# Patient Record
Sex: Male | Born: 1935 | Race: White | Hispanic: No | Marital: Married | Smoking: Former smoker
Health system: Southern US, Community
[De-identification: ages and names within clinical notes are randomized; demographics above are authoritative.]

## PROBLEM LIST (undated history)

## (undated) DIAGNOSIS — F32A Depression, unspecified: Secondary | ICD-10-CM

## (undated) DIAGNOSIS — IMO0002 Reserved for concepts with insufficient information to code with codable children: Secondary | ICD-10-CM

## (undated) DIAGNOSIS — R972 Elevated prostate specific antigen [PSA]: Secondary | ICD-10-CM

## (undated) DIAGNOSIS — I10 Essential (primary) hypertension: Secondary | ICD-10-CM

## (undated) DIAGNOSIS — E785 Hyperlipidemia, unspecified: Secondary | ICD-10-CM

## (undated) DIAGNOSIS — I219 Acute myocardial infarction, unspecified: Secondary | ICD-10-CM

## (undated) DIAGNOSIS — F329 Major depressive disorder, single episode, unspecified: Secondary | ICD-10-CM

## (undated) DIAGNOSIS — F419 Anxiety disorder, unspecified: Secondary | ICD-10-CM

## (undated) DIAGNOSIS — I2510 Atherosclerotic heart disease of native coronary artery without angina pectoris: Secondary | ICD-10-CM

## (undated) DIAGNOSIS — R011 Cardiac murmur, unspecified: Secondary | ICD-10-CM

## (undated) DIAGNOSIS — T7840XA Allergy, unspecified, initial encounter: Secondary | ICD-10-CM

## (undated) HISTORY — DX: Reserved for concepts with insufficient information to code with codable children: IMO0002

## (undated) HISTORY — DX: Cardiac murmur, unspecified: R01.1

## (undated) HISTORY — DX: Depression, unspecified: F32.A

## (undated) HISTORY — DX: Allergy, unspecified, initial encounter: T78.40XA

## (undated) HISTORY — DX: Elevated prostate specific antigen (PSA): R97.20

## (undated) HISTORY — DX: Hyperlipidemia, unspecified: E78.5

## (undated) HISTORY — PX: CARDIAC CATHETERIZATION: SHX172

## (undated) HISTORY — DX: Essential (primary) hypertension: I10

## (undated) HISTORY — DX: Major depressive disorder, single episode, unspecified: F32.9

## (undated) HISTORY — DX: Anxiety disorder, unspecified: F41.9

## (undated) HISTORY — DX: Atherosclerotic heart disease of native coronary artery without angina pectoris: I25.10

## (undated) HISTORY — DX: Acute myocardial infarction, unspecified: I21.9

---
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
## 1999-08-16 HISTORY — PX: CHOLECYSTECTOMY: SHX55

## 2003-08-16 HISTORY — PX: CORONARY ANGIOPLASTY: SHX604

## 2007-08-16 DIAGNOSIS — I219 Acute myocardial infarction, unspecified: Secondary | ICD-10-CM

## 2007-08-16 HISTORY — DX: Acute myocardial infarction, unspecified: I21.9

## 2007-08-16 HISTORY — PX: CORONARY ARTERY BYPASS GRAFT: SHX141

## 2008-01-28 ENCOUNTER — Other Ambulatory Visit: Payer: Self-pay

## 2008-01-28 ENCOUNTER — Inpatient Hospital Stay: Payer: Self-pay | Admitting: Cardiovascular Disease

## 2008-03-19 ENCOUNTER — Encounter: Payer: Self-pay | Admitting: Cardiology

## 2008-04-15 ENCOUNTER — Encounter: Payer: Self-pay | Admitting: Cardiology

## 2008-05-15 ENCOUNTER — Encounter: Payer: Self-pay | Admitting: Cardiology

## 2008-06-15 ENCOUNTER — Encounter: Payer: Self-pay | Admitting: Cardiology

## 2011-01-14 LAB — HM COLONOSCOPY

## 2011-08-24 DIAGNOSIS — R3129 Other microscopic hematuria: Secondary | ICD-10-CM | POA: Diagnosis not present

## 2011-08-24 DIAGNOSIS — Z79899 Other long term (current) drug therapy: Secondary | ICD-10-CM | POA: Diagnosis not present

## 2011-08-24 DIAGNOSIS — E7800 Pure hypercholesterolemia, unspecified: Secondary | ICD-10-CM | POA: Diagnosis not present

## 2011-08-24 DIAGNOSIS — Z125 Encounter for screening for malignant neoplasm of prostate: Secondary | ICD-10-CM | POA: Diagnosis not present

## 2011-08-26 DIAGNOSIS — Z1211 Encounter for screening for malignant neoplasm of colon: Secondary | ICD-10-CM | POA: Diagnosis not present

## 2011-08-26 DIAGNOSIS — Z0000 Encounter for general adult medical examination without abnormal findings: Secondary | ICD-10-CM | POA: Diagnosis not present

## 2011-08-26 DIAGNOSIS — E7800 Pure hypercholesterolemia, unspecified: Secondary | ICD-10-CM | POA: Diagnosis not present

## 2011-08-26 DIAGNOSIS — Z951 Presence of aortocoronary bypass graft: Secondary | ICD-10-CM | POA: Diagnosis not present

## 2011-08-26 DIAGNOSIS — N529 Male erectile dysfunction, unspecified: Secondary | ICD-10-CM | POA: Diagnosis not present

## 2011-08-26 DIAGNOSIS — R972 Elevated prostate specific antigen [PSA]: Secondary | ICD-10-CM | POA: Diagnosis not present

## 2011-08-26 DIAGNOSIS — Z125 Encounter for screening for malignant neoplasm of prostate: Secondary | ICD-10-CM | POA: Diagnosis not present

## 2011-09-07 DIAGNOSIS — N401 Enlarged prostate with lower urinary tract symptoms: Secondary | ICD-10-CM | POA: Diagnosis not present

## 2011-09-07 DIAGNOSIS — R972 Elevated prostate specific antigen [PSA]: Secondary | ICD-10-CM | POA: Diagnosis not present

## 2011-09-07 DIAGNOSIS — N138 Other obstructive and reflux uropathy: Secondary | ICD-10-CM | POA: Diagnosis not present

## 2011-12-12 DIAGNOSIS — H1045 Other chronic allergic conjunctivitis: Secondary | ICD-10-CM | POA: Diagnosis not present

## 2012-02-27 DIAGNOSIS — D485 Neoplasm of uncertain behavior of skin: Secondary | ICD-10-CM | POA: Diagnosis not present

## 2012-02-27 DIAGNOSIS — C44319 Basal cell carcinoma of skin of other parts of face: Secondary | ICD-10-CM | POA: Diagnosis not present

## 2012-03-07 DIAGNOSIS — N138 Other obstructive and reflux uropathy: Secondary | ICD-10-CM | POA: Diagnosis not present

## 2012-03-07 DIAGNOSIS — N401 Enlarged prostate with lower urinary tract symptoms: Secondary | ICD-10-CM | POA: Diagnosis not present

## 2012-03-07 DIAGNOSIS — R972 Elevated prostate specific antigen [PSA]: Secondary | ICD-10-CM | POA: Diagnosis not present

## 2012-03-15 DIAGNOSIS — C44319 Basal cell carcinoma of skin of other parts of face: Secondary | ICD-10-CM | POA: Diagnosis not present

## 2012-05-29 DIAGNOSIS — Z23 Encounter for immunization: Secondary | ICD-10-CM | POA: Diagnosis not present

## 2012-08-17 DIAGNOSIS — M25559 Pain in unspecified hip: Secondary | ICD-10-CM | POA: Diagnosis not present

## 2012-09-05 DIAGNOSIS — R972 Elevated prostate specific antigen [PSA]: Secondary | ICD-10-CM | POA: Diagnosis not present

## 2012-09-05 DIAGNOSIS — N138 Other obstructive and reflux uropathy: Secondary | ICD-10-CM | POA: Diagnosis not present

## 2012-09-05 DIAGNOSIS — N401 Enlarged prostate with lower urinary tract symptoms: Secondary | ICD-10-CM | POA: Diagnosis not present

## 2012-10-01 ENCOUNTER — Observation Stay: Payer: Self-pay | Admitting: Specialist

## 2012-10-01 DIAGNOSIS — Z7982 Long term (current) use of aspirin: Secondary | ICD-10-CM | POA: Diagnosis not present

## 2012-10-01 DIAGNOSIS — I10 Essential (primary) hypertension: Secondary | ICD-10-CM | POA: Diagnosis not present

## 2012-10-01 DIAGNOSIS — I2510 Atherosclerotic heart disease of native coronary artery without angina pectoris: Secondary | ICD-10-CM | POA: Diagnosis not present

## 2012-10-01 DIAGNOSIS — I200 Unstable angina: Secondary | ICD-10-CM | POA: Diagnosis not present

## 2012-10-01 DIAGNOSIS — R0789 Other chest pain: Secondary | ICD-10-CM | POA: Diagnosis not present

## 2012-10-01 DIAGNOSIS — F329 Major depressive disorder, single episode, unspecified: Secondary | ICD-10-CM | POA: Diagnosis not present

## 2012-10-01 DIAGNOSIS — Z951 Presence of aortocoronary bypass graft: Secondary | ICD-10-CM | POA: Diagnosis not present

## 2012-10-01 DIAGNOSIS — E785 Hyperlipidemia, unspecified: Secondary | ICD-10-CM | POA: Diagnosis not present

## 2012-10-01 DIAGNOSIS — R079 Chest pain, unspecified: Secondary | ICD-10-CM | POA: Diagnosis not present

## 2012-10-01 DIAGNOSIS — Z8249 Family history of ischemic heart disease and other diseases of the circulatory system: Secondary | ICD-10-CM | POA: Diagnosis not present

## 2012-10-01 DIAGNOSIS — Z79899 Other long term (current) drug therapy: Secondary | ICD-10-CM | POA: Diagnosis not present

## 2012-10-01 DIAGNOSIS — K219 Gastro-esophageal reflux disease without esophagitis: Secondary | ICD-10-CM | POA: Diagnosis not present

## 2012-10-01 LAB — PROTIME-INR
INR: 1
Prothrombin Time: 13.3 secs (ref 11.5–14.7)

## 2012-10-01 LAB — COMPREHENSIVE METABOLIC PANEL
Albumin: 3.9 g/dL (ref 3.4–5.0)
Alkaline Phosphatase: 80 U/L (ref 50–136)
Anion Gap: 6 — ABNORMAL LOW (ref 7–16)
BUN: 20 mg/dL — ABNORMAL HIGH (ref 7–18)
Bilirubin,Total: 0.5 mg/dL (ref 0.2–1.0)
Calcium, Total: 8.9 mg/dL (ref 8.5–10.1)
Chloride: 107 mmol/L (ref 98–107)
Co2: 26 mmol/L (ref 21–32)
Creatinine: 1.12 mg/dL (ref 0.60–1.30)
EGFR (African American): >60
EGFR (Non-African Amer.): >60
Glucose: 92 mg/dL (ref 65–99)
Osmolality: 280 (ref 275–301)
Potassium: 4.4 mmol/L (ref 3.5–5.1)
SGOT(AST): 30 U/L (ref 15–37)
SGPT (ALT): 27 U/L (ref 12–78)
Sodium: 139 mmol/L (ref 136–145)
Total Protein: 7.8 g/dL (ref 6.4–8.2)

## 2012-10-01 LAB — LIPID PANEL
Cholesterol: 148 mg/dL (ref 0–200)
HDL Cholesterol: 49 mg/dL (ref 40–60)
Ldl Cholesterol, Calc: 81 mg/dL (ref 0–100)
Triglycerides: 89 mg/dL (ref 0–200)
VLDL Cholesterol, Calc: 18 mg/dL (ref 5–40)

## 2012-10-01 LAB — APTT: Activated PTT: 29.6 secs (ref 23.6–35.9)

## 2012-10-01 LAB — CBC
HCT: 40 % (ref 40.0–52.0)
HGB: 13.2 g/dL (ref 13.0–18.0)
MCH: 31.8 pg (ref 26.0–34.0)
MCHC: 32.9 g/dL (ref 32.0–36.0)
MCV: 97 fL (ref 80–100)
Platelet: 266 10*3/uL (ref 150–440)
RBC: 4.13 10*6/uL — ABNORMAL LOW (ref 4.40–5.90)
RDW: 13.4 % (ref 11.5–14.5)
WBC: 10.2 10*3/uL (ref 3.8–10.6)

## 2012-10-01 LAB — CK TOTAL AND CKMB (NOT AT ARMC)
CK, Total: 138 U/L (ref 35–232)
CK-MB: 3.6 ng/mL (ref 0.5–3.6)

## 2012-10-01 LAB — TROPONIN I
Troponin-I: <0.02 ng/mL
Troponin-I: <0.02 ng/mL

## 2012-10-02 ENCOUNTER — Telehealth: Payer: Self-pay

## 2012-10-02 DIAGNOSIS — E785 Hyperlipidemia, unspecified: Secondary | ICD-10-CM | POA: Diagnosis not present

## 2012-10-02 DIAGNOSIS — I200 Unstable angina: Secondary | ICD-10-CM | POA: Diagnosis not present

## 2012-10-02 DIAGNOSIS — I10 Essential (primary) hypertension: Secondary | ICD-10-CM | POA: Diagnosis not present

## 2012-10-02 DIAGNOSIS — R079 Chest pain, unspecified: Secondary | ICD-10-CM | POA: Diagnosis not present

## 2012-10-02 DIAGNOSIS — I2510 Atherosclerotic heart disease of native coronary artery without angina pectoris: Secondary | ICD-10-CM | POA: Diagnosis not present

## 2012-10-02 LAB — BASIC METABOLIC PANEL
Anion Gap: 7 (ref 7–16)
BUN: 18 mg/dL (ref 7–18)
Calcium, Total: 8.7 mg/dL (ref 8.5–10.1)
Chloride: 108 mmol/L — ABNORMAL HIGH (ref 98–107)
Co2: 27 mmol/L (ref 21–32)
Creatinine: 1.17 mg/dL (ref 0.60–1.30)
EGFR (African American): >60
EGFR (Non-African Amer.): >60
Glucose: 101 mg/dL — ABNORMAL HIGH (ref 65–99)
Osmolality: 285 (ref 275–301)
Potassium: 4.1 mmol/L (ref 3.5–5.1)
Sodium: 142 mmol/L (ref 136–145)

## 2012-10-02 LAB — TROPONIN I: Troponin-I: <0.02 ng/mL

## 2012-10-02 LAB — CBC WITH DIFFERENTIAL/PLATELET
Basophil #: 0.1 10*3/uL (ref 0.0–0.1)
Basophil %: 1 %
Eosinophil #: 0.6 10*3/uL (ref 0.0–0.7)
Eosinophil %: 7.7 %
HCT: 38.4 % — ABNORMAL LOW (ref 40.0–52.0)
HGB: 12.8 g/dL — ABNORMAL LOW (ref 13.0–18.0)
Lymphocyte #: 1.5 10*3/uL (ref 1.0–3.6)
Lymphocyte %: 20 %
MCH: 32.2 pg (ref 26.0–34.0)
MCHC: 33.4 g/dL (ref 32.0–36.0)
MCV: 97 fL (ref 80–100)
Monocyte #: 0.6 x10 3/mm (ref 0.2–1.0)
Monocyte %: 7.4 %
Neutrophil #: 4.9 10*3/uL (ref 1.4–6.5)
Neutrophil %: 63.9 %
Platelet: 249 10*3/uL (ref 150–440)
RBC: 3.98 10*6/uL — ABNORMAL LOW (ref 4.40–5.90)
RDW: 13.2 % (ref 11.5–14.5)
WBC: 7.6 10*3/uL (ref 3.8–10.6)

## 2012-10-02 LAB — CK TOTAL AND CKMB (NOT AT ARMC)
CK, Total: 135 U/L (ref 35–232)
CK-MB: 2.8 ng/mL (ref 0.5–3.6)

## 2012-10-02 NOTE — Telephone Encounter (Signed)
TCM call

## 2012-10-02 NOTE — Telephone Encounter (Signed)
Message copied by Marcelle Overlie on Tue Oct 02, 2012  4:04 PM ------      Message from: Thersa Salt      Created: Tue Oct 02, 2012  1:36 PM      Regarding: tcm        Pt has appt 10/12/12 ------

## 2012-10-03 NOTE — Telephone Encounter (Signed)
TCM call Pt says he is feeling well post d./c for CP Denies further CP, SOB Confirms compliance with meds as prescribed Confirms knowledge of appt with [place] 2/28

## 2012-10-11 ENCOUNTER — Encounter: Payer: Self-pay | Admitting: Cardiovascular Disease

## 2012-10-11 ENCOUNTER — Ambulatory Visit (INDEPENDENT_AMBULATORY_CARE_PROVIDER_SITE_OTHER): Payer: Medicare PPO | Admitting: Cardiovascular Disease

## 2012-10-11 VITALS — BP 150/60 | HR 65 | Ht 73.0 in | Wt 197.0 lb

## 2012-10-11 DIAGNOSIS — E782 Mixed hyperlipidemia: Secondary | ICD-10-CM | POA: Insufficient documentation

## 2012-10-11 DIAGNOSIS — R0789 Other chest pain: Secondary | ICD-10-CM

## 2012-10-11 DIAGNOSIS — Z951 Presence of aortocoronary bypass graft: Secondary | ICD-10-CM | POA: Insufficient documentation

## 2012-10-11 DIAGNOSIS — I10 Essential (primary) hypertension: Secondary | ICD-10-CM | POA: Insufficient documentation

## 2012-10-11 DIAGNOSIS — I2510 Atherosclerotic heart disease of native coronary artery without angina pectoris: Secondary | ICD-10-CM | POA: Insufficient documentation

## 2012-10-11 DIAGNOSIS — E785 Hyperlipidemia, unspecified: Secondary | ICD-10-CM

## 2012-10-11 NOTE — Progress Notes (Signed)
Patient ID: Rodney Delacruz, male    DOB: 05-Oct-1933, 79 y.o.   MRN: [id number]  HPI Comments: 79 year old male with coronary artery disease, prior PCI and bypass surgery in 2008 at [facility], presenting to [facility] for chest pain on 10/30/2012. He ruled out for MI and had a Myoview study that showed no ischemia. He presents today for followup.he reports having a history of GERD.   Since his discharge, he has continued to haveSome dullness in his chest, described as a pressure. Typically occurs at rest, not with exertion. He does do exercise on a regular basis with no symptoms. Chest is somewhat tender to palpation sometimes.uncertain if symptoms are from GERD, cardiac or something else  Total cholesterol 148, LDL 81, HDL 49  Cardiac catheterization June 2009 The shows 90% distal left main disease, 70% ostial LAD disease, 100% mid LAD at the site of a prior stent, 100% distal LAD disease at the site of prior stent, D1 with 60% disease, proximal circumflex with 40% disease, mid circumflex with 50%, OM1 with 30% disease, proximal RCA with 30% followed by 40% followed by 60% disease, 50% distal PDA disease  EKG shows normal sinus rhythm with rate 65 beats per minute with no significant ST or T wave changes  Outpatient Encounter Prescriptions as of 10/11/2012  Medication Sig Dispense Refill  . aspirin 81 MG tablet Take 81 mg by mouth daily.      Marland Kitchen buPROPion (WELLBUTRIN XL) 300 MG 24 hr tablet Take 300 mg by mouth daily.      . lansoprazole (PREVACID) 30 MG capsule Take 30 mg by mouth daily.      Marland Kitchen lovastatin (MEVACOR) 40 MG tablet Take 40 mg by mouth at bedtime.      . meloxicam (MOBIC) 15 MG tablet Take 15 mg by mouth 2 (two) times daily.       . metoprolol succinate (TOPROL-XL) 25 MG 24 hr tablet Take 25 mg by mouth daily.      . Multiple Vitamin (MULTI-VITAMIN DAILY PO) Take by mouth.      . nitroGLYCERIN (NITROSTAT) 0.4 MG SL tablet Place 0.4 mg under the tongue every 5 (five) minutes as needed for  chest pain.      . ramipril (ALTACE) 2.5 MG capsule Take 2.5 mg by mouth daily.       No facility-administered encounter medications on file as of 10/11/2012.      Review of Systems  Constitutional: Negative.   HENT: Negative.   Eyes: Negative.   Respiratory: Positive for chest tightness.        At rest  Cardiovascular: Negative.   Gastrointestinal: Negative.   Musculoskeletal: Negative.   Skin: Negative.   Neurological: Negative.   Psychiatric/Behavioral: Negative.   All other systems reviewed and are negative.    BP 150/60  Pulse 65  Ht 6\' 1"  (1.854 m)  Wt 197 lb (89.359 kg)  BMI 26 kg/m2 He reports blood pressure is better controlled at home with systolic in the 130s  Physical Exam  Nursing note and vitals reviewed. Constitutional: He is oriented to person, place, and time. He appears well-developed and well-nourished.  HENT:  Head: Normocephalic.  Nose: Nose normal.  Mouth/Throat: Oropharynx is clear and moist.  Eyes: Conjunctivae are normal. Pupils are equal, round, and reactive to light.  Neck: Normal range of motion. Neck supple. No JVD present.  Cardiovascular: Normal rate, regular rhythm, S1 normal, S2 normal, normal heart sounds and intact distal pulses.  Exam reveals no  gallop and no friction rub.   No murmur heard. Pulmonary/Chest: Effort normal and breath sounds normal. No respiratory distress. He has no wheezes. He has no rales. He exhibits no tenderness.  Abdominal: Soft. Bowel sounds are normal. He exhibits no distension. There is no tenderness.  Musculoskeletal: Normal range of motion. He exhibits no edema and no tenderness.  Lymphadenopathy:    He has no cervical adenopathy.  Neurological: He is alert and oriented to person, place, and time. Coordination normal.  Skin: Skin is warm and dry. No rash noted. No erythema.  Psychiatric: He has a normal mood and affect. His behavior is normal. Judgment and thought content normal.      Assessment and  Plan

## 2012-10-11 NOTE — Assessment & Plan Note (Signed)
Three-vessel disease as detailed above

## 2012-10-11 NOTE — Assessment & Plan Note (Signed)
Severe three-vessel disease by cardiac catheterization notes dated June 2009. Recent negative stress test. Continue aggressive cholesterol management

## 2012-10-11 NOTE — Patient Instructions (Addendum)
You are doing well. Please try extra pepcid or zantac as needed for stomach Ok to take two of the prevacid  Please call us if you have new issues that need to be addressed before your next appt.  Your physician wants you to follow-up in: 6 months.  You will receive a reminder letter in the mail two months in advance. If you don't receive a letter, please call our office to schedule the follow-up appointment.

## 2012-10-11 NOTE — Assessment & Plan Note (Signed)
Severe three-vessel disease. Bypass surgery in 2008. Recent negative stress test. Continue aggressive cholesterol management.

## 2012-10-11 NOTE — Assessment & Plan Note (Signed)
Chest tightness is atypical in nature, comes on at rest. Recent negative stress test. We have recommended he increase his proton pump inhibitor to twice a day dosing. Also supplement with Zantac and TUMS. He could try nonsteroidal anti-inflammatories for possible costochondritis.

## 2012-10-11 NOTE — Assessment & Plan Note (Addendum)
Cholesterol at or close to goal on current medication regimen.

## 2012-10-12 ENCOUNTER — Encounter: Payer: Medicare PPO | Admitting: Cardiovascular Disease

## 2012-10-12 ENCOUNTER — Encounter: Payer: Self-pay | Admitting: Cardiovascular Disease

## 2012-10-22 ENCOUNTER — Other Ambulatory Visit: Payer: Self-pay | Admitting: *Deleted

## 2012-10-22 DIAGNOSIS — R972 Elevated prostate specific antigen [PSA]: Secondary | ICD-10-CM | POA: Diagnosis not present

## 2012-10-22 MED ORDER — RAMIPRIL 2.5 MG PO CAPS: 2.5000 mg | ORAL_CAPSULE | Freq: Every day | ORAL | Status: DC

## 2012-10-22 MED ORDER — LANSOPRAZOLE 30 MG PO CPDR: 30.0000 mg | DELAYED_RELEASE_CAPSULE | Freq: Every day | ORAL | Status: DC

## 2012-10-22 MED ORDER — NITROGLYCERIN 0.4 MG SL SUBL: 0.4000 mg | SUBLINGUAL_TABLET | SUBLINGUAL | Status: DC | PRN

## 2012-10-22 MED ORDER — METOPROLOL SUCCINATE ER 25 MG PO TB24: 25.0000 mg | ORAL_TABLET | Freq: Every day | ORAL | Status: DC

## 2012-10-22 MED ORDER — LOVASTATIN 40 MG PO TABS: 40.0000 mg | ORAL_TABLET | Freq: Every day | ORAL | Status: DC

## 2012-10-22 NOTE — Telephone Encounter (Signed)
Refilled Metoprolol, Nitro, Ramiprel, Lovastatin and Prevacid sent to [facility].

## 2012-11-01 DIAGNOSIS — I2510 Atherosclerotic heart disease of native coronary artery without angina pectoris: Secondary | ICD-10-CM | POA: Diagnosis not present

## 2012-11-01 DIAGNOSIS — Z951 Presence of aortocoronary bypass graft: Secondary | ICD-10-CM | POA: Diagnosis not present

## 2012-11-01 DIAGNOSIS — R0789 Other chest pain: Secondary | ICD-10-CM | POA: Diagnosis not present

## 2012-11-01 DIAGNOSIS — E785 Hyperlipidemia, unspecified: Secondary | ICD-10-CM | POA: Diagnosis not present

## 2012-11-01 DIAGNOSIS — I10 Essential (primary) hypertension: Secondary | ICD-10-CM

## 2012-11-05 DIAGNOSIS — H43819 Vitreous degeneration, unspecified eye: Secondary | ICD-10-CM | POA: Diagnosis not present

## 2012-11-12 DIAGNOSIS — H33309 Unspecified retinal break, unspecified eye: Secondary | ICD-10-CM | POA: Diagnosis not present

## 2012-11-23 DIAGNOSIS — M76899 Other specified enthesopathies of unspecified lower limb, excluding foot: Secondary | ICD-10-CM | POA: Diagnosis not present

## 2012-12-10 DIAGNOSIS — H33309 Unspecified retinal break, unspecified eye: Secondary | ICD-10-CM | POA: Diagnosis not present

## 2013-01-09 DIAGNOSIS — L570 Actinic keratosis: Secondary | ICD-10-CM | POA: Diagnosis not present

## 2013-01-09 DIAGNOSIS — C44319 Basal cell carcinoma of skin of other parts of face: Secondary | ICD-10-CM | POA: Diagnosis not present

## 2013-01-09 DIAGNOSIS — D485 Neoplasm of uncertain behavior of skin: Secondary | ICD-10-CM | POA: Diagnosis not present

## 2013-01-16 DIAGNOSIS — F331 Major depressive disorder, recurrent, moderate: Secondary | ICD-10-CM | POA: Diagnosis not present

## 2013-01-24 DIAGNOSIS — C44319 Basal cell carcinoma of skin of other parts of face: Secondary | ICD-10-CM | POA: Diagnosis not present

## 2013-02-18 DIAGNOSIS — H33309 Unspecified retinal break, unspecified eye: Secondary | ICD-10-CM | POA: Diagnosis not present

## 2013-03-06 DIAGNOSIS — R972 Elevated prostate specific antigen [PSA]: Secondary | ICD-10-CM | POA: Diagnosis not present

## 2013-03-06 DIAGNOSIS — N401 Enlarged prostate with lower urinary tract symptoms: Secondary | ICD-10-CM | POA: Diagnosis not present

## 2013-03-06 DIAGNOSIS — N529 Male erectile dysfunction, unspecified: Secondary | ICD-10-CM | POA: Diagnosis not present

## 2013-03-06 DIAGNOSIS — N138 Other obstructive and reflux uropathy: Secondary | ICD-10-CM | POA: Diagnosis not present

## 2013-03-20 ENCOUNTER — Other Ambulatory Visit: Payer: Self-pay

## 2013-04-29 DIAGNOSIS — C44319 Basal cell carcinoma of skin of other parts of face: Secondary | ICD-10-CM | POA: Diagnosis not present

## 2013-04-29 DIAGNOSIS — D485 Neoplasm of uncertain behavior of skin: Secondary | ICD-10-CM | POA: Diagnosis not present

## 2013-05-17 ENCOUNTER — Other Ambulatory Visit: Payer: Self-pay | Admitting: *Deleted

## 2013-05-17 ENCOUNTER — Other Ambulatory Visit: Payer: Self-pay | Admitting: Cardiovascular Disease

## 2013-05-17 MED ORDER — LANSOPRAZOLE 30 MG PO CPDR: 30.0000 mg | DELAYED_RELEASE_CAPSULE | Freq: Every day | ORAL | Status: DC

## 2013-05-17 MED ORDER — LOVASTATIN 40 MG PO TABS: 40.0000 mg | ORAL_TABLET | Freq: Every day | ORAL | Status: DC

## 2013-05-17 NOTE — Telephone Encounter (Signed)
Refilled mevacor and prevacid sent to [facility] pharmacy.

## 2013-05-27 DIAGNOSIS — H43819 Vitreous degeneration, unspecified eye: Secondary | ICD-10-CM | POA: Diagnosis not present

## 2013-06-03 ENCOUNTER — Other Ambulatory Visit: Payer: Self-pay | Admitting: Cardiovascular Disease

## 2013-06-03 DIAGNOSIS — Z23 Encounter for immunization: Secondary | ICD-10-CM | POA: Diagnosis not present

## 2013-06-17 ENCOUNTER — Ambulatory Visit (INDEPENDENT_AMBULATORY_CARE_PROVIDER_SITE_OTHER): Payer: Medicare Other | Admitting: Cardiovascular Disease

## 2013-06-17 ENCOUNTER — Encounter: Payer: Self-pay | Admitting: Cardiovascular Disease

## 2013-06-17 VITALS — BP 132/60 | HR 60 | Ht 73.0 in | Wt 196.5 lb

## 2013-06-17 DIAGNOSIS — I10 Essential (primary) hypertension: Secondary | ICD-10-CM | POA: Diagnosis not present

## 2013-06-17 DIAGNOSIS — E785 Hyperlipidemia, unspecified: Secondary | ICD-10-CM

## 2013-06-17 DIAGNOSIS — Z951 Presence of aortocoronary bypass graft: Secondary | ICD-10-CM | POA: Diagnosis not present

## 2013-06-17 DIAGNOSIS — I2510 Atherosclerotic heart disease of native coronary artery without angina pectoris: Secondary | ICD-10-CM | POA: Diagnosis not present

## 2013-06-17 DIAGNOSIS — R079 Chest pain, unspecified: Secondary | ICD-10-CM | POA: Diagnosis not present

## 2013-06-17 MED ORDER — RAMIPRIL 2.5 MG PO CAPS: 2.5000 mg | ORAL_CAPSULE | Freq: Every day | ORAL | Status: DC

## 2013-06-17 MED ORDER — LOVASTATIN 40 MG PO TABS: 40.0000 mg | ORAL_TABLET | Freq: Every day | ORAL | Status: DC

## 2013-06-17 MED ORDER — METOPROLOL SUCCINATE ER 25 MG PO TB24: 25.0000 mg | ORAL_TABLET | Freq: Every day | ORAL | Status: DC

## 2013-06-17 MED ORDER — OMEPRAZOLE 20 MG PO CPDR: 20.0000 mg | DELAYED_RELEASE_CAPSULE | Freq: Every day | ORAL | Status: DC

## 2013-06-17 NOTE — Progress Notes (Signed)
Patient ID: Rodney Delacruz, male    DOB: [birth year], 79 y.o.   MRN: [id number]  HPI Comments: 79 year old male with coronary artery disease, prior PCI and bypass surgery in 2008 at [facility], presenting to [facility] for chest pain on 10/30/2012. He ruled out for MI and had a Myoview study that showed no ischemia. He presents today for followup.he reports having a history of GERD.   On his last clinic visit and again today, he reports that he is doing well but does have occasional chest discomfort at rest. He walks 1 mile per day for exercise and typically has no symptoms of shortness of breath or chest pain. He wonders if his chest pain could be from his previous bypass, surgical issue. Somewhat tender to palpation  Total cholesterol 148, LDL 81, HDL 49  Cardiac catheterization June 2009 The shows 90% distal left main disease, 70% ostial LAD disease, 100% mid LAD at the site of a prior stent, 100% distal LAD disease at the site of prior stent, D1 with 60% disease, proximal circumflex with 40% disease, mid circumflex with 50%, OM1 with 30% disease, proximal RCA with 30% followed by 40% followed by 60% disease, 50% distal PDA disease  EKG shows normal sinus rhythm with rate 60 beats per minute with no significant ST or T wave changes  Outpatient Encounter Prescriptions as of 06/17/2013  Medication Sig  . aspirin 81 MG tablet Take 81 mg by mouth daily.  Marland Kitchen buPROPion (WELLBUTRIN XL) 300 MG 24 hr tablet Take 300 mg by mouth daily.  . lansoprazole (PREVACID) 30 MG capsule Take 1 capsule (30 mg total) by mouth daily.  Marland Kitchen lovastatin (MEVACOR) 40 MG tablet Take 1 tablet (40 mg total) by mouth at bedtime.  . metoprolol succinate (TOPROL-XL) 25 MG 24 hr tablet Take 1 tablet (25 mg total) by mouth daily.  . Multiple Vitamin (MULTI-VITAMIN DAILY PO) Take by mouth.  . nitroGLYCERIN (NITROSTAT) 0.4 MG SL tablet Place 1 tablet (0.4 mg total) under the tongue every 5 (five) minutes as needed for chest pain.  .  ramipril (ALTACE) 2.5 MG capsule Take 1 capsule (2.5 mg total) by mouth daily.  . [DISCONTINUED] meloxicam (MOBIC) 15 MG tablet Take 15 mg by mouth 2 (two) times daily.       Review of Systems  Constitutional: Negative.   HENT: Negative.   Eyes: Negative.   Respiratory: Positive for chest tightness.        At rest  Cardiovascular: Negative.   Gastrointestinal: Negative.   Musculoskeletal: Negative.   Skin: Negative.   Neurological: Negative.   Psychiatric/Behavioral: Negative.   All other systems reviewed and are negative.    BP 132/60  Pulse 60  Ht 6\' 1"  (1.854 m)  Wt 196 lb 8 oz (89.132 kg)  BMI 25.93 kg/m2  Physical Exam  Nursing note and vitals reviewed. Constitutional: He is oriented to person, place, and time. He appears well-developed and well-nourished.  HENT:  Head: Normocephalic.  Nose: Nose normal.  Mouth/Throat: Oropharynx is clear and moist.  Eyes: Conjunctivae are normal. Pupils are equal, round, and reactive to light.  Neck: Normal range of motion. Neck supple. No JVD present.  Cardiovascular: Normal rate, regular rhythm, S1 normal, S2 normal, normal heart sounds and intact distal pulses.  Exam reveals no gallop and no friction rub.   No murmur heard. Pulmonary/Chest: Effort normal and breath sounds normal. No respiratory distress. He has no wheezes. He has no rales. He exhibits no tenderness.  Abdominal:  Soft. Bowel sounds are normal. He exhibits no distension. There is no tenderness.  Musculoskeletal: Normal range of motion. He exhibits no edema and no tenderness.  Lymphadenopathy:    He has no cervical adenopathy.  Neurological: He is alert and oriented to person, place, and time. Coordination normal.  Skin: Skin is warm and dry. No rash noted. No erythema.  Psychiatric: He has a normal mood and affect. His behavior is normal. Judgment and thought content normal.      Assessment and Plan

## 2013-06-17 NOTE — Assessment & Plan Note (Signed)
Cholesterol is at goal on the current lipid regimen. No changes to the medications were made.  

## 2013-06-17 NOTE — Assessment & Plan Note (Signed)
Possible noncardiac chest discomfort, musculoskeletal in nature. No symptoms with exertion

## 2013-06-17 NOTE — Patient Instructions (Addendum)
You are doing well. No medication changes were made.  Please call us if you have new issues that need to be addressed before your next appt.  Your physician wants you to follow-up in: 12 months.  You will receive a reminder letter in the mail two months in advance. If you don't receive a letter, please call our office to schedule the follow-up appointment. 

## 2013-06-17 NOTE — Assessment & Plan Note (Signed)
Blood pressure is well controlled on today's visit. No changes made to the medications. 

## 2013-06-17 NOTE — Assessment & Plan Note (Signed)
Currently with no symptoms of angina. No further workup at this time. Continue current medication regimen. 

## 2013-06-19 ENCOUNTER — Other Ambulatory Visit: Payer: Self-pay

## 2013-06-19 ENCOUNTER — Encounter: Payer: Self-pay | Admitting: Cardiovascular Disease

## 2013-06-20 ENCOUNTER — Other Ambulatory Visit: Payer: Self-pay

## 2013-07-16 DIAGNOSIS — C44319 Basal cell carcinoma of skin of other parts of face: Secondary | ICD-10-CM | POA: Diagnosis not present

## 2013-07-16 DIAGNOSIS — Z85828 Personal history of other malignant neoplasm of skin: Secondary | ICD-10-CM | POA: Insufficient documentation

## 2013-08-12 ENCOUNTER — Encounter: Payer: Self-pay | Admitting: Adult Health

## 2013-08-12 ENCOUNTER — Ambulatory Visit (INDEPENDENT_AMBULATORY_CARE_PROVIDER_SITE_OTHER): Payer: Medicare Other | Admitting: Adult Health

## 2013-08-12 VITALS — BP 122/56 | HR 64 | Temp 98.1°F | Resp 12 | Ht 73.0 in | Wt 198.5 lb

## 2013-08-12 DIAGNOSIS — Z7189 Other specified counseling: Secondary | ICD-10-CM | POA: Diagnosis not present

## 2013-08-12 DIAGNOSIS — Z7689 Persons encountering health services in other specified circumstances: Secondary | ICD-10-CM

## 2013-08-12 DIAGNOSIS — J329 Chronic sinusitis, unspecified: Secondary | ICD-10-CM

## 2013-08-12 MED ORDER — FLUTICASONE PROPIONATE 50 MCG/ACT NA SUSP: 1.0000 | Freq: Every day | NASAL | Status: DC

## 2013-08-12 NOTE — Assessment & Plan Note (Signed)
Reviewed H&P, medications. Request medical records from previous PCP. Also seen by urology for elevated PSA. Request records. Medicare wellness in February.

## 2013-08-12 NOTE — Patient Instructions (Signed)
   Thank you for choosing [place] at [facility] for your health care needs.  Please schedule your Medicare Wellness exam for February 2015.  Feel free to call with any questions or concerns.  Have a wonderful New Year!

## 2013-08-12 NOTE — Progress Notes (Signed)
Pre visit review using our clinic review tool, if applicable. No additional management support is needed unless otherwise documented below in the visit note. 

## 2013-08-12 NOTE — Progress Notes (Signed)
Subjective:    Patient ID: Rodney Delacruz, male    DOB: 10-01-1933, 79 y.o.   MRN: [id number]  HPI  Patient is a very pleasant 79 y/o male who presents to clinic to establish care. Previously followed by Dr. Wilson Singer in [place]. He was referred to our clinic by his Cardiologist, Dr. Mariah Milling. Patient is overall feeling well. He is experiencing some post nasal drip, sinus pressure. Denies fever or chills. Has been using flonase.    Past Medical History  Diagnosis Date  . Coronary artery disease   . Hypertension   . Hyperlipidemia   . MI (myocardial infarction) 2009  . Anxiety   . Depression   . Ulcer   . Allergy   . Heart murmur   . Elevated PSA     Followed by Dr. Caralee Ates every 6 month     Past Surgical History  Procedure Laterality Date  . Coronary artery bypass graft  2009    [facility]  . Cardiac catheterization  2005, 2009  . Coronary angioplasty  2005    s/p stent placement @ [facility]  . Cholecystectomy  2001  . Tonsillectomy and adenoidectomy  1942     Family History  Problem Relation Age of Onset  . Heart attack Brother   . Heart disease Brother     CAD  . Alcohol abuse Father   . Diabetes Sister   . Alcohol abuse Paternal Grandfather   . Stroke Mother      History   Social History  . Marital Status: Married    Spouse Name: Rinaldo Cloud    Number of Children: 2  . Years of Education: 12   Occupational History  . 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 Smoking status: Former Smoker -- 1.00 packs/day for 20 years    Types: Cigarettes    Quit date: 08/15/1976  . Smokeless tobacco: Never Used  . Alcohol Use: Yes     Comment: Occasional  . Drug Use: No  . Sexual Activity: Not on file   Other Topics Concern  . Not on file   Social History Narrative   Mr. Tierce grew up in [place], [place]. He currently lives in [place] with his wife, Rinaldo Cloud, of 14 years. He was married previously to his high school sweetheart. They were  married for 33 years. He widowed in 1993. They had two son Marcial Pacas and Pharrell). Mr. Stuard sons live locally [facility] and [place]). He worked at [employer] and retired from [employer]. He was there for 20 years. He worked in [employer]. He enjoys fishing, reading, watching TV on his spare time. He also enjoys walking and visiting friends.     Review of Systems  Constitutional: Negative.  Negative for fever and chills.  HENT: Positive for postnasal drip, rhinorrhea and sinus pressure. Negative for congestion, sore throat and voice change.   Eyes: Negative.   Respiratory: Negative.   Cardiovascular: Negative.   Gastrointestinal: Negative.   Endocrine: Negative.   Genitourinary: Negative.   Musculoskeletal: Negative.   Skin: Negative.   Allergic/Immunologic: Negative.   Neurological: Negative.   Hematological: Negative.   Psychiatric/Behavioral: Negative.        Objective:   Physical Exam  Constitutional: He is oriented to person, place, and time. He appears well-developed and well-nourished. No distress.  HENT:  Head: Normocephalic and atraumatic.  Right Ear: External ear normal.  Left Ear: External ear normal.  Neck: Normal range of motion. Neck supple.  Cardiovascular: Normal rate, regular rhythm and normal heart sounds.  Exam reveals no gallop and no friction rub.   No murmur heard. Pulmonary/Chest: Effort normal and breath sounds normal. No respiratory distress. He has no wheezes. He has no rales.  Abdominal: Soft.  Musculoskeletal: Normal range of motion.  Neurological: He is alert and oriented to person, place, and time.  Skin: Skin is warm and dry.  Psychiatric: He has a normal mood and affect. His behavior is normal. Judgment and thought content normal.          Assessment & Plan:

## 2013-08-12 NOTE — Assessment & Plan Note (Signed)
Continue flonase. Saline nasal spray - may use liberally. If fever, chills or other bothersome symptom please notify office.

## 2013-09-04 DIAGNOSIS — R972 Elevated prostate specific antigen [PSA]: Secondary | ICD-10-CM | POA: Diagnosis not present

## 2013-09-04 DIAGNOSIS — N401 Enlarged prostate with lower urinary tract symptoms: Secondary | ICD-10-CM | POA: Diagnosis not present

## 2013-09-04 DIAGNOSIS — N138 Other obstructive and reflux uropathy: Secondary | ICD-10-CM | POA: Diagnosis not present

## 2013-09-04 DIAGNOSIS — N529 Male erectile dysfunction, unspecified: Secondary | ICD-10-CM | POA: Diagnosis not present

## 2013-10-21 ENCOUNTER — Encounter: Payer: Self-pay | Admitting: Adult Health

## 2013-10-21 ENCOUNTER — Ambulatory Visit (INDEPENDENT_AMBULATORY_CARE_PROVIDER_SITE_OTHER): Payer: Medicare Other | Admitting: Adult Health

## 2013-10-21 VITALS — BP 112/58 | HR 63 | Temp 98.4°F | Resp 14 | Wt 197.0 lb

## 2013-10-21 DIAGNOSIS — I10 Essential (primary) hypertension: Secondary | ICD-10-CM

## 2013-10-21 DIAGNOSIS — Z0000 Encounter for general adult medical examination without abnormal findings: Secondary | ICD-10-CM | POA: Insufficient documentation

## 2013-10-21 DIAGNOSIS — E785 Hyperlipidemia, unspecified: Secondary | ICD-10-CM

## 2013-10-21 DIAGNOSIS — E559 Vitamin D deficiency, unspecified: Secondary | ICD-10-CM

## 2013-10-21 DIAGNOSIS — Z23 Encounter for immunization: Secondary | ICD-10-CM | POA: Diagnosis not present

## 2013-10-21 DIAGNOSIS — E538 Deficiency of other specified B group vitamins: Secondary | ICD-10-CM

## 2013-10-21 LAB — CBC WITH DIFFERENTIAL/PLATELET
Basophils Absolute: 0.1 K/uL (ref 0.0–0.1)
Basophils Relative: 0.9 % (ref 0.0–3.0)
Eosinophils Absolute: 0.8 K/uL — ABNORMAL HIGH (ref 0.0–0.7)
Eosinophils Relative: 8.9 % — ABNORMAL HIGH (ref 0.0–5.0)
HCT: 37.9 % — ABNORMAL LOW (ref 39.0–52.0)
Hemoglobin: 12.5 g/dL — ABNORMAL LOW (ref 13.0–17.0)
Lymphocytes Relative: 17.7 % (ref 12.0–46.0)
Lymphs Abs: 1.5 K/uL (ref 0.7–4.0)
MCHC: 33 g/dL (ref 30.0–36.0)
MCV: 96.2 fl (ref 78.0–100.0)
Monocytes Absolute: 0.5 K/uL (ref 0.1–1.0)
Monocytes Relative: 5.3 % (ref 3.0–12.0)
Neutro Abs: 5.7 K/uL (ref 1.4–7.7)
Neutrophils Relative %: 67.2 % (ref 43.0–77.0)
Platelets: 356 K/uL (ref 150.0–400.0)
RBC: 3.94 Mil/uL — ABNORMAL LOW (ref 4.22–5.81)
RDW: 13.7 % (ref 11.5–14.6)
WBC: 8.5 K/uL (ref 4.5–10.5)

## 2013-10-21 LAB — COMPREHENSIVE METABOLIC PANEL
ALT: 18 U/L (ref 0–53)
AST: 21 U/L (ref 0–37)
Albumin: 3.8 g/dL (ref 3.5–5.2)
Alkaline Phosphatase: 62 U/L (ref 39–117)
BUN: 25 mg/dL — ABNORMAL HIGH (ref 6–23)
CO2: 28 mEq/L (ref 19–32)
Calcium: 9.2 mg/dL (ref 8.4–10.5)
Chloride: 107 mEq/L (ref 96–112)
Creatinine, Ser: 1.2 mg/dL (ref 0.4–1.5)
GFR: 59.97 mL/min — ABNORMAL LOW (ref >60.00)
Glucose, Bld: 97 mg/dL (ref 70–99)
Potassium: 5 mEq/L (ref 3.5–5.1)
Sodium: 140 mEq/L (ref 135–145)
Total Bilirubin: 0.6 mg/dL (ref 0.3–1.2)
Total Protein: 7.3 g/dL (ref 6.0–8.3)

## 2013-10-21 LAB — LIPID PANEL
Cholesterol: 182 mg/dL (ref 0–200)
HDL: 40.9 mg/dL (ref >39.00)
LDL Cholesterol: 128 mg/dL — ABNORMAL HIGH (ref 0–99)
Total CHOL/HDL Ratio: 4
Triglycerides: 64 mg/dL (ref 0.0–149.0)
VLDL: 12.8 mg/dL (ref 0.0–40.0)

## 2013-10-21 LAB — VITAMIN B12: Vitamin B-12: 393 pg/mL (ref 211–911)

## 2013-10-21 NOTE — Progress Notes (Signed)
Pre visit review using our clinic review tool, if applicable. No additional management support is needed unless otherwise documented below in the visit note. 

## 2013-10-21 NOTE — Patient Instructions (Signed)
  You had your Medicare Wellness Exam today.  Please have your blood drawn prior to leaving.  I will send you the results through my chart once they are available.  You will need to return the stool sample to check for occult blood.  You received your pneumonia vaccine (Prevnar) which is covered by Medicare.  Return in the early fall for your pneumonia vaccine.

## 2013-10-21 NOTE — Addendum Note (Signed)
Addended by: Vernetta Honey on: 10/21/2013 09:46 AM   Modules accepted: Orders

## 2013-10-21 NOTE — Progress Notes (Signed)
Patient ID: Rodney Delacruz, male   DOB: March 22, 1934, 80 y.o.   MRN: [id number]    Subjective:    Patient ID: Rodney Delacruz, male    DOB: February 21, 1934, 80 y.o.   MRN: [id number]  HPI  The patient is here for annual Medicare wellness examination and management of other chronic and acute problems.   The risk factors are reflected in the social history.  The roster of all physicians providing medical care to patient is listed in the Snapshot section of the chart.  Activities of daily living:  The patient is 100% independent in all ADLs: dressing, toileting, bathing, feeding as well as independent mobility.  Instrumental Activities of daily living: The patient is 100% independent in all iADLs: cooking, driving, keeping track of finances, managing medications, shopping, using telephone and computer.  Home safety: The patient has smoke detectors in the home. Batteries are changed on a regular basis and smoke detector tested. Seatbelts are worn 100%.  Firearms in the home are secured. There is no violence in the home. No hx of IPV.  There is no risks for hepatitis, STDs or HIV. There is no history of blood transfusion. No travel history to infectious disease endemic areas of the world.  The patient has seen dentist in the last six month. Pt has seen eye doctor in the last year. Slight hearing impairment of the right noted during exam. Pt has deferred audiologic testing at this time.  No excessive sun exposure. Discussed the need for sun protection: hats, long sleeves and use of sunscreen if there is significant sun exposure.   Diet: the importance of a healthy diet is discussed. Pt follows a healthy diet.  The benefits of regular aerobic exercise were discussed. Pt walks 1.5 miles 5 days a week.  Depression screen: there are no signs or vegative symptoms of depression- irritability, change in appetite, anhedonia, sadness/tearfullness.  Cognitive assessment: the patient manages all their financial and  personal affairs and is actively engaged. Able to relate day, date, year and events; recalled 2/3 objects at 3 minutes. He is alert and oriented to person, place, time and situation/circumstances.  The following portions of the patient's history were reviewed and updated as appropriate: allergies, current medications, past family history, past medical history,  past surgical history, past social history  and problem list.  Visual acuity was not assessed per patient preference since has regular follow up with ophthalmologist. Hearing and body mass index were assessed and reviewed.   During the course of the visit the patient was educated and counseled about appropriate screening and preventive services including : fall prevention , diabetes screening, nutrition counseling, colorectal cancer screening, and recommended immunizations.     Past Medical History  Diagnosis Date  . Coronary artery disease   . Hypertension   . Hyperlipidemia   . MI (myocardial infarction) 2009  . Anxiety   . Depression   . Ulcer   . Allergy   . Heart murmur   . Elevated PSA     Followed by Dr. Rosana Berger every 6 month     Past Surgical History  Procedure Laterality Date  . Coronary artery bypass graft  2009    [facility]  . Cardiac catheterization  2005, 2009  . Coronary angioplasty  2005    s/p stent placement @ [facility]  . Cholecystectomy  2001  . Tonsillectomy and adenoidectomy  1942     Family History  Problem Relation Age of Onset  . Heart attack Brother   .  Heart disease Brother     CAD  . Alcohol abuse Father   . Diabetes Sister   . Alcohol abuse Paternal Grandfather   . Stroke Mother      History   Social History  . Marital Status: Married    Spouse Name: Olin Hauser    Number of Children: 2  . Years of Education: 12   Occupational History  . 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 Smoking status: Former Smoker -- 1.00 packs/day for 20 years    Types:  Cigarettes    Quit date: 08/15/1976  . Smokeless tobacco: Never Used  . Alcohol Use: Yes     Comment: Occasional  . Drug Use: No  . Sexual Activity: Not on file   Other Topics Concern  . Not on file   Social History Narrative   Mr. Matison grew up in [place], [place]. He currently lives in [place] with his wife, Olin Hauser, of 14 years. He was married previously to his high school sweetheart. They were married for 33 years. He widowed in 1993. They had two son Christia Reading and Elric). Mr. Cedillos sons live locally [facility] and [place]). He worked at [employer] and retired from [employer]. He was there for 20 years. He worked in Designer, television/film set. He enjoys fishing, reading, watching TV on his spare time. He also enjoys walking and visiting friends.      Current Outpatient Prescriptions on File Prior to Visit  Medication Sig Dispense Refill  . aspirin 81 MG tablet Take 81 mg by mouth daily.      Marland Kitchen buPROPion (WELLBUTRIN XL) 300 MG 24 hr tablet Take 300 mg by mouth daily.      . fluticasone (FLONASE) 50 MCG/ACT nasal spray Place 1 spray into both nostrils daily.  16 g  6  . lovastatin (MEVACOR) 40 MG tablet Take 40 mg by mouth at bedtime.      . metoprolol succinate (TOPROL-XL) 25 MG 24 hr tablet Take 1 tablet (25 mg total) by mouth daily.  90 tablet  3  . Multiple Vitamin (MULTI-VITAMIN DAILY PO) Take by mouth.      . nitroGLYCERIN (NITROSTAT) 0.4 MG SL tablet Place 1 tablet (0.4 mg total) under the tongue every 5 (five) minutes as needed for chest pain.  25 tablet  1  . omeprazole (PRILOSEC) 20 MG capsule Take 1 capsule (20 mg total) by mouth daily.  180 capsule  3  . ramipril (ALTACE) 2.5 MG capsule Take 1 capsule (2.5 mg total) by mouth daily.  90 capsule  3   No current facility-administered medications on file prior to visit.     Review of Systems  Constitutional: Negative.   HENT: Negative.   Eyes: Negative.   Respiratory: Negative.   Cardiovascular:  Negative.   Gastrointestinal: Negative.   Endocrine: Negative.   Genitourinary: Negative.   Musculoskeletal: Negative.   Skin: Negative.   Allergic/Immunologic: Negative.   Neurological: Negative.   Hematological: Negative.   Psychiatric/Behavioral: Negative.        Objective:  BP 112/58  Pulse 63  Temp(Src) 98.4 F (36.9 C) (Oral)  Resp 14  Wt 197 lb (89.359 kg)  SpO2 96%   Physical Exam  Constitutional: He is oriented to person, place, and time. He appears well-developed and well-nourished. No distress.  HENT:  Head: Normocephalic and atraumatic.  Right Ear: External ear normal.  Left Ear: External ear normal.  Nose: Nose normal.  Mouth/Throat: Oropharynx is clear and moist.  Eyes: Conjunctivae and EOM are normal. Pupils are equal, round, and reactive to light.  Neck: Normal range of motion. Neck supple. No tracheal deviation present. No thyromegaly present.  Cardiovascular: Normal rate, regular rhythm, normal heart sounds and intact distal pulses.  Exam reveals no gallop and no friction rub.   No murmur heard. Pulmonary/Chest: Effort normal and breath sounds normal. No respiratory distress. He has no wheezes. He has no rales.  Abdominal: Soft. Bowel sounds are normal. He exhibits no distension and no mass. There is no tenderness. There is no rebound and no guarding.  Genitourinary:  Deferred. Pt followed by Urology q 6 months  Musculoskeletal: Normal range of motion. He exhibits no edema and no tenderness.  Lymphadenopathy:    He has no cervical adenopathy.  Neurological: He is alert and oriented to person, place, and time. He has normal reflexes. No cranial nerve deficit. Coordination normal.  Skin: Skin is warm and dry.  Psychiatric: He has a normal mood and affect. His behavior is normal. Judgment and thought content normal.       Assessment & Plan:   1. Routine general medical examination at a health care facility Normal physical exam excluding  genitalia/prostate as he is followed by Urology every 6 months. All screenings have been addressed.   - CBC with Differential - Fecal occult blood, imunochemical  2. HLD (hyperlipidemia) Pt is currently on mevacor. Check lipids. Continue to follow - Lipid panel  3. HTN (hypertension) Well controlled on Toprol XL, ramipril. Check labs. Continue to follow. - CBC with Differential - Comprehensive metabolic panel  4. Unspecified vitamin D deficiency Check vitamin D level. Continue to follow - Vit D  25 hydroxy (rtn osteoporosis monitoring)  5. B12 deficiency Check levels. - Vitamin B12  6. Need for vaccination with 13-polyvalent pneumococcal  Administered Prevnar during visit today.

## 2013-10-22 ENCOUNTER — Telehealth: Payer: Self-pay | Admitting: Adult Health

## 2013-10-22 LAB — VITAMIN D 25 HYDROXY (VIT D DEFICIENCY, FRACTURES): Vit D, 25-Hydroxy: 39 ng/mL (ref 30–89)

## 2013-10-22 NOTE — Telephone Encounter (Signed)
Relevant patient education assigned to patient using Emmi. ° °

## 2013-10-24 ENCOUNTER — Other Ambulatory Visit: Payer: Self-pay | Admitting: Adult Health

## 2013-10-24 ENCOUNTER — Encounter: Payer: Self-pay | Admitting: Adult Health

## 2013-10-24 DIAGNOSIS — D649 Anemia, unspecified: Secondary | ICD-10-CM

## 2013-10-24 DIAGNOSIS — E538 Deficiency of other specified B group vitamins: Secondary | ICD-10-CM

## 2013-10-25 ENCOUNTER — Other Ambulatory Visit: Payer: Self-pay | Admitting: *Deleted

## 2013-10-25 MED ORDER — FLUTICASONE PROPIONATE 50 MCG/ACT NA SUSP: 1.0000 | Freq: Every day | NASAL | Status: DC

## 2013-10-28 ENCOUNTER — Other Ambulatory Visit (INDEPENDENT_AMBULATORY_CARE_PROVIDER_SITE_OTHER): Payer: Medicare Other

## 2013-10-28 DIAGNOSIS — D649 Anemia, unspecified: Secondary | ICD-10-CM

## 2013-10-28 DIAGNOSIS — E538 Deficiency of other specified B group vitamins: Secondary | ICD-10-CM | POA: Diagnosis not present

## 2013-10-28 LAB — FOLATE: Folate: >24.8 ng/mL (ref >5.9)

## 2013-10-28 LAB — IRON: Iron: 72 ug/dL (ref 42–165)

## 2013-10-28 LAB — FERRITIN: Ferritin: 21 ng/mL — ABNORMAL LOW (ref 22.0–322.0)

## 2013-10-29 ENCOUNTER — Encounter: Payer: Self-pay | Admitting: Adult Health

## 2013-10-29 ENCOUNTER — Other Ambulatory Visit: Payer: Self-pay | Admitting: Adult Health

## 2013-10-29 MED ORDER — FERROUS SULFATE 325 (65 FE) MG PO TABS: 325.0000 mg | ORAL_TABLET | Freq: Two times a day (BID) | ORAL | Status: DC

## 2013-10-30 LAB — METHYLMALONIC ACID, SERUM: Methylmalonic Acid, Quant: 0.32 umol/L (ref <0.40)

## 2013-10-31 NOTE — Telephone Encounter (Signed)
Mailed unread message to pt  

## 2013-11-05 ENCOUNTER — Encounter: Payer: Self-pay | Admitting: Adult Health

## 2013-12-23 DIAGNOSIS — C44319 Basal cell carcinoma of skin of other parts of face: Secondary | ICD-10-CM | POA: Diagnosis not present

## 2014-02-13 ENCOUNTER — Encounter: Payer: Self-pay | Admitting: Cardiovascular Disease

## 2014-02-13 ENCOUNTER — Ambulatory Visit (INDEPENDENT_AMBULATORY_CARE_PROVIDER_SITE_OTHER): Payer: Medicare Other | Admitting: Cardiovascular Disease

## 2014-02-13 VITALS — BP 150/70 | HR 69 | Ht 73.0 in | Wt 196.0 lb

## 2014-02-13 DIAGNOSIS — I2584 Coronary atherosclerosis due to calcified coronary lesion: Secondary | ICD-10-CM | POA: Diagnosis not present

## 2014-02-13 DIAGNOSIS — E785 Hyperlipidemia, unspecified: Secondary | ICD-10-CM

## 2014-02-13 DIAGNOSIS — I2510 Atherosclerotic heart disease of native coronary artery without angina pectoris: Secondary | ICD-10-CM

## 2014-02-13 DIAGNOSIS — I10 Essential (primary) hypertension: Secondary | ICD-10-CM

## 2014-02-13 DIAGNOSIS — Z951 Presence of aortocoronary bypass graft: Secondary | ICD-10-CM

## 2014-02-13 MED ORDER — ATORVASTATIN CALCIUM 20 MG PO TABS: 20.0000 mg | ORAL_TABLET | Freq: Every day | ORAL | Status: DC

## 2014-02-13 NOTE — Patient Instructions (Signed)
You are doing well.  Please start lipitor 1/2 pill for a few weeks,  Then slowly increase up to a full pill if no significant leg ache  Please call us if you have new issues that need to be addressed before your next appt.  Your physician wants you to follow-up in: 6 months.  You will receive a reminder letter in the mail two months in advance. If you don't receive a letter, please call our office to schedule the follow-up appointment.

## 2014-02-13 NOTE — Progress Notes (Signed)
Patient ID: Rodney Delacruz, male    DOB: 06-05-1934, 80 y.o.   MRN: [id number]  HPI Comments: 80 year old male with coronary artery disease, prior PCI and bypass surgery in 2008 at [facility], presenting to [facility] for chest pain on 10/30/2012. He ruled out for MI and had a Myoview study that showed no ischemia. history of GERD.  He presents today for routine followup  Reports that he is doing well, walks 1.5 miles daily with no significant symptoms of chest pain or shortness of breath. He stopped his lovastatin several months ago secondary to muscle ache. Symptoms have improved but he does continue to have occasional symptoms On previous visits had periodic chest pain she felt was from his previous bypass, surgical issue. Somewhat tender to palpation  Prior Total cholesterol 148, LDL 81, HDL 49. No recent labs available.  Cardiac catheterization June 2009 The shows 90% distal left main disease, 70% ostial LAD disease, 100% mid LAD at the site of a prior stent, 100% distal LAD disease at the site of prior stent, D1 with 60% disease, proximal circumflex with 40% disease, mid circumflex with 50%, OM1 with 30% disease, proximal RCA with 30% followed by 40% followed by 60% disease, 50% distal PDA disease  EKG shows normal sinus rhythm with rate 69 beats per minute with no significant ST or T wave changes  Outpatient Encounter Prescriptions as of 02/13/2014  Medication Sig  . aspirin 81 MG tablet Take 81 mg by mouth daily.  Marland Kitchen buPROPion (WELLBUTRIN XL) 300 MG 24 hr tablet Take 300 mg by mouth daily.  . ferrous sulfate 325 (65 FE) MG tablet Take 1 tablet (325 mg total) by mouth 2 (two) times daily with a meal.  . fluticasone (FLONASE) 50 MCG/ACT nasal spray Place 1 spray into both nostrils daily.  Marland Kitchen lovastatin (MEVACOR) 40 MG tablet Take 40 mg by mouth at bedtime. Not taking the past several months   . metoprolol succinate (TOPROL-XL) 25 MG 24 hr tablet Take 1 tablet (25 mg total) by mouth daily.  .  Multiple Vitamin (MULTI-VITAMIN DAILY PO) Take by mouth.  . nitroGLYCERIN (NITROSTAT) 0.4 MG SL tablet Place 1 tablet (0.4 mg total) under the tongue every 5 (five) minutes as needed for chest pain.  Marland Kitchen omeprazole (PRILOSEC) 20 MG capsule Take 1 capsule (20 mg total) by mouth daily.  . ramipril (ALTACE) 2.5 MG capsule Take 1 capsule (2.5 mg total) by mouth daily.    Review of Systems  Constitutional: Negative.   HENT: Negative.   Eyes: Negative.   Respiratory: Negative.   Cardiovascular: Negative.   Gastrointestinal: Negative.   Endocrine: Negative.   Musculoskeletal: Negative.   Skin: Negative.   Allergic/Immunologic: Negative.   Neurological: Negative.   Hematological: Negative.   Psychiatric/Behavioral: Negative.   All other systems reviewed and are negative.   BP 150/70  Pulse 69  Ht 6\' 1"  (1.854 m)  Wt 196 lb (88.905 kg)  BMI 25.86 kg/m2  Physical Exam  Nursing note and vitals reviewed. Constitutional: He is oriented to person, place, and time. He appears well-developed and well-nourished.  HENT:  Head: Normocephalic.  Nose: Nose normal.  Mouth/Throat: Oropharynx is clear and moist.  Eyes: Conjunctivae are normal. Pupils are equal, round, and reactive to light.  Neck: Normal range of motion. Neck supple. No JVD present.  Cardiovascular: Normal rate, regular rhythm, S1 normal, S2 normal, normal heart sounds and intact distal pulses.  Exam reveals no gallop and no friction rub.  No murmur heard. Pulmonary/Chest: Effort normal and breath sounds normal. No respiratory distress. He has no wheezes. He has no rales. He exhibits no tenderness.  Abdominal: Soft. Bowel sounds are normal. He exhibits no distension. There is no tenderness.  Musculoskeletal: Normal range of motion. He exhibits no edema and no tenderness.  Lymphadenopathy:    He has no cervical adenopathy.  Neurological: He is alert and oriented to person, place, and time. Coordination normal.  Skin: Skin is warm  and dry. No rash noted. No erythema.  Psychiatric: He has a normal mood and affect. His behavior is normal. Judgment and thought content normal.      Assessment and Plan

## 2014-02-16 NOTE — Assessment & Plan Note (Signed)
Reports having myalgias on lovastatin. After long discussion, he we'll try generic Lipitor 10 mg daily with slow titration up to 20 mg daily

## 2014-02-16 NOTE — Assessment & Plan Note (Signed)
Blood pressure is well controlled on today's visit. No changes made to the medications. 

## 2014-02-16 NOTE — Assessment & Plan Note (Signed)
Prior CABG in 2008. Stable, exercising on a regular basis

## 2014-02-16 NOTE — Assessment & Plan Note (Signed)
Currently with no symptoms of angina. No further workup at this time. Continue current medication regimen. 

## 2014-02-25 DIAGNOSIS — F331 Major depressive disorder, recurrent, moderate: Secondary | ICD-10-CM | POA: Diagnosis not present

## 2014-03-05 DIAGNOSIS — N529 Male erectile dysfunction, unspecified: Secondary | ICD-10-CM | POA: Diagnosis not present

## 2014-03-05 DIAGNOSIS — R972 Elevated prostate specific antigen [PSA]: Secondary | ICD-10-CM | POA: Diagnosis not present

## 2014-03-05 DIAGNOSIS — N138 Other obstructive and reflux uropathy: Secondary | ICD-10-CM | POA: Diagnosis not present

## 2014-03-05 DIAGNOSIS — N401 Enlarged prostate with lower urinary tract symptoms: Secondary | ICD-10-CM | POA: Diagnosis not present

## 2014-03-26 ENCOUNTER — Ambulatory Visit (INDEPENDENT_AMBULATORY_CARE_PROVIDER_SITE_OTHER): Payer: Medicare Other | Admitting: Adult Health

## 2014-03-26 ENCOUNTER — Encounter: Payer: Self-pay | Admitting: Adult Health

## 2014-03-26 VITALS — BP 136/67 | HR 64 | Temp 98.4°F | Resp 14 | Ht 73.0 in | Wt 199.5 lb

## 2014-03-26 DIAGNOSIS — M79609 Pain in unspecified limb: Secondary | ICD-10-CM | POA: Diagnosis not present

## 2014-03-26 DIAGNOSIS — M79675 Pain in left toe(s): Secondary | ICD-10-CM

## 2014-03-26 DIAGNOSIS — I2584 Coronary atherosclerosis due to calcified coronary lesion: Secondary | ICD-10-CM | POA: Diagnosis not present

## 2014-03-26 DIAGNOSIS — I2510 Atherosclerotic heart disease of native coronary artery without angina pectoris: Secondary | ICD-10-CM | POA: Diagnosis not present

## 2014-03-26 MED ORDER — NAPROXEN 500 MG PO TABS: 500.0000 mg | ORAL_TABLET | Freq: Two times a day (BID) | ORAL | Status: DC

## 2014-03-26 NOTE — Progress Notes (Signed)
Patient ID: Rodney Delacruz, male   DOB: 09-03-1933, 80 y.o.   MRN: [id number]    Subjective:    Patient ID: Rodney Delacruz, male    DOB: 1934/07/02, 80 y.o.   MRN: [id number]  HPI  Pt is a pleasant 80 year old male who presents to clinic with pain in his left great toe. Onset approximately 2-3 days ago. Has not taken any over-the-counter medications. Patient denies any history of gout.   Past Medical History  Diagnosis Date  . Coronary artery disease   . Hypertension   . Hyperlipidemia   . MI (myocardial infarction) 2009  . Anxiety   . Depression   . Ulcer   . Allergy   . Heart murmur   . Elevated PSA     Followed by Dr. Rosana Berger every 6 month    Current Outpatient Prescriptions on File Prior to Visit  Medication Sig Dispense Refill  . aspirin 81 MG tablet Take 81 mg by mouth daily.      Marland Kitchen atorvastatin (LIPITOR) 20 MG tablet Take 1 tablet (20 mg total) by mouth daily.  90 tablet  3  . buPROPion (WELLBUTRIN XL) 300 MG 24 hr tablet Take 300 mg by mouth daily.      . ferrous sulfate 325 (65 FE) MG tablet Take 1 tablet (325 mg total) by mouth 2 (two) times daily with a meal.  60 tablet  3  . fluticasone (FLONASE) 50 MCG/ACT nasal spray Place 1 spray into both nostrils daily.  16 g  5  . metoprolol succinate (TOPROL-XL) 25 MG 24 hr tablet Take 1 tablet (25 mg total) by mouth daily.  90 tablet  3  . Multiple Vitamin (MULTI-VITAMIN DAILY PO) Take by mouth.      . nitroGLYCERIN (NITROSTAT) 0.4 MG SL tablet Place 1 tablet (0.4 mg total) under the tongue every 5 (five) minutes as needed for chest pain.  25 tablet  1  . omeprazole (PRILOSEC) 20 MG capsule Take 1 capsule (20 mg total) by mouth daily.  180 capsule  3  . ramipril (ALTACE) 2.5 MG capsule Take 1 capsule (2.5 mg total) by mouth daily.  90 capsule  3   No current facility-administered medications on file prior to visit.     Review of Systems  Musculoskeletal: Positive for joint swelling (left great toe pain).  All other systems  reviewed and are negative.      Objective:  BP 136/67  Pulse 64  Temp(Src) 98.4 F (36.9 C) (Oral)  Resp 14  Ht 6\' 1"  (1.854 m)  Wt 199 lb 8 oz (90.493 kg)  BMI 26.33 kg/m2  SpO2 95%   Physical Exam  Constitutional: He is oriented to person, place, and time. He appears well-developed and well-nourished. No distress.  Neck: Neck supple.  Cardiovascular: Normal rate and regular rhythm.   Pulmonary/Chest: Effort normal. No respiratory distress.  Musculoskeletal: Normal range of motion. He exhibits edema and tenderness.  Pain, erythema of left hallux  Neurological: He is alert and oriented to person, place, and time. Coordination normal.  Skin: Skin is warm. There is erythema.  Left hallux  Psychiatric: He has a normal mood and affect. His behavior is normal. Judgment and thought content normal.      Assessment & Plan:   1. Pain of toe of left foot Suspect gout. Check labs. Start Naproxen 500 mg bid with meals x 10 days.  - Uric acid

## 2014-03-26 NOTE — Progress Notes (Signed)
Pre visit review using our clinic review tool, if applicable. No additional management support is needed unless otherwise documented below in the visit note. 

## 2014-03-26 NOTE — Patient Instructions (Signed)
Start Naproxen 500 mg twice a day with meals for 10 days. I have sent this to your pharmacy. I will call you with results of your blood work once it is available.  Gout Gout is an inflammatory arthritis caused by a buildup of uric acid crystals in the joints. Uric acid is a chemical that is normally present in the blood. When the level of uric acid in the blood is too high it can form crystals that deposit in your joints and tissues. This causes joint redness, soreness, and swelling (inflammation). Repeat attacks are common. Over time, uric acid crystals can form into masses (tophi) near a joint, destroying bone and causing disfigurement. Gout is treatable and often preventable. CAUSES  The disease begins with elevated levels of uric acid in the blood. Uric acid is produced by your body when it breaks down a naturally found substance called purines. Certain foods you eat, such as meats and fish, contain high amounts of purines. Causes of an elevated uric acid level include:  Being passed down from parent to child (heredity).  Diseases that cause increased uric acid production (such as obesity, psoriasis, and certain cancers).  Excessive alcohol use.  Diet, especially diets rich in meat and seafood.  Medicines, including certain cancer-fighting medicines (chemotherapy), water pills (diuretics), and aspirin.  Chronic kidney disease. The kidneys are no longer able to remove uric acid well.  Problems with metabolism. Conditions strongly associated with gout include:  Obesity.  High blood pressure.  High cholesterol.  Diabetes. Not everyone with elevated uric acid levels gets gout. It is not understood why some people get gout and others do not. Surgery, joint injury, and eating too much of certain foods are some of the factors that can lead to gout attacks. SYMPTOMS   An attack of gout comes on quickly. It causes intense pain with redness, swelling, and warmth in a joint.  Fever can  occur.  Often, only one joint is involved. Certain joints are more commonly involved:  Base of the big toe.  Knee.  Ankle.  Wrist.  Finger. Without treatment, an attack usually goes away in a few days to weeks. Between attacks, you usually will not have symptoms, which is different from many other forms of arthritis. DIAGNOSIS  Your caregiver will suspect gout based on your symptoms and exam. In some cases, tests may be recommended. The tests may include:  Blood tests.  Urine tests.  X-rays.  Joint fluid exam. This exam requires a needle to remove fluid from the joint (arthrocentesis). Using a microscope, gout is confirmed when uric acid crystals are seen in the joint fluid. TREATMENT  There are two phases to gout treatment: treating the sudden onset (acute) attack and preventing attacks (prophylaxis).  Treatment of an Acute Attack.  Medicines are used. These include anti-inflammatory medicines or steroid medicines.  An injection of steroid medicine into the affected joint is sometimes necessary.  The painful joint is rested. Movement can worsen the arthritis.  You may use warm or cold treatments on painful joints, depending which works best for you.  Treatment to Prevent Attacks.  If you suffer from frequent gout attacks, your caregiver may advise preventive medicine. These medicines are started after the acute attack subsides. These medicines either help your kidneys eliminate uric acid from your body or decrease your uric acid production. You may need to stay on these medicines for a very long time.  The early phase of treatment with preventive medicine can be associated with  an increase in acute gout attacks. For this reason, during the first few months of treatment, your caregiver may also advise you to take medicines usually used for acute gout treatment. Be sure you understand your caregiver's directions. Your caregiver may make several adjustments to your medicine  dose before these medicines are effective.  Discuss dietary treatment with your caregiver or dietitian. Alcohol and drinks high in sugar and fructose and foods such as meat, poultry, and seafood can increase uric acid levels. Your caregiver or dietitian can advise you on drinks and foods that should be limited. HOME CARE INSTRUCTIONS   Do not take aspirin to relieve pain. This raises uric acid levels.  Only take over-the-counter or prescription medicines for pain, discomfort, or fever as directed by your caregiver.  Rest the joint as much as possible. When in bed, keep sheets and blankets off painful areas.  Keep the affected joint raised (elevated).  Apply warm or cold treatments to painful joints. Use of warm or cold treatments depends on which works best for you.  Use crutches if the painful joint is in your leg.  Drink enough fluids to keep your urine clear or pale yellow. This helps your body get rid of uric acid. Limit alcohol, sugary drinks, and fructose drinks.  Follow your dietary instructions. Pay careful attention to the amount of protein you eat. Your daily diet should emphasize fruits, vegetables, whole grains, and fat-free or low-fat milk products. Discuss the use of coffee, vitamin C, and cherries with your caregiver or dietitian. These may be helpful in lowering uric acid levels.  Maintain a healthy body weight. SEEK MEDICAL CARE IF:   You develop diarrhea, vomiting, or any side effects from medicines.  You do not feel better in 24 hours, or you are getting worse. SEEK IMMEDIATE MEDICAL CARE IF:   Your joint becomes suddenly more tender, and you have chills or a fever. MAKE SURE YOU:   Understand these instructions.  Will watch your condition.  Will get help right away if you are not doing well or get worse. Document Released: 07/29/2000 Document Revised: 12/16/2013 Document Reviewed: 03/14/2012 [facility] Patient Information 2015 [place], [place]. This information  is not intended to replace advice given to you by your health care provider. Make sure you discuss any questions you have with your health care provider.

## 2014-03-27 ENCOUNTER — Encounter: Payer: Self-pay | Admitting: Adult Health

## 2014-03-27 LAB — URIC ACID: Uric Acid, Serum: 7.2 mg/dL (ref 4.0–7.8)

## 2014-05-06 ENCOUNTER — Other Ambulatory Visit: Payer: Self-pay | Admitting: *Deleted

## 2014-05-06 MED ORDER — METOPROLOL SUCCINATE ER 25 MG PO TB24: 25.0000 mg | ORAL_TABLET | Freq: Every day | ORAL | Status: DC

## 2014-05-06 NOTE — Telephone Encounter (Signed)
Requested Prescriptions   Signed Prescriptions Disp Refills  . metoprolol succinate (TOPROL-XL) 25 MG 24 hr tablet 90 tablet 3    Sig: Take 1 tablet (25 mg total) by mouth daily.    Authorizing Provider: Minna Merritts    Ordering User: Britt Bottom

## 2014-06-02 DIAGNOSIS — Z23 Encounter for immunization: Secondary | ICD-10-CM | POA: Diagnosis not present

## 2014-06-12 DIAGNOSIS — L570 Actinic keratosis: Secondary | ICD-10-CM | POA: Diagnosis not present

## 2014-07-11 ENCOUNTER — Other Ambulatory Visit: Payer: Self-pay | Admitting: Cardiovascular Disease

## 2014-07-22 ENCOUNTER — Other Ambulatory Visit: Payer: Self-pay | Admitting: Cardiovascular Disease

## 2014-07-24 ENCOUNTER — Other Ambulatory Visit: Payer: Self-pay | Admitting: Internal Medicine

## 2014-08-05 ENCOUNTER — Ambulatory Visit (INDEPENDENT_AMBULATORY_CARE_PROVIDER_SITE_OTHER): Payer: Medicare Other | Admitting: Cardiovascular Disease

## 2014-08-05 ENCOUNTER — Encounter: Payer: Self-pay | Admitting: Cardiovascular Disease

## 2014-08-05 VITALS — BP 128/60 | HR 62 | Ht 73.0 in | Wt 196.8 lb

## 2014-08-05 DIAGNOSIS — I2510 Atherosclerotic heart disease of native coronary artery without angina pectoris: Secondary | ICD-10-CM | POA: Diagnosis not present

## 2014-08-05 DIAGNOSIS — I25709 Atherosclerosis of coronary artery bypass graft(s), unspecified, with unspecified angina pectoris: Secondary | ICD-10-CM | POA: Diagnosis not present

## 2014-08-05 DIAGNOSIS — I2584 Coronary atherosclerosis due to calcified coronary lesion: Secondary | ICD-10-CM | POA: Diagnosis not present

## 2014-08-05 DIAGNOSIS — I10 Essential (primary) hypertension: Secondary | ICD-10-CM

## 2014-08-05 DIAGNOSIS — E785 Hyperlipidemia, unspecified: Secondary | ICD-10-CM | POA: Diagnosis not present

## 2014-08-05 MED ORDER — CLOPIDOGREL BISULFATE 75 MG PO TABS: 75.0000 mg | ORAL_TABLET | Freq: Every day | ORAL | Status: DC

## 2014-08-05 NOTE — Assessment & Plan Note (Signed)
Blood pressure is well controlled on today's visit. No changes made to the medications. 

## 2014-08-05 NOTE — Progress Notes (Signed)
Patient ID: Rodney Delacruz, male    DOB: 03-26-1934, 80 y.o.   MRN: [id number]  HPI Comments: 80 year old male with coronary artery disease, prior PCI and bypass surgery in 2008 at [facility], presenting to [facility] for chest pain on 10/30/2012. He ruled out for MI and had a Myoview study that showed no ischemia. history of GERD.  He presents today for routine followup of his coronary artery disease  In follow-up today, he reports that he is doing well. Continues to be active, walks daily. Denies having any chest pain or shortness of breath with exertion. Previously had muscle ache on lovastatin. Change to Lipitor 20 mg daily Rare musculoskeletal chest pain  EKG on today's visit showing normal sinus rhythm with rate 62 bpm, no significant ST or T-wave changes  Total cholesterol 148, LDL 81, HDL 49. No recent labs available.  Cardiac catheterization June 2009 The shows 90% distal left main disease, 70% ostial LAD disease, 100% mid LAD at the site of a prior stent, 100% distal LAD disease at the site of prior stent, D1 with 60% disease, proximal circumflex with 40% disease, mid circumflex with 50%, OM1 with 30% disease, proximal RCA with 30% followed by 40% followed by 60% disease, 50% distal PDA disease  No Known Allergies  Outpatient Encounter Prescriptions as of 08/05/2014  Medication Sig  . aspirin 81 MG tablet Take 81 mg by mouth daily.  Marland Kitchen atorvastatin (LIPITOR) 20 MG tablet Take 1 tablet (20 mg total) by mouth daily.  Marland Kitchen buPROPion (WELLBUTRIN XL) 300 MG 24 hr tablet Take 300 mg by mouth daily.  . ferrous sulfate 325 (65 FE) MG tablet Take 1 tablet (325 mg total) by mouth 2 (two) times daily with a meal.  . fluticasone (FLONASE) 50 MCG/ACT nasal spray Place 1 spray into both nostrils daily.  . metoprolol succinate (TOPROL-XL) 25 MG 24 hr tablet Take 1 tablet (25 mg total) by mouth daily.  . Multiple Vitamin (MULTI-VITAMIN DAILY PO) Take by mouth.  . naproxen (NAPROSYN) 500 MG tablet TAKE  1 TABLET (500 MG TOTAL) BY MOUTH 2 (TWO) TIMES DAILY WITH A MEAL.  . nitroGLYCERIN (NITROSTAT) 0.4 MG SL tablet Place 1 tablet (0.4 mg total) under the tongue every 5 (five) minutes as needed for chest pain.  Marland Kitchen omeprazole (PRILOSEC) 20 MG capsule TAKE ONE CAPSULE BY MOUTH EVERY DAY  . ramipril (ALTACE) 2.5 MG capsule TAKE ONE CAPSULE BY MOUTH EVERY DAY  . clopidogrel (PLAVIX) 75 MG tablet Take 1 tablet (75 mg total) by mouth daily.    Past Medical History  Diagnosis Date  . Coronary artery disease   . Hypertension   . Hyperlipidemia   . MI (myocardial infarction) 2009  . Anxiety   . Depression   . Ulcer   . Allergy   . Heart murmur   . Elevated PSA     Followed by Dr. Rosana Berger every 6 month    Past Surgical History  Procedure Laterality Date  . Coronary artery bypass graft  2009    [facility]  . Cardiac catheterization  2005, 2009  . Coronary angioplasty  2005    s/p stent placement @ [facility]  . Cholecystectomy  2001  . Tonsillectomy and adenoidectomy  1942    Social History  reports that he quit smoking about 37 years ago. His smoking use included Cigarettes. He has a 20 pack-year smoking history. He has never used smokeless tobacco. He reports that he drinks alcohol. He reports that he does  not use illicit drugs.  Family History family history includes Alcohol abuse in his father and paternal grandfather; Diabetes in his sister; Heart attack in his brother; Heart disease in his brother; Stroke in his mother.     Review of Systems  Constitutional: Negative.   HENT: Negative.   Eyes: Negative.   Respiratory: Negative.   Cardiovascular:       Rare musculoskeletal chest pain  Gastrointestinal: Negative.   Musculoskeletal: Negative.   Neurological: Negative.   Hematological: Negative.   Psychiatric/Behavioral: Negative.   All other systems reviewed and are negative.   BP 128/60 mmHg  Pulse 62  Ht 6\' 1"  (1.854 m)  Wt 196 lb 12 oz (89.245 kg)  BMI 25.96 kg/m2  Physical  Exam  Constitutional: He is oriented to person, place, and time. He appears well-developed and well-nourished.  HENT:  Head: Normocephalic.  Nose: Nose normal.  Mouth/Throat: Oropharynx is clear and moist.  Eyes: Conjunctivae are normal. Pupils are equal, round, and reactive to light.  Neck: Normal range of motion. Neck supple. No JVD present.  Cardiovascular: Normal rate, regular rhythm, S1 normal, S2 normal, normal heart sounds and intact distal pulses.  Exam reveals no gallop and no friction rub.   No murmur heard. Pulmonary/Chest: Effort normal and breath sounds normal. No respiratory distress. He has no wheezes. He has no rales. He exhibits no tenderness.  Abdominal: Soft. Bowel sounds are normal. He exhibits no distension. There is no tenderness.  Musculoskeletal: Normal range of motion. He exhibits no edema or tenderness.  Lymphadenopathy:    He has no cervical adenopathy.  Neurological: He is alert and oriented to person, place, and time. Coordination normal.  Skin: Skin is warm and dry. No rash noted. No erythema.  Psychiatric: He has a normal mood and affect. His behavior is normal. Judgment and thought content normal.      Assessment and Plan   Nursing note and vitals reviewed.

## 2014-08-05 NOTE — Assessment & Plan Note (Signed)
Currently with no symptoms of angina. No further workup at this time. Continue current medication regimen. 

## 2014-08-05 NOTE — Assessment & Plan Note (Signed)
Recommended a repeat lipid panel. Goal LDL less than 70

## 2014-08-05 NOTE — Patient Instructions (Signed)
You are doing well. Please start plavix one a day with aspirin  Fasting lipid panel , LFTs at your convenience  Please call us if you have new issues that need to be addressed before your next appt.  Your physician wants you to follow-up in: 6 months.  You will receive a reminder letter in the mail two months in advance. If you don't receive a letter, please call our office to schedule the follow-up appointment.

## 2014-09-10 DIAGNOSIS — N401 Enlarged prostate with lower urinary tract symptoms: Secondary | ICD-10-CM | POA: Diagnosis not present

## 2014-09-10 DIAGNOSIS — R972 Elevated prostate specific antigen [PSA]: Secondary | ICD-10-CM | POA: Diagnosis not present

## 2014-10-15 ENCOUNTER — Other Ambulatory Visit: Payer: Self-pay | Admitting: *Deleted

## 2014-10-15 MED ORDER — FLUTICASONE PROPIONATE 50 MCG/ACT NA SUSP: 1.0000 | Freq: Every day | NASAL | Status: DC

## 2014-12-05 NOTE — H&P (Signed)
PATIENT NAME:  Rodney Delacruz, Rodney Delacruz MR#:  [id number] DATE OF BIRTH:  1933-10-29  DATE OF ADMISSION:  10/01/2012  ADMITTING PHYSICIAN: Dr. Gladstone Lighter  PRIMARY CARE PHYSICIAN: Currently none  PRIMARY CARDIOLOGIST: At [facility], Dr. Gwynneth Munson   CHIEF COMPLAINT: Chest pain.   HISTORY OF PRESENT ILLNESS: Mr. Rodney Delacruz is a very pleasant 81 year old Caucasian male with known history of coronary artery disease, status post prior stents and coronary artery bypass graft surgery in 2008 at [facility], comes to the hospital secondary to on and off chest pain for the past 2 weeks. The patient says that he has not seen his cardiologist in a while. His last stress was before his bypass graft surgery. He was doing fine up until 2 weeks ago when he started noticing chest pressure and tightness in the center of the chest, nonradiating, not associated with nausea, vomiting or diarrhea, going on and off for the past 2 weeks.  Initially, the episodes were really brief, lasting for minutes , but it has been more consistent when he woke up this morning, and he was concerned and came to the hospital.  His blood work revealed a first set of troponins were negative, so he is being admitted for unstable angina for a stress test under observation.   PAST MEDICAL HISTORY:  1.  Hypertension.  2.  Hyperlipidemia. 3.  Coronary artery disease, status post prior PCI and stent placement and also coronary artery bypass graft surgery.   PAST SURGICAL HISTORY:  1.  Cholecystectomy. 2.  Coronary artery bypass graft surgery.   ALLERGIES:  No known drug allergies.   CURRENT HOME MEDICATIONS:  1.  Aspirin 81 mg p.o. daily.  2.  Multivitamin 1 tablet p.o. daily. 3.  Bupropion 300 mg p.o. daily. 4.  Lansoprazole 30 mg p.o. daily. 5.  Lovastatin 40 mg p.o. daily.  6.  Toprol 25 mg p.o. daily.  7.  Ramipril 2.5 mg p.o. daily.  SOCIAL HISTORY: He lives at home with his wife. No history of any smoking or alcohol.  He is very active and walks 5 days  a week.   FAMILY HISTORY: Father was an alcoholic, died young.  Mother lived up to late 45s, and brother had heart disease.    REVIEW OF SYSTEMS:  CONSTITUTIONAL: No fever, fatigue, or weakness.  EYES: No blurred vision, double vision, or glaucoma or cataracts. Uses reading glasses.  ENT: No tinnitus, ear pain, epistaxis, hearing loss or postnasal drip.  RESPIRATORY: No cough, wheeze, hemoptysis, or COPD.  CARDIOVASCULAR: Positive for chest pressure and tightness. No orthopnea, edema, arrhythmia, palpitations, or syncope.  GASTROINTESTINAL: No nausea, vomiting, diarrhea, abdominal pain, hematemesis, or melena.  GENITOURINARY: No dysuria, hematuria, renal calculus, frequency or incontinence.  ENDOCRINE: No polyuria, nocturia, thyroid problems, heat or cold intolerance.  HEMATOLOGY: No anemia, easy bruising or bleeding.  SKIN: No acne, rash or lesions.  MUSCULOSKELETAL: No neck, back, shoulder pain, arthritis or gout.  NEUROLOGICAL: No numbness, weakness, CVA, TIA, or seizures.  PSYCHOLOGICAL: No anxiety, insomnia, or depression.   PHYSICAL EXAMINATION: VITAL SIGNS: Temperature afebrile 98.3 degrees Fahrenheit, pulse 69, respirations 18, blood pressure 163/70, pulse oximetry 96% on room air.  GENERAL: Well-built, well-nourished  male sitting in bed, not in any acute distress.  HEENT: Normocephalic, atraumatic. Pupils are equal, round, reacting to light. Anicteric sclerae. Extraocular movements intact. Oropharynx clear without erythema, mass or exudates.  NECK: Supple. No thyromegaly, JVD or carotid bruits. No lymphadenopathy.  LUNGS: Moving air bilaterally. No wheeze or crackles. No use  of accessory muscles for breathing.  CARDIOVASCULAR: S1, S2 regular rate and rhythm. A 3/6 systolic murmur in the precordial region. No rubs or gallops.  ABDOMEN: Soft, nontender, nondistended. No hepatosplenomegaly. Normal bowel sounds.  EXTREMITIES: No pedal edema. No clubbing or cyanosis. 2+ dorsalis pedis  pulses palpable bilaterally.  SKIN: No acne, rash, or lesions.  LYMPHATICS: No cervical or inguinal lymphadenopathy.  NEUROLOGIC: Cranial nerves intact. No focal motor or sensory deficits. PSYCHOLOGICAL: The patient is awake, alert, oriented x 3.   LABORATORY AND RADIOLOGICAL DATA:  WBC is 10.2, hemoglobin 13.3, hematocrit 40.0, platelet count 266.  Sodium 139, potassium 4.4, chloride 107, bicarbonate 26, BUN 20, creatinine 1.1, glucose 92, and calcium of 8.9. ALT 27, AST 30, alkaline phosphatase 80, total bilirubin 0.5 and albumin of 3.9.  INR 1.0. First set of troponins less than 0.02.   Chest x-ray showing borderline cardiomegaly and clear lung fields.  No acute cardiopulmonary disease.  EKG: Normal sinus rhythm, heart rate of 69. No acute ST-T wave elevations.   ASSESSMENT AND PLAN: A 81 year old male with history of CAD, status post bypass graft surgery and hypertension, being admitted for unstable angina.   1.  Unstable angina:  We will admit to telemetry under observation, get a cardiology consult.  His regular cardiologist is at [facility]. We will get a treadmill Myoview in the morning. We will continue his home medications, add nitro paste for now, and cycle troponins.  2.  Coronary artery disease, status post bypass graft surgery:  Management as mentioned above. He probably will need a local cardiologist.  3.  Hypertension: Continue his ramipril and Toprol for now.  If blood pressure is still elevated, we will either increase the medications or can add Imdur.  4.  Gastroesophageal reflux disease: He was on lansoprazole at home. We will continue Protonix here.  5.  Deep vein thrombosis prophylaxis: The patient is on subcutaneous heparin while in the hospital.  6.  Also, the patient will need a new PCP at the time of discharge.    CODE STATUS:  FULL CODE.       TIME SPENT ON ADMISSION: 50 minutes.  ____________________________ Gladstone Lighter, MD [id number] D: 10/01/2012 14:56:54  ET T: 10/01/2012 16:14:52 ET JOB#: [id number]  cc: Gladstone Lighter, MD, <Dictator> Gladstone Lighter MD ELECTRONICALLY SIGNED 10/10/2012 16:01

## 2014-12-05 NOTE — Discharge Summary (Signed)
PATIENT NAME:  Rodney Delacruz, Rodney Delacruz MR#:  [id number] DATE OF BIRTH:  22-Nov-1933  DATE OF ADMISSION:  10/01/2012 DATE OF DISCHARGE:  10/02/2012   For a detailed note, please check the history and physical done on admission by Dr. Tressia Miners.  DIAGNOSES AT DISCHARGE:  As follows:  Atypical chest pain. Now resolved. History of coronary artery disease, status post coronary artery bypass graft.  Hypertension. Hyperlipidemia.  Depression.   DIET:  The patient is being discharged on a low-sodium, low-fat diet.   ACTIVITY: As tolerated.  Follow-up with Dr. Ida Rogue next 1 to 2 weeks.  DISCHARGE MEDICATIONS: 1.  Toprol 25 mg daily. 2.  Ramipril 2.5 mg daily. 3.  Lovastatin 40 mg daily. 4.  Lansoprazole 30 mg daily. 5. Bupropion 300 mg daily. 6.  Aspirin 81 mg daily. 7.  Centrum multivitamin daily. 8.  Sublingual nitroglycerin as needed.   CONSULTANTS DURING THE HOSPITAL COURSE:  Dr. Ida Rogue from cardiology.  PERTINENT STUDIES DURING HOSPITAL COURSE:  As follows:  Chest x-ray on admission showing borderline cardiomegaly with no acute cardiopulmonary disease. A nuclear medicine stress test done on February 18 showing normal ejection fraction greater than 55%. No wash. No wall motion abnormalities.  No EKG changes concerning for ischemia.   HOSPITAL COURSE:  This is a 82 year old male with medical problems as mentioned above presented to the hospital with chest pain.   PROBLEM: 1.  Chest pain:  Given the patient's history of coronary artery disease and previous bypass surgery, the patient was observed overnight on telemetry, had 3 sets of cardiac markers checked which were negative. He underwent a nuclear medicine treadmill stress test which showed no evidence of acute myocardial ischemia with no reproduction of his symptoms. The cause of chest pain is likely atypical and noncardiac in nature. He is, therefore, being discharged home with close follow-up with Dr. Rockey Situ as an outpatient.  2.   Hypertension:  The patient remained hemodynamically stable. Will continue his ramipril and metoprolol as stated.  3.  Hyperlipidemia:  The patient was maintained on his lovastatin. He will resume that.  4.  History of depression:  The patient will continue his bupropion as stated.  5.  Gastroesophageal reflux disease:  The patient was maintained on his lansoprazole and he will resume that upon discharge.   CODE STATUS:  THE PATIENT IS A FULL CODE.  TIME SPENT:  35 minutes   ____________________________ Belia Heman. Verdell Carmine, MD vjs:ce D: 10/02/2012 16:21:00 ET T: 10/02/2012 17:02:17 ET JOB#: [id number]  cc: Belia Heman. Verdell Carmine, MD, <Dictator> Minna Merritts, MD   Henreitta Leber MD ELECTRONICALLY SIGNED 10/12/2012 1:01

## 2014-12-05 NOTE — Consult Note (Signed)
General Aspect 81 year old Caucasian male with known history of coronary artery disease, status post prior stents and coronary artery bypass graft surgery in 2008 at [facility], comes to the hospital secondary to on and off chest pain for the past 2 weeks. Cardiology consult for chest pain.   He reports pain in the afternoon, sometimes with exertion, mostly with rest. Prior to the past week, he reports no chest pain. He typically walks every morning wth no sx of chest pain. His last stress was before his bypass graft surgery. He describes his sx as a chest pressure and tightness in the center of the chest, nonradiating, not associated with nausea, vomiting or diarrhea, going on and off.  Initially, the episodes were really brief, lasting for minutes , but it has been more consistent when he woke up yesterday morning. he was concerned and came to the hospital.  His blood work revealed a first set of troponins were negative, so he is being admitted for unstable angina for a stress test under observation.   PAST MEDICAL HISTORY:  1.  Hypertension.  2.  Hyperlipidemia. 3.  Coronary artery disease, status post prior PCI and stent placement and also coronary artery bypass graft surgery.   PAST SURGICAL HISTORY:  1.  Cholecystectomy. 2.  Coronary artery bypass graft surgery.   ALLERGIES:  No known drug allergies.   Present Illness . SOCIAL HISTORY: He lives at home with his wife. No history of any smoking or alcohol.  He is very active and walks 5 days a week.   FAMILY HISTORY: Father was an alcoholic, died young.  Mother lived up to late 32s, and brother had heart disease.   Physical Exam:  GEN well developed, well nourished, no acute distress   HEENT red conjunctivae   NECK supple    RESP normal resp effort  clear BS    CARD Regular rate and rhythm    ABD denies tenderness    LYMPH negative neck   EXTR negative edema   SKIN normal to palpation   NEURO motor/sensory function intact    PSYCH alert, A+O to time, place, person, good insight   Review of Systems:  Subjective/Chief Complaint chest pain, resolved   General: No Complaints   Skin: No Complaints   ENT: No Complaints   Eyes: No Complaints   Neck: No Complaints   Respiratory: No Complaints   Cardiovascular: Chest pain or discomfort   Gastrointestinal: No Complaints   Genitourinary: No Complaints   Vascular: No Complaints   Musculoskeletal: No Complaints   Neurologic: No Complaints   Hematologic: No Complaints   Endocrine: No Complaints   Psychiatric: No Complaints   Review of Systems: All other systems were reviewed and found to be negative   Medications/Allergies Reviewed Medications/Allergies reviewed   Home Medications: Medication Instructions Status  metoprolol succinate 25 mg oral tablet, extended release 1 tab(s) orally once a day Active  ramipril 2.5 mg oral capsule 1 cap(s) orally once a day Active  lovastatin 40 mg oral tablet 1 tab(s) orally once a day Active  lansoprazole 30 mg oral delayed release capsule 1 cap(s) orally once a day Active  buPROPion 300 mg/24 hours oral tablet, extended release 1 tab(s) orally once a day Active  aspirin 81 mg oral tablet 1 tab(s) orally once a day Active  Centrum Multiple Vitamins with Minerals oral tablet, chewable 1 tab(s) orally once a day Active  Nitrostat 0.4 mg sublingual tablet 1 tab(s) sublingual , As Needed- for Chest  Pain  Active   Lab Results:  Routine Chem:  18-Feb-14 05:33   Glucose, Serum  101  BUN 18  Creatinine (comp) 1.17  Sodium, Serum 142  Potassium, Serum 4.1  Chloride, Serum  108  CO2, Serum 27  Calcium (Total), Serum 8.7  Anion Gap 7  Osmolality (calc) 285  eGFR (African American) >60  eGFR (Non-African American) >60 (eGFR values <7m/min/1.73 m2 may be an indication of chronic kidney disease (CKD). Calculated eGFR is useful in patients with stable renal function. The eGFR calculation will not be reliable  in acutely ill patients when serum creatinine is changing rapidly. It is not useful in  patients on dialysis. The eGFR calculation may not be applicable to patients at the low and high extremes of body sizes, pregnant women, and vegetarians.)  Cardiac:  18-Feb-14 05:33   CK, Total 135  CPK-MB, Serum 2.8 (Result(s) reported on 02 Oct 2012 at 0[facility])  Troponin I < 0.02 (0.00-0.05 0.05 ng/mL or less: NEGATIVE  Repeat testing in 3-6 hrs  if clinically indicated. >0.05 ng/mL: POTENTIAL  MYOCARDIAL INJURY. Repeat  testing in 3-6 hrs if  clinically indicated. NOTE: An increase or decrease  of 30% or more on serial  testing suggests a  clinically important change)  Routine Hem:  18-Feb-14 05:33   WBC (CBC) 7.6  RBC (CBC)  3.98  Hemoglobin (CBC)  12.8  Hematocrit (CBC)  38.4  Platelet Count (CBC) 249  MCV 97  MCH 32.2  MCHC 33.4  RDW 13.2  Neutrophil % 63.9  Lymphocyte % 20.0  Monocyte % 7.4  Eosinophil % 7.7  Basophil % 1.0  Neutrophil # 4.9  Lymphocyte # 1.5  Monocyte # 0.6  Eosinophil # 0.6  Basophil # 0.1 (Result(s) reported on 02 Oct 2012 at 06:36AM.)   EKG:  Interpretation ekg shows normal sinus rhythm with rate 69 bpm   Radiology Results: XRay:    17-Feb-14 12:44, Chest Portable Single View  Chest Portable Single View   REASON FOR EXAM:    Chest Pain  COMMENTS:       PROCEDURE: DXR - DXR PORTABLE CHEST SINGLE VIEW  - Oct 01 2012 12:44PM     RESULT: Cardiac monitoring electrodes are present. Sternotomy wires are   present with CABG markers demonstrated. The heart is borderline enlarged.   The lungs appear clear. There is no edema, infiltrate effusion or   pneumothorax.    IMPRESSION:  Borderline cardiomegaly. No acute cardiopulmonary disease   evident.    Dictation Site: 2    Verified By: GSundra Aland M.D., MD    No Known Allergies:   Vital Signs/Nurse's Notes: **Vital Signs.:   18-Feb-14 03:59  Vital Signs Type Routine  Temperature  Temperature (F) 98.1  Celsius 36.7  Temperature Source oral  Pulse Pulse 60  Respirations Respirations 17  Systolic BP Systolic BP 1492 Diastolic BP (mmHg) Diastolic BP (mmHg) 81  Mean BP 108    Impression 81year old Caucasian male with known history of coronary artery disease, status post prior stents [facility] the hospital secondary to on and off chest pain for the past 2 weeks. Cardiology consult for chest pain.   1) Chest pain stress test pending this AM cardiac enz neg, no significant EKG changes --Would continue outpt meds for now If stress test shows no ischemia, no further testing, could d/c home with follow up Would give NTG SL prescription  2) CAD,  CABG three vessel cabg in 2008 would continue on outpt meds.  3) Former smoker smoked 30 years, stopped in [place]: Ida Rogue (MD)  (Signed 18-Feb-14 10:25)  Authored: General Aspect/Present Illness, History and Physical Exam, Review of System, Past Medical History, Home Medications, Labs, EKG , Radiology, Allergies, Vital Signs/Nurse's Notes, Impression/Plan   Last Updated: 18-Feb-14 10:25 by Ida Rogue (MD)

## 2014-12-16 ENCOUNTER — Other Ambulatory Visit: Payer: Self-pay | Admitting: Nurse Practitioner

## 2014-12-22 NOTE — Telephone Encounter (Signed)
Mailed unread message to pt  

## 2015-01-19 ENCOUNTER — Ambulatory Visit (INDEPENDENT_AMBULATORY_CARE_PROVIDER_SITE_OTHER): Payer: Medicare Other | Admitting: Nurse Practitioner

## 2015-01-19 ENCOUNTER — Encounter: Payer: Self-pay | Admitting: Nurse Practitioner

## 2015-01-19 VITALS — BP 138/68 | HR 83 | Temp 98.1°F | Resp 14 | Ht 73.0 in | Wt 186.1 lb

## 2015-01-19 DIAGNOSIS — I10 Essential (primary) hypertension: Secondary | ICD-10-CM

## 2015-01-19 DIAGNOSIS — E785 Hyperlipidemia, unspecified: Secondary | ICD-10-CM

## 2015-01-19 DIAGNOSIS — E559 Vitamin D deficiency, unspecified: Secondary | ICD-10-CM | POA: Diagnosis not present

## 2015-01-19 LAB — COMPREHENSIVE METABOLIC PANEL
ALT: 15 U/L (ref 0–53)
AST: 19 U/L (ref 0–37)
Albumin: 4.2 g/dL (ref 3.5–5.2)
Alkaline Phosphatase: 72 U/L (ref 39–117)
BUN: 16 mg/dL (ref 6–23)
CO2: 28 mEq/L (ref 19–32)
Calcium: 9.2 mg/dL (ref 8.4–10.5)
Chloride: 104 mEq/L (ref 96–112)
Creatinine, Ser: 1.18 mg/dL (ref 0.40–1.50)
GFR: 63.3 mL/min (ref >60.00)
Glucose, Bld: 97 mg/dL (ref 70–99)
Potassium: 4.3 mEq/L (ref 3.5–5.1)
Sodium: 137 mEq/L (ref 135–145)
Total Bilirubin: 0.8 mg/dL (ref 0.2–1.2)
Total Protein: 7.2 g/dL (ref 6.0–8.3)

## 2015-01-19 LAB — CBC WITH DIFFERENTIAL/PLATELET
Basophils Absolute: 0.1 10*3/uL (ref 0.0–0.1)
Basophils Relative: 0.8 % (ref 0.0–3.0)
Eosinophils Absolute: 0.6 10*3/uL (ref 0.0–0.7)
Eosinophils Relative: 7.6 % — ABNORMAL HIGH (ref 0.0–5.0)
HCT: 39.9 % (ref 39.0–52.0)
Hemoglobin: 13.3 g/dL (ref 13.0–17.0)
Lymphocytes Relative: 19.8 % (ref 12.0–46.0)
Lymphs Abs: 1.6 10*3/uL (ref 0.7–4.0)
MCHC: 33.3 g/dL (ref 30.0–36.0)
MCV: 97.8 fl (ref 78.0–100.0)
Monocytes Absolute: 0.5 10*3/uL (ref 0.1–1.0)
Monocytes Relative: 6 % (ref 3.0–12.0)
Neutro Abs: 5.4 10*3/uL (ref 1.4–7.7)
Neutrophils Relative %: 65.8 % (ref 43.0–77.0)
Platelets: 279 10*3/uL (ref 150.0–400.0)
RBC: 4.08 Mil/uL — ABNORMAL LOW (ref 4.22–5.81)
RDW: 13.8 % (ref 11.5–15.5)
WBC: 8.3 10*3/uL (ref 4.0–10.5)

## 2015-01-19 LAB — LIPID PANEL
Cholesterol: 142 mg/dL (ref 0–200)
HDL: 47.7 mg/dL (ref >39.00)
LDL Cholesterol: 82 mg/dL (ref 0–99)
NonHDL: 94.3
Total CHOL/HDL Ratio: 3
Triglycerides: 64 mg/dL (ref 0.0–149.0)
VLDL: 12.8 mg/dL (ref 0.0–40.0)

## 2015-01-19 LAB — HEPATIC FUNCTION PANEL
ALT: 15 U/L (ref 0–53)
AST: 19 U/L (ref 0–37)
Albumin: 4.2 g/dL (ref 3.5–5.2)
Alkaline Phosphatase: 72 U/L (ref 39–117)
Bilirubin, Direct: 0.2 mg/dL (ref 0.0–0.3)
Total Bilirubin: 0.8 mg/dL (ref 0.2–1.2)
Total Protein: 7.2 g/dL (ref 6.0–8.3)

## 2015-01-19 LAB — VITAMIN D 25 HYDROXY (VIT D DEFICIENCY, FRACTURES): VITD: 30.44 ng/mL (ref 30.00–100.00)

## 2015-01-19 MED ORDER — NAPROXEN 500 MG PO TABS: ORAL_TABLET | ORAL | Status: DC

## 2015-01-19 MED ORDER — FLUTICASONE PROPIONATE 50 MCG/ACT NA SUSP: NASAL | Status: DC

## 2015-01-19 NOTE — Assessment & Plan Note (Signed)
Pt never had labs done after his last visit with Dr. Rockey Situ last time. Pt is aware he needs them and is fasting today. Will obtain HFP's and lipid panel. Sees Dr. Rockey Situ again on 6/22.

## 2015-01-19 NOTE — Assessment & Plan Note (Signed)
Sees Dr. Rockey Situ for Cardiology follow ups. He is stable on medications and will obtain CMET and CBC w/ diff today.   BP Readings from Last 3 Encounters:  01/19/15 138/68  08/05/14 128/60  03/26/14 136/67   Lab Results  Component Value Date   CREATININE 1.2 10/21/2013

## 2015-01-19 NOTE — Progress Notes (Signed)
Pre visit review using our clinic review tool, if applicable. No additional management support is needed unless otherwise documented below in the visit note. 

## 2015-01-19 NOTE — Assessment & Plan Note (Signed)
Will check level today since has a history of vitamin D. Deficiency. Will follow after results.

## 2015-01-19 NOTE — Patient Instructions (Addendum)
Debrox OTC (to clean out ears). If the hissing bothers you let me know and we can get it evaluated.   Please visit the lab before leaving today.   Follow up in 6 months to check on everything.

## 2015-01-19 NOTE — Progress Notes (Signed)
   Subjective:    Patient ID: Rodney Delacruz, male    DOB: 03/02/1934, 81 y.o.   MRN: [id number]  HPI  Rodney Delacruz is a 81 yo male with a CC of meeting me for the first time and requesting labs, Naproxen, and Flonase refills.   1) Flonase and Naproxen refills. Has not taken Naproxen recently.  Flonase- uses daily Right great toe he reports is stiff, but not bothering him, denies gout flare  2) Sees Urologist in [place] seeing every 6 months to check on his PSA level. There was an initial elevation, but it has been stable since.    3) Dr. Rockey Situ for Cardiology- Patient needs labs. Will repeat in office and he is following up with Dr. Rockey Situ on 02/04/15.   Review of Systems  Constitutional: Negative for fever, chills, diaphoresis and fatigue.  HENT: Negative for sore throat, tinnitus and trouble swallowing.        Left ear hissing noise, daily, does not last long  Eyes: Negative for visual disturbance.  Respiratory: Negative for cough, chest tightness, shortness of breath and wheezing.   Cardiovascular: Negative for chest pain, palpitations and leg swelling.  Gastrointestinal: Negative for nausea, vomiting and diarrhea.  Genitourinary: Negative for difficulty urinating.  Musculoskeletal: Negative for back pain and neck pain.  Skin: Negative for rash.  Neurological: Negative for dizziness and headaches.  Hematological: Does not bruise/bleed easily.  Psychiatric/Behavioral: Negative for suicidal ideas and sleep disturbance. The patient is not nervous/anxious.       Objective:   Physical Exam  Constitutional: He is oriented to person, place, and time. He appears well-developed and well-nourished. No distress.  BP 138/68 mmHg  Pulse 83  Temp(Src) 98.1 F (36.7 C) (Oral)  Resp 14  Ht 6\' 1"  (1.854 m)  Wt 186 lb 1.9 oz (84.423 kg)  BMI 24.56 kg/m2  SpO2 95%   HENT:  Head: Normocephalic and atraumatic.  Right Ear: External ear normal.  Left Ear: External ear normal.  Cardiovascular:  Normal rate and regular rhythm.   Pulmonary/Chest: Effort normal and breath sounds normal. No respiratory distress. He has no wheezes. He has no rales. He exhibits no tenderness.  Neurological: He is alert and oriented to person, place, and time.  Skin: Skin is warm and dry. No rash noted. He is not diaphoretic.  Psychiatric: He has a normal mood and affect. His behavior is normal. Judgment and thought content normal.      Assessment & Plan:

## 2015-02-04 ENCOUNTER — Encounter: Payer: Self-pay | Admitting: Cardiovascular Disease

## 2015-02-04 ENCOUNTER — Ambulatory Visit (INDEPENDENT_AMBULATORY_CARE_PROVIDER_SITE_OTHER): Payer: Medicare Other | Admitting: Cardiovascular Disease

## 2015-02-04 VITALS — BP 134/66 | HR 60 | Ht 72.0 in | Wt 186.5 lb

## 2015-02-04 DIAGNOSIS — Z951 Presence of aortocoronary bypass graft: Secondary | ICD-10-CM | POA: Diagnosis not present

## 2015-02-04 DIAGNOSIS — I25709 Atherosclerosis of coronary artery bypass graft(s), unspecified, with unspecified angina pectoris: Secondary | ICD-10-CM | POA: Diagnosis not present

## 2015-02-04 DIAGNOSIS — I10 Essential (primary) hypertension: Secondary | ICD-10-CM | POA: Diagnosis not present

## 2015-02-04 DIAGNOSIS — E785 Hyperlipidemia, unspecified: Secondary | ICD-10-CM

## 2015-02-04 MED ORDER — NITROGLYCERIN 0.4 MG SL SUBL: 0.4000 mg | SUBLINGUAL_TABLET | SUBLINGUAL | Status: DC | PRN

## 2015-02-04 MED ORDER — METOPROLOL SUCCINATE ER 25 MG PO TB24: 25.0000 mg | ORAL_TABLET | Freq: Every day | ORAL | Status: DC

## 2015-02-04 MED ORDER — ATORVASTATIN CALCIUM 20 MG PO TABS: 20.0000 mg | ORAL_TABLET | Freq: Every day | ORAL | Status: DC

## 2015-02-04 NOTE — Assessment & Plan Note (Signed)
He reports that he stays on Lipitor for 3 weeks and has to stop the medication for several days for muscle ache once per month

## 2015-02-04 NOTE — Assessment & Plan Note (Signed)
Currently with no symptoms of angina. No further testing at this time

## 2015-02-04 NOTE — Patient Instructions (Signed)
You are doing well. No medication changes were made.  Please call us if you have new issues that need to be addressed before your next appt.  Your physician wants you to follow-up in: 6 months.  You will receive a reminder letter in the mail two months in advance. If you don't receive a letter, please call our office to schedule the follow-up appointment.   

## 2015-02-04 NOTE — Assessment & Plan Note (Signed)
Blood pressure is well controlled on today's visit. No changes made to the medications. 

## 2015-02-04 NOTE — Progress Notes (Signed)
Patient ID: Rodney Delacruz, male    DOB: 08-May-1934, 81 y.o.   MRN: [id number]  HPI Comments: 81 year old male with coronary artery disease, prior PCI and bypass surgery in 2008 at [facility], presenting to [facility] for chest pain on 10/30/2012. He ruled out for MI and had a Myoview study that showed no ischemia. history of GERD.  He presents today for routine followup of his coronary artery disease  In follow-up today, he reports that he is doing well. Continues to be active, walks daily. Denies having any chest pain or shortness of breath with exertion. Previously had muscle ache on lovastatin. Change to Lipitor 20 mg daily. Currently not taking Lipitor, on a when necessary Exercising 4 days per week Reports that he had a rash on Plavix, only takes aspirin  EKG on today's visit showing normal sinus rhythm with rate 60 bpm, no significant ST or T-wave changes  Other past medical history Cardiac catheterization June 2009 The shows 90% distal left main disease, 70% ostial LAD disease, 100% mid LAD at the site of a prior stent, 100% distal LAD disease at the site of prior stent, D1 with 60% disease, proximal circumflex with 40% disease, mid circumflex with 50%, OM1 with 30% disease, proximal RCA with 30% followed by 40% followed by 60% disease, 50% distal PDA disease  Allergies  Allergen Reactions  . Plavix [Clopidogrel Bisulfate] Rash    Outpatient Encounter Prescriptions as of 02/04/2015  Medication Sig  . aspirin 81 MG tablet Take 81 mg by mouth daily.  Marland Kitchen atorvastatin (LIPITOR) 20 MG tablet Take 1 tablet (20 mg total) by mouth daily.  Marland Kitchen buPROPion (WELLBUTRIN XL) 300 MG 24 hr tablet Take 300 mg by mouth daily.  . ferrous sulfate 325 (65 FE) MG tablet Take 1 tablet (325 mg total) by mouth 2 (two) times daily with a meal.  . fluticasone (FLONASE) 50 MCG/ACT nasal spray PLACE 1 SPRAY INTO BOTH NOSTRILS DAILY.  . metoprolol succinate (TOPROL-XL) 25 MG 24 hr tablet Take 1 tablet (25 mg total) by  mouth daily.  . Multiple Vitamin (MULTI-VITAMIN DAILY PO) Take by mouth.  . naproxen (NAPROSYN) 500 MG tablet TAKE 1 TABLET (500 MG TOTAL) BY MOUTH 2 (TWO) TIMES DAILY WITH A MEAL.  . nitroGLYCERIN (NITROSTAT) 0.4 MG SL tablet Place 1 tablet (0.4 mg total) under the tongue every 5 (five) minutes as needed for chest pain.  Marland Kitchen omeprazole (PRILOSEC) 20 MG capsule TAKE ONE CAPSULE BY MOUTH EVERY DAY  . ramipril (ALTACE) 2.5 MG capsule TAKE ONE CAPSULE BY MOUTH EVERY DAY  . [DISCONTINUED] atorvastatin (LIPITOR) 20 MG tablet Take 1 tablet (20 mg total) by mouth daily. (Patient taking differently: Take 20 mg by mouth as needed. )  . [DISCONTINUED] metoprolol succinate (TOPROL-XL) 25 MG 24 hr tablet Take 1 tablet (25 mg total) by mouth daily.  . [DISCONTINUED] nitroGLYCERIN (NITROSTAT) 0.4 MG SL tablet Place 1 tablet (0.4 mg total) under the tongue every 5 (five) minutes as needed for chest pain.  . [DISCONTINUED] clopidogrel (PLAVIX) 75 MG tablet Take 1 tablet (75 mg total) by mouth daily. (Patient not taking: Reported on 02/04/2015)   No facility-administered encounter medications on file as of 02/04/2015.    Past Medical History  Diagnosis Date  . Coronary artery disease   . Hypertension   . Hyperlipidemia   . MI (myocardial infarction) 2009  . Anxiety   . Depression   . Ulcer   . Allergy   . Heart murmur   .  Elevated PSA     Followed by Dr. Rosana Berger every 6 month    Past Surgical History  Procedure Laterality Date  . Coronary artery bypass graft  2009    [facility]  . Cardiac catheterization  2005, 2009  . Coronary angioplasty  2005    s/p stent placement @ [facility]  . Cholecystectomy  2001  . Tonsillectomy and adenoidectomy  1942    Social History  reports that he quit smoking about 38 years ago. His smoking use included Cigarettes. He has a 20 pack-year smoking history. He has never used smokeless tobacco. He reports that he drinks alcohol. He reports that he does not use illicit  drugs.  Family History family history includes Alcohol abuse in his father and paternal grandfather; Diabetes in his sister; Heart attack in his brother; Heart disease in his brother; Stroke in his mother.     Review of Systems  Constitutional: Negative.   HENT: Negative.   Eyes: Negative.   Respiratory: Negative.   Cardiovascular:       Rare musculoskeletal chest pain  Gastrointestinal: Negative.   Musculoskeletal: Negative.   Neurological: Negative.   Hematological: Negative.   Psychiatric/Behavioral: Negative.   All other systems reviewed and are negative.   BP 134/66 mmHg  Pulse 60  Ht 6' (1.829 m)  Wt 186 lb 8 oz (84.596 kg)  BMI 25.29 kg/m2  Physical Exam  Constitutional: He is oriented to person, place, and time. He appears well-developed and well-nourished.  HENT:  Head: Normocephalic.  Nose: Nose normal.  Mouth/Throat: Oropharynx is clear and moist.  Eyes: Conjunctivae are normal. Pupils are equal, round, and reactive to light.  Neck: Normal range of motion. Neck supple. No JVD present.  Cardiovascular: Normal rate, regular rhythm, S1 normal, S2 normal, normal heart sounds and intact distal pulses.  Exam reveals no gallop and no friction rub.   No murmur heard. Pulmonary/Chest: Effort normal and breath sounds normal. No respiratory distress. He has no wheezes. He has no rales. He exhibits no tenderness.  Abdominal: Soft. Bowel sounds are normal. He exhibits no distension. There is no tenderness.  Musculoskeletal: Normal range of motion. He exhibits no edema or tenderness.  Lymphadenopathy:    He has no cervical adenopathy.  Neurological: He is alert and oriented to person, place, and time. Coordination normal.  Skin: Skin is warm and dry. No rash noted. No erythema.  Psychiatric: He has a normal mood and affect. His behavior is normal. Judgment and thought content normal.      Assessment and Plan   Nursing note and vitals reviewed.

## 2015-02-04 NOTE — Assessment & Plan Note (Signed)
Currently with no symptoms of angina. No further workup at this time. Continue current medication regimen. 

## 2015-03-11 DIAGNOSIS — R972 Elevated prostate specific antigen [PSA]: Secondary | ICD-10-CM | POA: Diagnosis not present

## 2015-03-11 DIAGNOSIS — N529 Male erectile dysfunction, unspecified: Secondary | ICD-10-CM | POA: Diagnosis not present

## 2015-05-04 ENCOUNTER — Other Ambulatory Visit: Payer: Self-pay | Admitting: Cardiovascular Disease

## 2015-05-07 DIAGNOSIS — Z23 Encounter for immunization: Secondary | ICD-10-CM | POA: Diagnosis not present

## 2015-05-15 DIAGNOSIS — H26491 Other secondary cataract, right eye: Secondary | ICD-10-CM | POA: Diagnosis not present

## 2015-06-02 DIAGNOSIS — H26491 Other secondary cataract, right eye: Secondary | ICD-10-CM | POA: Diagnosis not present

## 2015-07-02 ENCOUNTER — Other Ambulatory Visit: Payer: Self-pay | Admitting: Nurse Practitioner

## 2015-07-07 DIAGNOSIS — C44310 Basal cell carcinoma of skin of unspecified parts of face: Secondary | ICD-10-CM | POA: Diagnosis not present

## 2015-07-07 DIAGNOSIS — L570 Actinic keratosis: Secondary | ICD-10-CM | POA: Diagnosis not present

## 2015-07-21 ENCOUNTER — Ambulatory Visit: Payer: Self-pay | Admitting: Nurse Practitioner

## 2015-07-22 DIAGNOSIS — C44310 Basal cell carcinoma of skin of unspecified parts of face: Secondary | ICD-10-CM | POA: Diagnosis not present

## 2015-07-27 ENCOUNTER — Ambulatory Visit (INDEPENDENT_AMBULATORY_CARE_PROVIDER_SITE_OTHER): Payer: Medicare Other | Admitting: Nurse Practitioner

## 2015-07-27 ENCOUNTER — Encounter: Payer: Self-pay | Admitting: Nurse Practitioner

## 2015-07-27 VITALS — BP 146/62 | HR 72 | Temp 98.0°F | Wt 184.0 lb

## 2015-07-27 DIAGNOSIS — I10 Essential (primary) hypertension: Secondary | ICD-10-CM | POA: Diagnosis not present

## 2015-07-27 DIAGNOSIS — I25709 Atherosclerosis of coronary artery bypass graft(s), unspecified, with unspecified angina pectoris: Secondary | ICD-10-CM | POA: Diagnosis not present

## 2015-07-27 DIAGNOSIS — M1A4720 Other secondary chronic gout, left ankle and foot, without tophus (tophi): Secondary | ICD-10-CM | POA: Diagnosis not present

## 2015-07-27 DIAGNOSIS — M109 Gout, unspecified: Secondary | ICD-10-CM | POA: Insufficient documentation

## 2015-07-27 LAB — CBC WITH DIFFERENTIAL/PLATELET
Basophils Absolute: 0 10*3/uL (ref 0.0–0.1)
Basophils Relative: 0.5 % (ref 0.0–3.0)
Eosinophils Absolute: 0.9 10*3/uL — ABNORMAL HIGH (ref 0.0–0.7)
Eosinophils Relative: 10.5 % — ABNORMAL HIGH (ref 0.0–5.0)
HCT: 40.7 % (ref 39.0–52.0)
Hemoglobin: 13.5 g/dL (ref 13.0–17.0)
Lymphocytes Relative: 18.6 % (ref 12.0–46.0)
Lymphs Abs: 1.6 10*3/uL (ref 0.7–4.0)
MCHC: 33.1 g/dL (ref 30.0–36.0)
MCV: 97.5 fl (ref 78.0–100.0)
Monocytes Absolute: 0.5 10*3/uL (ref 0.1–1.0)
Monocytes Relative: 6.2 % (ref 3.0–12.0)
Neutro Abs: 5.4 10*3/uL (ref 1.4–7.7)
Neutrophils Relative %: 64.2 % (ref 43.0–77.0)
Platelets: 273 10*3/uL (ref 150.0–400.0)
RBC: 4.18 Mil/uL — ABNORMAL LOW (ref 4.22–5.81)
RDW: 14.1 % (ref 11.5–15.5)
WBC: 8.5 10*3/uL (ref 4.0–10.5)

## 2015-07-27 LAB — BASIC METABOLIC PANEL
BUN: 17 mg/dL (ref 6–23)
CO2: 29 mEq/L (ref 19–32)
Calcium: 9.2 mg/dL (ref 8.4–10.5)
Chloride: 105 mEq/L (ref 96–112)
Creatinine, Ser: 1.12 mg/dL (ref 0.40–1.50)
GFR: 67.14 mL/min (ref >60.00)
Glucose, Bld: 97 mg/dL (ref 70–99)
Potassium: 4.7 mEq/L (ref 3.5–5.1)
Sodium: 140 mEq/L (ref 135–145)

## 2015-07-27 LAB — URIC ACID: Uric Acid, Serum: 6.2 mg/dL (ref 4.0–7.8)

## 2015-07-27 NOTE — Assessment & Plan Note (Signed)
BP Readings from Last 3 Encounters:  07/27/15 146/62  02/04/15 134/66  01/19/15 138/68   We'll obtain lab work today. Patient sees Dr. Rockey Situ shortly. Slight elevation of blood pressure today. No changes to today's medication regimen.

## 2015-07-27 NOTE — Patient Instructions (Addendum)
Please follow up in 6 months.   Please visit the lab today.   Foods to watch in Gout-  Asparagus, Yeast (Beer, too!) , Red Meat- organ meats/sweatbreads  Anchovies, scallops, sardines, other shellfish   Alcohol and Gravy

## 2015-07-27 NOTE — Progress Notes (Signed)
Pre visit review using our clinic review tool, if applicable. No additional management support is needed unless otherwise documented below in the visit note. 

## 2015-07-27 NOTE — Assessment & Plan Note (Signed)
Diagnosis unclear. Patient reports no symptoms today. Will check uric acid level, asked patient to continue naproxen as needed for flares. Patient was also made aware of foods that exacerbate gout on handout.

## 2015-07-27 NOTE — Progress Notes (Signed)
Patient ID: Rodney Delacruz, male    DOB: 11-19-1933  Age: 81 y.o. MRN: [id number]  CC: Follow-up   HPI RAMZI HARKLEROAD presents for follow up of hypertension.  1) Patient has no complaints regarding blood pressure today, denies need for refills. Patient reports he has been doing well over these past 6 months.  2) Left foot large toe, Naproxen helpful -needs food handout. Patient reports this comes and goes and naproxen usually takes care of the symptoms.   History Rudolfo has a past medical history of Coronary artery disease; Hypertension; Hyperlipidemia; MI (myocardial infarction) (2009); Anxiety; Depression; Ulcer; Allergy; Heart murmur; and Elevated PSA.   He has past surgical history that includes Coronary artery bypass graft (2009); Cardiac catheterization (2005, 2009); Coronary angioplasty (2005); Cholecystectomy (2001); and Tonsillectomy and adenoidectomy (1942).   His family history includes Alcohol abuse in his father and paternal grandfather; Diabetes in his sister; Heart attack in his brother; Heart disease in his brother; Stroke in his mother.He reports that he quit smoking about 38 years ago. His smoking use included Cigarettes. He has a 20 pack-year smoking history. He has never used smokeless tobacco. He reports that he drinks alcohol. He reports that he does not use illicit drugs.  Outpatient Prescriptions Prior to Visit  Medication Sig Dispense Refill  . aspirin 81 MG tablet Take 81 mg by mouth daily.    Marland Kitchen atorvastatin (LIPITOR) 20 MG tablet Take 1 tablet (20 mg total) by mouth daily. 90 tablet 3  . buPROPion (WELLBUTRIN XL) 300 MG 24 hr tablet Take 300 mg by mouth daily.    . ferrous sulfate 325 (65 FE) MG tablet Take 1 tablet (325 mg total) by mouth 2 (two) times daily with a meal. 60 tablet 3  . fluticasone (FLONASE) 50 MCG/ACT nasal spray PLACE 1 SPRAY INTO BOTH NOSTRILS DAILY. 16 g 2  . metoprolol succinate (TOPROL-XL) 25 MG 24 hr tablet TAKE 1 TABLET BY MOUTH EVERY DAY 90  tablet 3  . Multiple Vitamin (MULTI-VITAMIN DAILY PO) Take by mouth.    . naproxen (NAPROSYN) 500 MG tablet TAKE 1 TABLET (500 MG TOTAL) BY MOUTH 2 (TWO) TIMES DAILY WITH A MEAL. 30 tablet 2  . nitroGLYCERIN (NITROSTAT) 0.4 MG SL tablet Place 1 tablet (0.4 mg total) under the tongue every 5 (five) minutes as needed for chest pain. 25 tablet 11  . omeprazole (PRILOSEC) 20 MG capsule TAKE ONE CAPSULE BY MOUTH EVERY DAY 180 capsule 3  . ramipril (ALTACE) 2.5 MG capsule TAKE ONE CAPSULE BY MOUTH EVERY DAY 90 capsule 3   No facility-administered medications prior to visit.    ROS Review of Systems  Constitutional: Negative for fever, chills, diaphoresis and fatigue.  Eyes: Negative for visual disturbance.  Respiratory: Negative for chest tightness, shortness of breath and wheezing.   Cardiovascular: Negative for chest pain, palpitations and leg swelling.  Gastrointestinal: Negative for nausea, vomiting and diarrhea.  Musculoskeletal: Positive for joint swelling and arthralgias.       Left great toe  Skin: Negative for rash.  Neurological: Negative for dizziness, weakness and numbness.  Psychiatric/Behavioral: The patient is not nervous/anxious.     Objective:  BP 146/62 mmHg  Pulse 72  Temp(Src) 98 F (36.7 C) (Oral)  Wt 184 lb (83.462 kg)  SpO2 95%  Physical Exam  Constitutional: He is oriented to person, place, and time. He appears well-developed and well-nourished. No distress.  HENT:  Head: Normocephalic and atraumatic.  Right Ear: External ear normal.  Left Ear: External ear normal.  Eyes: Right eye exhibits no discharge. Left eye exhibits no discharge. No scleral icterus.  Cardiovascular: Normal rate, regular rhythm and normal heart sounds.  Exam reveals no gallop and no friction rub.   No murmur heard. Pulmonary/Chest: Effort normal and breath sounds normal. No respiratory distress. He has no wheezes. He has no rales. He exhibits no tenderness.  Musculoskeletal:  Did not  look at on left foot due to lack of symptoms today  Neurological: He is alert and oriented to person, place, and time.  Skin: Skin is warm and dry. No rash noted. He is not diaphoretic.  Psychiatric: He has a normal mood and affect. His behavior is normal. Judgment and thought content normal.   Assessment & Plan:   Savon was seen today for follow-up.  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 Guijarro maintain his buPROPion, aspirin, Multiple Vitamin (MULTI-VITAMIN DAILY PO), ferrous sulfate, omeprazole, ramipril, naproxen, nitroGLYCERIN, atorvastatin, metoprolol succinate, and fluticasone.  No orders of the defined types were placed in this encounter.     Follow-up: Return in about 6 months (around 01/25/2016) for FU HTN.

## 2015-08-03 ENCOUNTER — Ambulatory Visit (INDEPENDENT_AMBULATORY_CARE_PROVIDER_SITE_OTHER): Payer: Medicare Other | Admitting: Cardiovascular Disease

## 2015-08-03 ENCOUNTER — Encounter: Payer: Self-pay | Admitting: Cardiovascular Disease

## 2015-08-03 VITALS — BP 120/58 | HR 62 | Ht 72.0 in | Wt 187.0 lb

## 2015-08-03 DIAGNOSIS — Z951 Presence of aortocoronary bypass graft: Secondary | ICD-10-CM | POA: Diagnosis not present

## 2015-08-03 DIAGNOSIS — E785 Hyperlipidemia, unspecified: Secondary | ICD-10-CM

## 2015-08-03 DIAGNOSIS — I25709 Atherosclerosis of coronary artery bypass graft(s), unspecified, with unspecified angina pectoris: Secondary | ICD-10-CM | POA: Diagnosis not present

## 2015-08-03 DIAGNOSIS — I10 Essential (primary) hypertension: Secondary | ICD-10-CM

## 2015-08-03 NOTE — Assessment & Plan Note (Signed)
Denies any chest discomfort concerning for angina, No musculoskeletal pain post surgery

## 2015-08-03 NOTE — Progress Notes (Signed)
Patient ID: Rodney Delacruz, male    DOB: [birth year], 81 y.o.   MRN: [id number]  HPI Comments: 81 year old male with coronary artery disease, prior PCI and bypass surgery in 2008 at [facility], presenting to [facility] for chest pain on 10/30/2012. He ruled out for MI and had a Myoview study that showed no ischemia. history of GERD.  He presents today for routine followup of his coronary artery disease  In follow-up today, he reports that he is doing well.  Continues to be active, walks daily. Denies having any chest pain or shortness of breath with exertion. Takes Lipitor Lipitor 20 mg daily as tolerated, will miss some days secondary to myalgias Exercising 4 days per week Reports that he had a rash on Plavix, only takes aspirin  EKG on today's visit showing normal sinus rhythm with rate 62 bpm, no significant ST or T-wave changes  Other past medical history Cardiac catheterization June 2009 The shows 90% distal left main disease, 70% ostial LAD disease, 100% mid LAD at the site of a prior stent, 100% distal LAD disease at the site of prior stent, D1 with 60% disease, proximal circumflex with 40% disease, mid circumflex with 50%, OM1 with 30% disease, proximal RCA with 30% followed by 40% followed by 60% disease, 50% distal PDA disease  Allergies  Allergen Reactions  . Plavix [Clopidogrel Bisulfate] Rash    Outpatient Encounter Prescriptions as of 08/03/2015  Medication Sig  . aspirin 81 MG tablet Take 81 mg by mouth daily.  Marland Kitchen atorvastatin (LIPITOR) 20 MG tablet Take 1 tablet (20 mg total) by mouth daily.  Marland Kitchen buPROPion (WELLBUTRIN XL) 300 MG 24 hr tablet Take 300 mg by mouth daily.  . ferrous sulfate 325 (65 FE) MG tablet Take 1 tablet (325 mg total) by mouth 2 (two) times daily with a meal.  . fluticasone (FLONASE) 50 MCG/ACT nasal spray PLACE 1 SPRAY INTO BOTH NOSTRILS DAILY.  . metoprolol succinate (TOPROL-XL) 25 MG 24 hr tablet TAKE 1 TABLET BY MOUTH EVERY DAY  . Multiple Vitamin  (MULTI-VITAMIN DAILY PO) Take by mouth.  . naproxen (NAPROSYN) 500 MG tablet TAKE 1 TABLET (500 MG TOTAL) BY MOUTH 2 (TWO) TIMES DAILY WITH A MEAL.  . nitroGLYCERIN (NITROSTAT) 0.4 MG SL tablet Place 1 tablet (0.4 mg total) under the tongue every 5 (five) minutes as needed for chest pain.  Marland Kitchen omeprazole (PRILOSEC) 20 MG capsule TAKE ONE CAPSULE BY MOUTH EVERY DAY  . ramipril (ALTACE) 2.5 MG capsule TAKE ONE CAPSULE BY MOUTH EVERY DAY   No facility-administered encounter medications on file as of 08/03/2015.    Past Medical History  Diagnosis Date  . Coronary artery disease   . Hypertension   . Hyperlipidemia   . MI (myocardial infarction) ([place]) 2009  . Anxiety   . Depression   . Ulcer   . Allergy   . Heart murmur   . Elevated PSA     Followed by Dr. Rosana Berger every 6 month    Past Surgical History  Procedure Laterality Date  . Coronary artery bypass graft  2009    [facility]  . Cardiac catheterization  2005, 2009  . Coronary angioplasty  2005    s/p stent placement @ [facility]  . Cholecystectomy  2001  . Tonsillectomy and adenoidectomy  1942    Social History  reports that he quit smoking about 38 years ago. His smoking use included Cigarettes. He has a 20 pack-year smoking history. He has never used smokeless tobacco.  He reports that he drinks alcohol. He reports that he does not use illicit drugs.  Family History family history includes Alcohol abuse in his father and paternal grandfather; Diabetes in his sister; Heart attack in his brother; Heart disease in his brother; Stroke in his mother.   Review of Systems  Constitutional: Negative.   Respiratory: Negative.   Cardiovascular:       Rare musculoskeletal chest pain  Gastrointestinal: Negative.   Musculoskeletal: Negative.   Neurological: Negative.   Hematological: Negative.   Psychiatric/Behavioral: Negative.   All other systems reviewed and are negative.   BP 120/58 mmHg  Pulse 62  Ht 6' (1.829 m)  Wt 187 lb (84.823  kg)  BMI 25.36 kg/m2  Physical Exam  Constitutional: He is oriented to person, place, and time. He appears well-developed and well-nourished.  HENT:  Head: Normocephalic.  Nose: Nose normal.  Mouth/Throat: Oropharynx is clear and moist.  Eyes: Conjunctivae are normal. Pupils are equal, round, and reactive to light.  Neck: Normal range of motion. Neck supple. No JVD present.  Cardiovascular: Normal rate, regular rhythm, S1 normal, S2 normal, normal heart sounds and intact distal pulses.  Exam reveals no gallop and no friction rub.   No murmur heard. Pulmonary/Chest: Effort normal and breath sounds normal. No respiratory distress. He has no wheezes. He has no rales. He exhibits no tenderness.  Abdominal: Soft. Bowel sounds are normal. He exhibits no distension. There is no tenderness.  Musculoskeletal: Normal range of motion. He exhibits no edema or tenderness.  Lymphadenopathy:    He has no cervical adenopathy.  Neurological: He is alert and oriented to person, place, and time. Coordination normal.  Skin: Skin is warm and dry. No rash noted. No erythema.  Psychiatric: He has a normal mood and affect. His behavior is normal. Judgment and thought content normal.      Assessment and Plan   Nursing note and vitals reviewed.

## 2015-08-03 NOTE — Assessment & Plan Note (Signed)
Cholesterol is at goal on the current lipid regimen. No changes to the medications were made.  

## 2015-08-03 NOTE — Assessment & Plan Note (Signed)
Blood pressure is well controlled on today's visit. No changes made to the medications. 

## 2015-08-03 NOTE — Assessment & Plan Note (Signed)
Currently with no symptoms of angina. No further workup at this time. Continue current medication regimen. 

## 2015-08-03 NOTE — Patient Instructions (Signed)
You are doing well. No medication changes were made.  Please call us if you have new issues that need to be addressed before your next appt.  Your physician wants you to follow-up in: 6 months.  You will receive a reminder letter in the mail two months in advance. If you don't receive a letter, please call our office to schedule the follow-up appointment.   

## 2015-09-05 ENCOUNTER — Other Ambulatory Visit: Payer: Self-pay | Admitting: Cardiovascular Disease

## 2015-09-09 DIAGNOSIS — R972 Elevated prostate specific antigen [PSA]: Secondary | ICD-10-CM | POA: Diagnosis not present

## 2015-09-09 DIAGNOSIS — N529 Male erectile dysfunction, unspecified: Secondary | ICD-10-CM | POA: Diagnosis not present

## 2015-09-09 DIAGNOSIS — R351 Nocturia: Secondary | ICD-10-CM | POA: Diagnosis not present

## 2015-12-10 ENCOUNTER — Other Ambulatory Visit: Payer: Self-pay | Admitting: Nurse Practitioner

## 2016-01-05 DIAGNOSIS — L570 Actinic keratosis: Secondary | ICD-10-CM | POA: Diagnosis not present

## 2016-01-05 DIAGNOSIS — C44612 Basal cell carcinoma of skin of right upper limb, including shoulder: Secondary | ICD-10-CM | POA: Diagnosis not present

## 2016-01-05 DIAGNOSIS — L988 Other specified disorders of the skin and subcutaneous tissue: Secondary | ICD-10-CM | POA: Diagnosis not present

## 2016-01-14 ENCOUNTER — Other Ambulatory Visit: Payer: Self-pay | Admitting: *Deleted

## 2016-01-14 MED ORDER — NAPROXEN 500 MG PO TABS: ORAL_TABLET | ORAL | Status: DC

## 2016-01-20 DIAGNOSIS — C44612 Basal cell carcinoma of skin of right upper limb, including shoulder: Secondary | ICD-10-CM | POA: Diagnosis not present

## 2016-02-02 ENCOUNTER — Ambulatory Visit (INDEPENDENT_AMBULATORY_CARE_PROVIDER_SITE_OTHER): Payer: Medicare Other | Admitting: Cardiovascular Disease

## 2016-02-02 ENCOUNTER — Encounter: Payer: Self-pay | Admitting: Cardiovascular Disease

## 2016-02-02 VITALS — BP 126/60 | HR 68 | Ht 72.0 in | Wt 187.0 lb

## 2016-02-02 DIAGNOSIS — I10 Essential (primary) hypertension: Secondary | ICD-10-CM | POA: Diagnosis not present

## 2016-02-02 DIAGNOSIS — E785 Hyperlipidemia, unspecified: Secondary | ICD-10-CM | POA: Diagnosis not present

## 2016-02-02 DIAGNOSIS — I25709 Atherosclerosis of coronary artery bypass graft(s), unspecified, with unspecified angina pectoris: Secondary | ICD-10-CM

## 2016-02-02 DIAGNOSIS — Z951 Presence of aortocoronary bypass graft: Secondary | ICD-10-CM

## 2016-02-02 NOTE — Progress Notes (Signed)
Patient ID: Rodney Delacruz, male   DOB: Dec 03, 1933, 82 y.o.   MRN: [id number] Cardiology Office Note  Date:  02/02/2016   ID:  Rodney Delacruz, DOB 1934/07/24, MRN [id number]  PCP:  Rubbie Battiest, NP   Chief Complaint  Patient presents with  . other    6 month f/u. Meds reviewed verbally with pt.    HPI:  82 year old male with coronary artery disease, prior PCI and bypass surgery in 2008 at [facility], presenting to [facility] for chest pain on 10/30/2012. He ruled out for MI and had a Myoview study that showed no ischemia. history of GERD. He does have prior smoking history, stopped in the 70s, no diabetes He presents today for routine followup of his coronary artery disease  In follow-up today, he reports that he is doing well.  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 Reports that he had a rash on Plavix, only takes aspirin  Denies any new symptoms No recent lab work, has not established with new primary care  EKG on today's visit showing normal sinus rhythm with rate 68 bpm, no significant ST or T-wave changes, artifact in V1 and V2  Other past medical history Cardiac catheterization June 2009 The shows 90% distal left main disease, 70% ostial LAD disease, 100% mid LAD at the site of a prior stent, 100% distal LAD disease at the site of prior stent, D1 with 60% disease, proximal circumflex with 40% disease, mid circumflex with 50%, OM1 with 30% disease, proximal RCA with 30% followed by 40% followed by 60% disease, 50% distal PDA disease  PMH:   has a past medical history of Coronary artery disease; Hypertension; Hyperlipidemia; MI (myocardial infarction) ([place]) (2009); Anxiety; Depression; Ulcer; Allergy; Heart murmur; and Elevated PSA.  PSH:    Past Surgical History  Procedure Laterality Date  . Coronary artery bypass graft  2009    [facility]  .  Cardiac catheterization  2005, 2009  . Coronary angioplasty  2005    s/p stent placement @ [facility]  . Cholecystectomy  2001  . Tonsillectomy and adenoidectomy  1942    Current Outpatient Prescriptions  Medication Sig Dispense Refill  . aspirin 81 MG tablet Take 81 mg by mouth daily.    Marland Kitchen atorvastatin (LIPITOR) 20 MG tablet Take 1 tablet (20 mg total) by mouth daily. 90 tablet 3  . buPROPion (WELLBUTRIN XL) 300 MG 24 hr tablet Take 300 mg by mouth daily.    . ferrous sulfate 325 (65 FE) MG tablet Take 1 tablet (325 mg total) by mouth 2 (two) times daily with a meal. (Patient taking differently: Take 325 mg by mouth as needed. ) 60 tablet 3  . fluticasone (FLONASE) 50 MCG/ACT nasal spray PLACE 1 SPRAY INTO BOTH NOSTRILS DAILY. 16 g 2  . metoprolol succinate (TOPROL-XL) 25 MG 24 hr tablet TAKE 1 TABLET BY MOUTH EVERY DAY 90 tablet 3  . Multiple Vitamin (MULTI-VITAMIN DAILY PO) Take by mouth.    . naproxen (NAPROSYN) 500 MG tablet TAKE 1 TABLET (500 MG TOTAL) BY MOUTH 2 (TWO) TIMES DAILY WITH A MEAL. 30 tablet 1  . nitroGLYCERIN (NITROSTAT) 0.4 MG SL tablet Place 1 tablet (0.4 mg total) under the tongue every 5 (five) minutes as needed for chest pain. 25 tablet 11  . omeprazole (PRILOSEC) 20 MG capsule TAKE ONE CAPSULE BY MOUTH  EVERY DAY 90 capsule 3  . ramipril (ALTACE) 2.5 MG capsule TAKE ONE CAPSULE BY MOUTH EVERY DAY 90 capsule 3   No current facility-administered medications for this visit.     Allergies:   Plavix   Social History:  The patient  reports that he quit smoking about 39 years ago. His smoking use included Cigarettes. He has a 20 pack-year smoking history. He has never used smokeless tobacco. He reports that he drinks alcohol. He reports that he does not use illicit drugs.   Family History:   family history includes Alcohol abuse in his father and paternal grandfather; Diabetes in his sister; Heart attack in his brother; Heart disease in his brother; Stroke in his mother.     Review of Systems: Review of Systems  Constitutional: Negative.   Respiratory: Negative.   Cardiovascular: Negative.   Gastrointestinal: Negative.   Musculoskeletal: Negative.   Neurological: Negative.   Psychiatric/Behavioral: Negative.   All other systems reviewed and are negative.    PHYSICAL EXAM: VS:  BP 126/60 mmHg  Pulse 68  Ht 6' (1.829 m)  Wt 187 lb (84.823 kg)  BMI 25.36 kg/m2 , BMI Body mass index is 25.36 kg/(m^2). GEN: Well nourished, well developed, in no acute distress HEENT: normal Neck: no JVD, carotid bruits, or masses Cardiac: RRR; no murmurs, rubs, or gallops,no edema  Respiratory:  clear to auscultation bilaterally, normal work of breathing GI: soft, nontender, nondistended, + BS MS: no deformity or atrophy Skin: warm and dry, no rash Neuro:  Strength and sensation are intact Psych: euthymic mood, full affect    Recent Labs: 07/27/2015: BUN 17; Creatinine, Ser 1.12; Hemoglobin 13.5; Platelets 273.0; Potassium 4.7; Sodium 140    Lipid Panel Lab Results  Component Value Date   CHOL 142 01/19/2015   HDL 47.70 01/19/2015   LDLCALC 82 01/19/2015   TRIG 64.0 01/19/2015      Wt Readings from Last 3 Encounters:  02/02/16 187 lb (84.823 kg)  08/03/15 187 lb (84.823 kg)  07/27/15 184 lb (83.462 kg)       ASSESSMENT AND PLAN:  Coronary artery disease involving coronary bypass graft with unspecified angina pectoris - Plan: EKG 12-Lead Currently with no symptoms of angina. No further workup at this time. Continue current medication regimen.  Essential hypertension - Plan: EKG 12-Lead Blood pressure is well controlled on today's visit. No changes made to the medications.  Hyperlipidemia Recommended he have repeat lipid panel with primary care  Goal LDL less than 70   S/P CABG (coronary artery bypass graft) As above, no significant anginal symptoms   some occasional bone pain in his mediastinal area No further testing  Disposition:    F/U  6 months   Orders Placed This Encounter  Procedures  . EKG 12-Lead     Signed, Esmond Plants, M.D., Ph.D. 02/02/2016  [place], [place]

## 2016-02-02 NOTE — Patient Instructions (Signed)
You are doing well. No medication changes were made.  Please call us if you have new issues that need to be addressed before your next appt.  Your physician wants you to follow-up in: 6 months.  You will receive a reminder letter in the mail two months in advance. If you don't receive a letter, please call our office to schedule the follow-up appointment.   

## 2016-03-09 DIAGNOSIS — R972 Elevated prostate specific antigen [PSA]: Secondary | ICD-10-CM | POA: Diagnosis not present

## 2016-03-09 DIAGNOSIS — R351 Nocturia: Secondary | ICD-10-CM | POA: Diagnosis not present

## 2016-05-09 DIAGNOSIS — F331 Major depressive disorder, recurrent, moderate: Secondary | ICD-10-CM | POA: Diagnosis not present

## 2016-05-24 DIAGNOSIS — Z23 Encounter for immunization: Secondary | ICD-10-CM | POA: Diagnosis not present

## 2016-06-17 ENCOUNTER — Telehealth: Payer: Self-pay | Admitting: Nurse Practitioner

## 2016-06-17 NOTE — Telephone Encounter (Signed)
I called pt and left a vm to call office to sch AWV. Thank you! °

## 2016-06-30 ENCOUNTER — Encounter: Payer: Self-pay | Admitting: Family Medicine

## 2016-07-13 ENCOUNTER — Telehealth: Payer: Self-pay | Admitting: Nurse Practitioner

## 2016-07-13 NOTE — Telephone Encounter (Signed)
Pt only wants his wellness with his provider. Thank you!

## 2016-07-20 ENCOUNTER — Ambulatory Visit (INDEPENDENT_AMBULATORY_CARE_PROVIDER_SITE_OTHER): Payer: Medicare Other | Admitting: Family Medicine

## 2016-07-20 ENCOUNTER — Encounter: Payer: Self-pay | Admitting: Family Medicine

## 2016-07-20 VITALS — BP 155/63 | HR 65 | Temp 98.2°F | Resp 14 | Ht 72.0 in | Wt 188.4 lb

## 2016-07-20 DIAGNOSIS — I10 Essential (primary) hypertension: Secondary | ICD-10-CM | POA: Diagnosis not present

## 2016-07-20 DIAGNOSIS — Z8639 Personal history of other endocrine, nutritional and metabolic disease: Secondary | ICD-10-CM

## 2016-07-20 DIAGNOSIS — I2510 Atherosclerotic heart disease of native coronary artery without angina pectoris: Secondary | ICD-10-CM

## 2016-07-20 DIAGNOSIS — M1A9XX0 Chronic gout, unspecified, without tophus (tophi): Secondary | ICD-10-CM

## 2016-07-20 DIAGNOSIS — I25709 Atherosclerosis of coronary artery bypass graft(s), unspecified, with unspecified angina pectoris: Secondary | ICD-10-CM | POA: Diagnosis not present

## 2016-07-20 DIAGNOSIS — R972 Elevated prostate specific antigen [PSA]: Secondary | ICD-10-CM | POA: Insufficient documentation

## 2016-07-20 DIAGNOSIS — E785 Hyperlipidemia, unspecified: Secondary | ICD-10-CM | POA: Diagnosis not present

## 2016-07-20 DIAGNOSIS — R739 Hyperglycemia, unspecified: Secondary | ICD-10-CM

## 2016-07-20 LAB — COMPREHENSIVE METABOLIC PANEL
ALT: 15 U/L (ref 0–53)
AST: 18 U/L (ref 0–37)
Albumin: 3.9 g/dL (ref 3.5–5.2)
Alkaline Phosphatase: 69 U/L (ref 39–117)
BUN: 20 mg/dL (ref 6–23)
CO2: 29 mEq/L (ref 19–32)
Calcium: 9 mg/dL (ref 8.4–10.5)
Chloride: 106 mEq/L (ref 96–112)
Creatinine, Ser: 1.13 mg/dL (ref 0.40–1.50)
GFR: 66.29 mL/min (ref >60.00)
Glucose, Bld: 87 mg/dL (ref 70–99)
Potassium: 4.8 mEq/L (ref 3.5–5.1)
Sodium: 141 mEq/L (ref 135–145)
Total Bilirubin: 0.7 mg/dL (ref 0.2–1.2)
Total Protein: 6.5 g/dL (ref 6.0–8.3)

## 2016-07-20 LAB — CBC
HCT: 36.7 % — ABNORMAL LOW (ref 39.0–52.0)
Hemoglobin: 12.1 g/dL — ABNORMAL LOW (ref 13.0–17.0)
MCHC: 32.9 g/dL (ref 30.0–36.0)
MCV: 98 fl (ref 78.0–100.0)
Platelets: 291 10*3/uL (ref 150.0–400.0)
RBC: 3.74 Mil/uL — ABNORMAL LOW (ref 4.22–5.81)
RDW: 13.6 % (ref 11.5–15.5)
WBC: 8.6 10*3/uL (ref 4.0–10.5)

## 2016-07-20 LAB — URIC ACID: Uric Acid, Serum: 7.1 mg/dL (ref 4.0–7.8)

## 2016-07-20 LAB — LIPID PANEL
Cholesterol: 171 mg/dL (ref 0–200)
HDL: 46.3 mg/dL (ref >39.00)
LDL Cholesterol: 111 mg/dL — ABNORMAL HIGH (ref 0–99)
NonHDL: 124.95
Total CHOL/HDL Ratio: 4
Triglycerides: 70 mg/dL (ref 0.0–149.0)
VLDL: 14 mg/dL (ref 0.0–40.0)

## 2016-07-20 LAB — HEMOGLOBIN A1C: Hgb A1c MFr Bld: 5.8 % (ref 4.6–6.5)

## 2016-07-20 MED ORDER — NAPROXEN 500 MG PO TABS: ORAL_TABLET | ORAL | 1 refills | Status: DC

## 2016-07-20 MED ORDER — FLUTICASONE PROPIONATE 50 MCG/ACT NA SUSP: NASAL | 2 refills | Status: DC

## 2016-07-20 MED ORDER — RAMIPRIL 5 MG PO CAPS: 5.0000 mg | ORAL_CAPSULE | Freq: Every day | ORAL | 3 refills | Status: DC

## 2016-07-20 NOTE — Patient Instructions (Signed)
I have increased your Ramipril.  Repeat lab in 7-10 days.  Follow up BP check in 2 weeks. Follow up 3 months following.  Take care  Dr. Lacinda Axon

## 2016-07-20 NOTE — Assessment & Plan Note (Addendum)
Naproxen refilled. Advised against chronic use given cardiac history. Patient aware.

## 2016-07-20 NOTE — Progress Notes (Signed)
Subjective:  Patient ID: Rodney Delacruz, male    DOB: [birth year]  Age: 82 y.o. MRN: [id number]  CC: Follow up   HPI:  82 year old male with hypertension, hyperlipidemia, CAD status post PCI and CABG, gout presents for follow-up.  Hypertension  Has been stable but his blood pressure is elevated today.  He is taking metoprolol and ramipril.  No reports of side effects from medications.  CAD  Doing very well at this time. No reports of chest pain or shortness of breath.  He is exercising regularly without difficulty.  He is followed by cardiology.  He is on aspirin, statin, metoprolol, and ramipril.  Hyperlipidemia  Goal LDL less than 70 given cardiac disease.  His last prior LDL was 82.  He is currently on Lipitor 20 mg daily.  In need of laboratory studies.  Gout  Recent flare per his report.  Uses naproxen infrequently.  Needs refill.  Social Hx   Social History   Social History  . Marital status: Married    Spouse name: Rodney Delacruz  . Number of children: 2  . Years of education: 12   Occupational History  . 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 Smoking status: Former Smoker    Packs/day: 1.00    Years: 20.00    Types: Cigarettes    Quit date: 08/15/1976  . Smokeless tobacco: Never Used  . Alcohol use 0.0 oz/week     Comment: Occasional  . Drug use: No  . Sexual activity: Not Asked   Other Topics Concern  . None   Social History Narrative   [name] grew up in [place], [place]. He currently lives in [place] with his wife, Rodney Delacruz, of 14 years. He was married previously to his high school sweetheart. They were married for 33 years. He widowed in 1993. They had two son Rodney Delacruz and Rodney Delacruz). [name] sons live locally [facility] and [place]). He worked at [employer] and retired from [employer]. He was there for 20 years. He worked in Designer, television/film set. He enjoys fishing, [place], watching  TV on his spare time. He also enjoys walking and visiting friends.   Review of Systems  Constitutional: Negative.   Respiratory: Positive for cough.   Cardiovascular: Negative.    Objective:  BP (!) 155/63 (BP Location: Left Arm, Patient Position: Sitting, Cuff Size: Normal)   Pulse 65   Temp 98.2 F (36.8 C) (Oral)   Resp 14   Ht 6' (1.829 m)   Wt 188 lb 6 oz (85.4 kg)   SpO2 96%   BMI 25.55 kg/m   BP/Weight 07/20/2016 02/02/2016 0000000  Systolic BP 99991111 [id number]  Diastolic BP 63 60 58  Wt. (Lbs) 188.38 187 187  BMI 25.55 25.36 25.36   Physical Exam  Constitutional: He is oriented to person, place, and time. He appears well-developed. No distress.  Cardiovascular: Normal rate and regular rhythm.   Pulmonary/Chest: Effort normal and breath sounds normal. He has no wheezes. He has no rales.  Abdominal: Soft. He exhibits no distension. There is no tenderness.  Neurological: He is alert and oriented to person, place, and time.  Psychiatric: He has a normal mood and affect.  Vitals reviewed.  Lab Results  Component Value Date   WBC 8.5 07/27/2015   HGB 13.5 07/27/2015   HCT 40.7 07/27/2015   PLT 273.0 07/27/2015   GLUCOSE 97 07/27/2015   CHOL 142 01/19/2015  TRIG 64.0 01/19/2015   HDL 47.70 01/19/2015   LDLCALC 82 01/19/2015   ALT 15 01/19/2015   ALT 15 01/19/2015   AST 19 01/19/2015   AST 19 01/19/2015   NA 140 07/27/2015   K 4.7 07/27/2015   CL 105 07/27/2015   CREATININE 1.12 07/27/2015   BUN 17 07/27/2015   CO2 29 07/27/2015   INR 1.0 10/01/2012    Assessment & Plan:   Problem List Items Addressed This Visit    Hyperlipidemia    Rechecking lipids today. May need increase in Lipitor.      Relevant Medications   ramipril (ALTACE) 5 MG capsule   Other Relevant Orders   Lipid panel   Gout    Naproxen refilled. Advised against chronic use given cardiac history. Patient aware.      Relevant Orders   Uric acid   Essential hypertension - Primary     BP elevated and again on repeat today. Increasing ramipril 2 family grams daily. Follow-up labs in 7-10 days. BP check in 2 weeks.      Relevant Medications   ramipril (ALTACE) 5 MG capsule   Other Relevant Orders   Comprehensive metabolic panel   CAD (coronary artery disease)    Stable at this time. Continue aspirin, statin, beta blocker, ACE inhibitor.      Relevant Medications   ramipril (ALTACE) 5 MG capsule    Other Visit Diagnoses    Blood glucose elevated       Relevant Orders   Hemoglobin A1c   History of iron deficiency       Relevant Orders   CBC      Meds ordered this encounter  Medications  . naproxen (NAPROSYN) 500 MG tablet    Sig: TAKE 1 TABLET (500 MG TOTAL) BY MOUTH 2 (TWO) TIMES DAILY WITH A MEAL.    Dispense:  30 tablet    Refill:  1  . fluticasone (FLONASE) 50 MCG/ACT nasal spray    Sig: PLACE 1 SPRAY INTO BOTH NOSTRILS DAILY.    Dispense:  16 g    Refill:  2  . ramipril (ALTACE) 5 MG capsule    Sig: Take 1 capsule (5 mg total) by mouth daily.    Dispense:  90 capsule    Refill:  3    Follow-up: BP check in 2 weeks.  [place]

## 2016-07-20 NOTE — Assessment & Plan Note (Signed)
Stable at this time. Continue aspirin, statin, beta blocker, ACE inhibitor.

## 2016-07-20 NOTE — Assessment & Plan Note (Signed)
BP elevated and again on repeat today. Increasing ramipril 2 family grams daily. Follow-up labs in 7-10 days. BP check in 2 weeks.

## 2016-07-20 NOTE — Assessment & Plan Note (Signed)
Rechecking lipids today. May need increase in Lipitor.

## 2016-07-21 ENCOUNTER — Other Ambulatory Visit: Payer: Self-pay | Admitting: Family Medicine

## 2016-07-21 MED ORDER — ATORVASTATIN CALCIUM 40 MG PO TABS: 40.0000 mg | ORAL_TABLET | Freq: Every day | ORAL | 3 refills | Status: DC

## 2016-07-21 NOTE — Telephone Encounter (Signed)
Pt needed the new dosage of atorvastatin sent in.

## 2016-07-28 ENCOUNTER — Other Ambulatory Visit: Payer: Self-pay | Admitting: Radiology

## 2016-07-28 DIAGNOSIS — Z1211 Encounter for screening for malignant neoplasm of colon: Secondary | ICD-10-CM

## 2016-07-29 ENCOUNTER — Ambulatory Visit (INDEPENDENT_AMBULATORY_CARE_PROVIDER_SITE_OTHER): Payer: Medicare Other | Admitting: Cardiovascular Disease

## 2016-07-29 ENCOUNTER — Encounter: Payer: Self-pay | Admitting: Cardiovascular Disease

## 2016-07-29 ENCOUNTER — Other Ambulatory Visit (INDEPENDENT_AMBULATORY_CARE_PROVIDER_SITE_OTHER): Payer: Medicare Other

## 2016-07-29 VITALS — BP 120/60 | HR 64 | Ht 72.0 in | Wt 185.0 lb

## 2016-07-29 DIAGNOSIS — Z1211 Encounter for screening for malignant neoplasm of colon: Secondary | ICD-10-CM

## 2016-07-29 DIAGNOSIS — I25709 Atherosclerosis of coronary artery bypass graft(s), unspecified, with unspecified angina pectoris: Secondary | ICD-10-CM

## 2016-07-29 DIAGNOSIS — Z951 Presence of aortocoronary bypass graft: Secondary | ICD-10-CM | POA: Diagnosis not present

## 2016-07-29 DIAGNOSIS — I2510 Atherosclerotic heart disease of native coronary artery without angina pectoris: Secondary | ICD-10-CM

## 2016-07-29 DIAGNOSIS — E782 Mixed hyperlipidemia: Secondary | ICD-10-CM | POA: Diagnosis not present

## 2016-07-29 DIAGNOSIS — I10 Essential (primary) hypertension: Secondary | ICD-10-CM

## 2016-07-29 LAB — FECAL OCCULT BLOOD, IMMUNOCHEMICAL: Fecal Occult Bld: NEGATIVE

## 2016-07-29 NOTE — Progress Notes (Signed)
In Cardiology Office Note  Date:  07/29/2016   ID:  BARNES SCHONBERGER, DOB [birth year], MRN [id number]  PCP:  Coral Spikes, DO   Chief Complaint  Patient presents with  . other    6 month follow up. Meds reviewed by the pt. verbally. Pt. c/o elevated blood pressure last week.     HPI:  82 year old male with coronary artery disease, prior PCI and bypass surgery in 2008 at [facility], presenting to [facility] for chest pain on 10/30/2012. He ruled out for MI and had a Myoview study that showed no ischemia. history of GERD. He does have prior smoking history, stopped in the 70s, no diabetes He presents today for routine followup of his coronary artery disease  In follow-up today, he reports that he is doing well.  Reports blood pressure was running high when he saw Dr. Lacinda Axon recently He has not been taking his statin the past several months Walks in [place] no chest pain, Having  chest discomfort when he walks in the afternoon. This is been a chronic issue Wonders if he should take his medications in the morning, "something wearing off by the afternoon"   Otherwise reports he feels well Active at baseline Reports that he had a rash on Plavix, only takes aspirin  Cholesterol 170s on no Lipitor Previously was taking 20 mgrams daily  EKG on today's visit showing normal sinus rhythm with rate 64 bpm, no significant ST or T-wave changes  Other past medical history Cardiac catheterization June 2009 The shows 90% distal left main disease, 70% ostial LAD disease, 100% mid LAD at the site of a prior stent, 100% distal LAD disease at the site of prior stent, D1 with 60% disease, proximal circumflex with 40% disease, mid circumflex with 50%, OM1 with 30% disease, proximal RCA with 30% followed by 40% followed by 60% disease, 50% distal PDA disease  PMH:   has a past medical history of Allergy; Anxiety; Coronary artery disease; Depression; Elevated PSA; Heart murmur; Hyperlipidemia; Hypertension; MI  (myocardial infarction) (2009); and Ulcer ([place]).  PSH:    Past Surgical History:  Procedure Laterality Date  . CARDIAC CATHETERIZATION  2005, 2009  . CHOLECYSTECTOMY  2001  . CORONARY ANGIOPLASTY  2005   s/p stent placement @ [facility]  . CORONARY ARTERY BYPASS GRAFT  2009   [facility]  . TONSILLECTOMY AND ADENOIDECTOMY  1942    Current Outpatient Prescriptions  Medication Sig Dispense Refill  . aspirin 81 MG tablet Take 81 mg by mouth daily.    Marland Kitchen atorvastatin (LIPITOR) 40 MG tablet Take 1 tablet (40 mg total) by mouth daily. 90 tablet 3  . buPROPion (WELLBUTRIN XL) 300 MG 24 hr tablet Take 300 mg by mouth daily.    . fluticasone (FLONASE) 50 MCG/ACT nasal spray PLACE 1 SPRAY INTO BOTH NOSTRILS DAILY. 16 g 2  . metoprolol succinate (TOPROL-XL) 25 MG 24 hr tablet TAKE 1 TABLET BY MOUTH EVERY DAY 90 tablet 3  . Multiple Vitamin (MULTI-VITAMIN DAILY PO) Take by mouth.    . naproxen (NAPROSYN) 500 MG tablet TAKE 1 TABLET (500 MG TOTAL) BY MOUTH 2 (TWO) TIMES DAILY WITH A MEAL. 30 tablet 1  . nitroGLYCERIN (NITROSTAT) 0.4 MG SL tablet Place 1 tablet (0.4 mg total) under the tongue every 5 (five) minutes as needed for chest pain. 25 tablet 11  . omeprazole (PRILOSEC) 20 MG capsule TAKE ONE CAPSULE BY MOUTH EVERY DAY 90 capsule 3  . ramipril (ALTACE) 5 MG capsule Take  1 capsule (5 mg total) by mouth daily. 90 capsule 3   No current facility-administered medications for this visit.      Allergies:   Plavix [clopidogrel bisulfate]   Social History:  The patient  reports that he quit smoking about 39 years ago. His smoking use included Cigarettes. He has a 20.00 pack-year smoking history. He has never used smokeless tobacco. He reports that he drinks alcohol. He reports that he does not use drugs.   Family History:   family history includes Alcohol abuse in his father and paternal grandfather; Diabetes in his sister; Heart attack in his brother; Heart disease in his brother; Stroke in his mother.     Review of Systems: Review of Systems  Constitutional: Negative.   Respiratory: Negative.   Cardiovascular: Positive for chest pain.  Gastrointestinal: Negative.   Musculoskeletal: Negative.   Neurological: Negative.   Psychiatric/Behavioral: Negative.   All other systems reviewed and are negative.    PHYSICAL EXAM: VS:  BP 120/60 (BP Location: Left Arm, Patient Position: Sitting, Cuff Size: Normal)   Pulse 64   Ht 6' (1.829 m)   Wt 185 lb (83.9 kg)   BMI 25.09 kg/m  , BMI Body mass index is 25.09 kg/m. GEN: Well nourished, well developed, in no acute distress  HEENT: normal  Neck: no JVD, carotid bruits, or masses Cardiac: RRR; no murmurs, rubs, or gallops,no edema  Respiratory:  clear to auscultation bilaterally, normal work of breathing GI: soft, nontender, nondistended, + BS MS: no deformity or atrophy  Skin: warm and dry, no rash Neuro:  Strength and sensation are intact Psych: euthymic mood, full affect    Recent Labs: 07/20/2016: ALT 15; BUN 20; Creatinine, Ser 1.13; Hemoglobin 12.1; Platelets 291.0; Potassium 4.8; Sodium 141    Lipid Panel Lab Results  Component Value Date   CHOL 171 07/20/2016   HDL 46.30 07/20/2016   LDLCALC 111 (H) 07/20/2016   TRIG 70.0 07/20/2016      Wt Readings from Last 3 Encounters:  07/29/16 185 lb (83.9 kg)  07/20/16 188 lb 6 oz (85.4 kg)  02/02/16 187 lb (84.8 kg)       ASSESSMENT AND PLAN:  Essential hypertension - Plan: EKG 12-Lead Blood pressure is well controlled on today's visit. No changes made to the medications.  S/P CABG (coronary artery bypass graft) - Plan: EKG 12-Lead Chest pain in the afternoon, chronic stable angina Reports it is been this way for several years Recommended he take metoprolol in the morning If symptoms get worse or bothersome, further testing could be done Little room on his blood pressure to add isosorbide Suggested he take sublingual nitroglycerin as needed for severe  symptoms  Coronary artery disease involving native coronary artery of native heart without angina pectoris As above, stable angina when walking in the afternoon We'll try metoprolol in the morning  Mixed hyperlipidemia Has not been taking his statin Recommended he alternate 40 mg with 20 mg Lipitor   Total encounter time more than 25 minutes  Greater than 50% was spent in counseling and coordination of care with the patient   Disposition:   F/U  6 months   Orders Placed This Encounter  Procedures  . EKG 12-Lead     Signed, Esmond Plants, M.D., Ph.D. 07/29/2016  [place], [place]

## 2016-07-29 NOTE — Patient Instructions (Addendum)
Medication Instructions:   Please take the metoprolol in the Am Restart the lipitor   Call the office if chest pain in the afternoon gets worse  Monitor blood pressure at home  Labwork:  No new labs needed  Testing/Procedures:  No further testing at this time   I recommend watching educational videos on topics of interest to you at:       www.goemmi.com  Enter code: HEARTCARE    Follow-Up: It was a pleasure seeing you in the office today. Please call us if you have new issues that need to be addressed before your next appt.  [phone]  Your physician wants you to follow-up in: 6 months.  You will receive a reminder letter in the mail two months in advance. If you don't receive a letter, please call our office to schedule the follow-up appointment.  If you need a refill on your cardiac medications before your next appointment, please call your pharmacy.

## 2016-07-30 ENCOUNTER — Encounter: Payer: Self-pay | Admitting: *Deleted

## 2016-08-02 ENCOUNTER — Other Ambulatory Visit: Payer: Self-pay | Admitting: Family Medicine

## 2016-08-02 ENCOUNTER — Telehealth: Payer: Self-pay

## 2016-08-02 DIAGNOSIS — D649 Anemia, unspecified: Secondary | ICD-10-CM

## 2016-08-02 NOTE — Telephone Encounter (Signed)
Pt coming for labs 08/03/16. I am guessing they are repeat labs, but unsure what we are repeating. Please place future order. Thank you.

## 2016-08-03 ENCOUNTER — Other Ambulatory Visit (INDEPENDENT_AMBULATORY_CARE_PROVIDER_SITE_OTHER): Payer: Medicare Other

## 2016-08-03 ENCOUNTER — Ambulatory Visit (INDEPENDENT_AMBULATORY_CARE_PROVIDER_SITE_OTHER): Payer: Medicare Other

## 2016-08-03 VITALS — BP 132/62 | HR 70 | Resp 16

## 2016-08-03 DIAGNOSIS — I10 Essential (primary) hypertension: Secondary | ICD-10-CM

## 2016-08-03 DIAGNOSIS — D649 Anemia, unspecified: Secondary | ICD-10-CM | POA: Diagnosis not present

## 2016-08-03 LAB — CBC
HCT: 37.6 % — ABNORMAL LOW (ref 39.0–52.0)
Hemoglobin: 12.4 g/dL — ABNORMAL LOW (ref 13.0–17.0)
MCHC: 33.1 g/dL (ref 30.0–36.0)
MCV: 96.8 fl (ref 78.0–100.0)
Platelets: 272 10*3/uL (ref 150.0–400.0)
RBC: 3.88 Mil/uL — ABNORMAL LOW (ref 4.22–5.81)
RDW: 13.9 % (ref 11.5–15.5)
WBC: 9.2 10*3/uL (ref 4.0–10.5)

## 2016-08-03 NOTE — Progress Notes (Signed)
Patient comes in for 2 week blood pressure check.   He states last couple of times when he checked blood pressure reading were 1339/64 or 142-144/64.   Please advise.

## 2016-08-03 NOTE — Progress Notes (Signed)
BP fairly well controlled.  Continue close monitoring.

## 2016-08-04 LAB — IRON,TIBC AND FERRITIN PANEL
%SAT: 25 % (ref 15–60)
Ferritin: 31 ng/mL (ref 20–380)
Iron: 78 ug/dL (ref 50–180)
TIBC: 311 ug/dL (ref 250–425)

## 2016-08-24 ENCOUNTER — Telehealth: Payer: Self-pay | Admitting: Family Medicine

## 2016-08-24 NOTE — Telephone Encounter (Signed)
Pt declined having the AWV. Thank you!

## 2016-08-28 ENCOUNTER — Other Ambulatory Visit: Payer: Self-pay | Admitting: Family Medicine

## 2016-09-01 ENCOUNTER — Other Ambulatory Visit: Payer: Self-pay | Admitting: Cardiovascular Disease

## 2016-09-06 ENCOUNTER — Other Ambulatory Visit: Payer: Self-pay | Admitting: Cardiovascular Disease

## 2016-09-21 ENCOUNTER — Other Ambulatory Visit: Payer: Self-pay | Admitting: Family Medicine

## 2016-10-05 ENCOUNTER — Telehealth: Payer: Self-pay | Admitting: Family Medicine

## 2016-10-05 NOTE — Telephone Encounter (Signed)
I called pt and left a vm to call the office to sch AWV. Thank you! °

## 2016-10-19 ENCOUNTER — Ambulatory Visit (INDEPENDENT_AMBULATORY_CARE_PROVIDER_SITE_OTHER): Payer: Medicare Other | Admitting: Family Medicine

## 2016-10-19 ENCOUNTER — Encounter: Payer: Self-pay | Admitting: Family Medicine

## 2016-10-19 VITALS — BP 160/77 | HR 64 | Temp 98.0°F | Wt 188.0 lb

## 2016-10-19 DIAGNOSIS — R7303 Prediabetes: Secondary | ICD-10-CM | POA: Diagnosis not present

## 2016-10-19 DIAGNOSIS — E785 Hyperlipidemia, unspecified: Secondary | ICD-10-CM

## 2016-10-19 DIAGNOSIS — D649 Anemia, unspecified: Secondary | ICD-10-CM

## 2016-10-19 DIAGNOSIS — I2510 Atherosclerotic heart disease of native coronary artery without angina pectoris: Secondary | ICD-10-CM

## 2016-10-19 DIAGNOSIS — I10 Essential (primary) hypertension: Secondary | ICD-10-CM

## 2016-10-19 LAB — LIPID PANEL
Cholesterol: 129 mg/dL (ref 0–200)
HDL: 47.5 mg/dL (ref >39.00)
LDL Cholesterol: 71 mg/dL (ref 0–99)
NonHDL: 81.47
Total CHOL/HDL Ratio: 3
Triglycerides: 54 mg/dL (ref 0.0–149.0)
VLDL: 10.8 mg/dL (ref 0.0–40.0)

## 2016-10-19 LAB — CBC
HCT: 37.1 % — ABNORMAL LOW (ref 39.0–52.0)
Hemoglobin: 12.3 g/dL — ABNORMAL LOW (ref 13.0–17.0)
MCHC: 33.2 g/dL (ref 30.0–36.0)
MCV: 98.3 fl (ref 78.0–100.0)
Platelets: 271 10*3/uL (ref 150.0–400.0)
RBC: 3.77 Mil/uL — ABNORMAL LOW (ref 4.22–5.81)
RDW: 13.7 % (ref 11.5–15.5)
WBC: 8.7 10*3/uL (ref 4.0–10.5)

## 2016-10-19 LAB — COMPREHENSIVE METABOLIC PANEL
ALT: 16 U/L (ref 0–53)
AST: 18 U/L (ref 0–37)
Albumin: 4 g/dL (ref 3.5–5.2)
Alkaline Phosphatase: 75 U/L (ref 39–117)
BUN: 18 mg/dL (ref 6–23)
CO2: 29 mEq/L (ref 19–32)
Calcium: 9.2 mg/dL (ref 8.4–10.5)
Chloride: 106 mEq/L (ref 96–112)
Creatinine, Ser: 1.18 mg/dL (ref 0.40–1.50)
GFR: 63.02 mL/min (ref >60.00)
Glucose, Bld: 99 mg/dL (ref 70–99)
Potassium: 4.5 mEq/L (ref 3.5–5.1)
Sodium: 140 mEq/L (ref 135–145)
Total Bilirubin: 0.6 mg/dL (ref 0.2–1.2)
Total Protein: 6.8 g/dL (ref 6.0–8.3)

## 2016-10-19 LAB — HEMOGLOBIN A1C: Hgb A1c MFr Bld: 6 % (ref 4.6–6.5)

## 2016-10-19 NOTE — Assessment & Plan Note (Signed)
Lipid panel today to reevaluate given recent increase in therapy. Continue Lipitor 40.

## 2016-10-19 NOTE — Progress Notes (Signed)
Pre visit review using our clinic review tool, if applicable. No additional management support is needed unless otherwise documented below in the visit note. 

## 2016-10-19 NOTE — Assessment & Plan Note (Addendum)
BP elevated today. He has been well controlled per his home readings. We'll continue to monitor closely. Continue ramipril and metoprolol.

## 2016-10-19 NOTE — Patient Instructions (Signed)
Continue your meds.  We will call your lab results.  Follow up in 6 months  Take care  Dr. Lacinda Axon

## 2016-10-19 NOTE — Assessment & Plan Note (Signed)
Stable. Continue atenolol, ramipril, aspirin, statin.

## 2016-10-19 NOTE — Progress Notes (Signed)
Subjective:  Patient ID: Rodney Delacruz, male    DOB: May 07, 1934  Age: 83 y.o. MRN: [id number]  CC: Follow up  HPI:  83 year old male with CAD, hypertension, hyperlipidemia, anemia presents for follow-up.  CAD  Currently symptomatically. Stable at this time on metoprolol, ramipril, aspirin, Lipitor.  Hypertension  Has been well-controlled  Elevated today.  He endorses compliance with his metoprolol as well as ramipril.  HLD  These rechecking lipids given increased dose of Lipitor.  Endorses compliance.  Social Hx   Social History   Social History  . Marital status: Married    Spouse name: Rodney Delacruz  . Number of children: 2  . Years of education: 12   Occupational History  . 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 Smoking status: Former Smoker    Packs/day: 1.00    Years: 20.00    Types: Cigarettes    Quit date: 08/15/1976  . Smokeless tobacco: Never Used  . Alcohol use 0.0 oz/week     Comment: Occasional  . Drug use: No  . Sexual activity: Not Asked   Other Topics Concern  . None   Social History Narrative   Mr. Rodney Delacruz grew up in [place], [place]. He currently lives in [place] with his wife, Rodney Delacruz, of 14 years. He was married previously to his high school sweetheart. They were married for 33 years. He widowed in 1993. They had two son Rodney Delacruz and Rodney Delacruz). Mr. Rodney Delacruz sons live locally [place] Delacruz and Rodney Delacruz). He worked at [employer] and retired from [employer]. He was there for 20 years. He worked in Designer, television/film set. He enjoys fishing, Delacruz, watching TV on his spare time. He also enjoys walking and visiting friends.    Review of Systems  Constitutional: Negative.   Respiratory: Negative.   Cardiovascular: Negative.    Objective:  BP (!) 160/77 (BP Location: Left Arm, Cuff Size: Normal)   Pulse 64   Temp 98 F (36.7 C) (Oral)   Wt 188 lb (85.3 kg)   SpO2 99%   BMI 25.50 kg/m    BP/Weight 10/19/2016 08/03/2016 94/49/6759  Systolic BP 163 846 659  Diastolic BP 77 62 60  Wt. (Lbs) 188 - 185  BMI 25.5 - 25.09    Physical Exam  Constitutional: He is oriented to person, place, and time. He appears well-developed. No distress.  Cardiovascular: Normal rate and regular rhythm.   Pulmonary/Chest: Effort normal.  Neurological: He is alert and oriented to person, place, and time.  Psychiatric: He has a normal mood and affect.  Vitals reviewed.  Lab Results  Component Value Date   WBC 9.2 08/03/2016   HGB 12.4 (L) 08/03/2016   HCT 37.6 (L) 08/03/2016   PLT 272.0 08/03/2016   GLUCOSE 87 07/20/2016   CHOL 171 07/20/2016   TRIG 70.0 07/20/2016   HDL 46.30 07/20/2016   LDLCALC 111 (H) 07/20/2016   ALT 15 07/20/2016   AST 18 07/20/2016   NA 141 07/20/2016   K 4.8 07/20/2016   CL 106 07/20/2016   CREATININE 1.13 07/20/2016   BUN 20 07/20/2016   CO2 29 07/20/2016   INR 1.0 10/01/2012   HGBA1C 5.8 07/20/2016    Assessment & Plan:   Problem List Items Addressed This Visit    CAD (coronary artery disease) - Primary    Stable. Continue atenolol, ramipril, aspirin, statin.      Essential hypertension    BP elevated  today. He has been well controlled per his home readings. We'll continue to monitor closely. Continue ramipril and metoprolol.       Relevant Orders   Comprehensive metabolic panel   Hyperlipidemia    Lipid panel today to reevaluate given recent increase in therapy. Continue Lipitor 40.      Relevant Orders   Lipid panel    Other Visit Diagnoses    Anemia, unspecified type       Relevant Orders   CBC   Prediabetes       Relevant Orders   Hemoglobin A1c     Follow-up: 6 months  [place] DO [facility]

## 2016-11-21 ENCOUNTER — Other Ambulatory Visit: Payer: Self-pay | Admitting: Family Medicine

## 2016-11-21 DIAGNOSIS — C44612 Basal cell carcinoma of skin of right upper limb, including shoulder: Secondary | ICD-10-CM | POA: Diagnosis not present

## 2016-11-21 DIAGNOSIS — L570 Actinic keratosis: Secondary | ICD-10-CM | POA: Diagnosis not present

## 2016-11-21 DIAGNOSIS — D485 Neoplasm of uncertain behavior of skin: Secondary | ICD-10-CM | POA: Diagnosis not present

## 2016-12-03 ENCOUNTER — Other Ambulatory Visit: Payer: Self-pay | Admitting: Cardiovascular Disease

## 2017-01-30 ENCOUNTER — Encounter: Payer: Self-pay | Admitting: Cardiovascular Disease

## 2017-01-30 ENCOUNTER — Ambulatory Visit (INDEPENDENT_AMBULATORY_CARE_PROVIDER_SITE_OTHER): Payer: Medicare Other | Admitting: Cardiovascular Disease

## 2017-01-30 VITALS — BP 132/58 | HR 61 | Ht 72.0 in | Wt 186.2 lb

## 2017-01-30 DIAGNOSIS — E782 Mixed hyperlipidemia: Secondary | ICD-10-CM

## 2017-01-30 DIAGNOSIS — I10 Essential (primary) hypertension: Secondary | ICD-10-CM

## 2017-01-30 DIAGNOSIS — I2510 Atherosclerotic heart disease of native coronary artery without angina pectoris: Secondary | ICD-10-CM | POA: Diagnosis not present

## 2017-01-30 DIAGNOSIS — Z951 Presence of aortocoronary bypass graft: Secondary | ICD-10-CM

## 2017-01-30 MED ORDER — ROSUVASTATIN CALCIUM 10 MG PO TABS: 10.0000 mg | ORAL_TABLET | Freq: Every day | ORAL | 3 refills | Status: DC

## 2017-01-30 NOTE — Patient Instructions (Signed)
Medication Instructions:   Please hold the lipitor Start crestor 1/2 pill for one month Increase up to a full pill it tolerate  Labwork:  No new labs needed  Testing/Procedures:  No further testing at this time   I recommend watching educational videos on topics of interest to you at:       [email or web address].com  Enter code: [email or web address]    Follow-Up: It was a pleasure seeing you in the office today. Please call us if you have new issues that need to be addressed before your next appt.  [phone]  Your physician wants you to follow-up in: 12 months.  You will receive a reminder letter in the mail two months in advance. If you don't receive a letter, please call our office to schedule the follow-up appointment.  If you need a refill on your cardiac medications before your next appointment, please call your pharmacy.

## 2017-01-30 NOTE — Progress Notes (Signed)
In Cardiology Office Note  Date:  01/30/2017   ID:  EREN Delacruz, DOB [birth year], MRN [id number]  PCP:  Coral Spikes, DO   Chief Complaint  Patient presents with  . other    6 month follow up. Patient c/o SOB and chest pain. Meds reviewed verbally with patient.     HPI:  83 year old male with  coronary artery disease, prior PCI and bypass surgery in 2008 at [facility],  presenting to [facility] for chest pain on 10/30/2012.  ruled out for MI and had a Myoview study that showed no ischemia.  GERD.  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 Blood pressure running within a reasonable range Otherwise reports he feels well Active at baseline Reports that he had a rash on Plavix, only takes aspirin  Cholesterol was 126 LDL 71 on Lipitor 20 mg daily  EKG on today's visit showing normal sinus rhythm with rate 61 bpm, no significant ST or T-wave changes  Other past medical history Cardiac catheterization June 2009 The shows 90% distal left main disease, 70% ostial LAD disease, 100% mid LAD at the site of a prior stent, 100% distal LAD disease at the site of prior stent, D1 with 60% disease, proximal circumflex with 40% disease, mid circumflex with 50%, OM1 with 30% disease, proximal RCA with 30% followed by 40% followed by 60% disease, 50% distal PDA disease  PMH:   has a past medical history of Allergy; Anxiety; Coronary artery disease; Depression; Elevated PSA; Heart murmur; Hyperlipidemia; Hypertension; MI (myocardial infarction) ([place]) (2009); and Ulcer.  PSH:    Past Surgical History:  Procedure  Laterality Date  . CARDIAC CATHETERIZATION  2005, 2009  . CHOLECYSTECTOMY  2001  . CORONARY ANGIOPLASTY  2005   s/p stent placement @ [facility]  . CORONARY ARTERY BYPASS GRAFT  2009   [facility]  . TONSILLECTOMY AND ADENOIDECTOMY  1942    Current Outpatient Prescriptions  Medication Sig Dispense Refill  . aspirin 81 MG tablet Take 81 mg by mouth daily.    Marland Kitchen atorvastatin (LIPITOR) 40 MG tablet Take 1 tablet (40 mg total) by mouth daily. 90 tablet 3  . buPROPion (WELLBUTRIN XL) 300 MG 24 hr tablet Take 300 mg by mouth daily.    . fluticasone (FLONASE) 50 MCG/ACT nasal spray USE 1 SPRAY IN BOTH NOSTRILS DAILY 16 g 2  . metoprolol succinate (TOPROL-XL) 25 MG 24 hr tablet TAKE 1 TABLET BY MOUTH EVERY DAY 90 tablet 3  . Multiple Vitamin (MULTI-VITAMIN DAILY PO) Take by mouth.    . naproxen (NAPROSYN) 500 MG tablet TAKE 1 TABLET (500 MG TOTAL) BY MOUTH 2 (TWO) TIMES DAILY WITH A MEAL. 30 tablet 1  . nitroGLYCERIN (NITROSTAT) 0.4 MG SL tablet PLACE 1 TABLET (0.4 MG TOTAL) UNDER THE TONGUE EVERY 5 (FIVE) MINUTES AS NEEDED FOR CHEST PAIN. 25 tablet 1  . omeprazole (PRILOSEC) 20 MG capsule TAKE ONE CAPSULE BY MOUTH EVERY DAY 90 capsule 3  . ramipril (ALTACE) 5 MG capsule Take 1 capsule (5 mg total) by mouth daily. 90 capsule 3   No current facility-administered medications for this visit.  Allergies:   Plavix [clopidogrel bisulfate]   Social History:  The patient  reports that he quit smoking about 40 years ago. His smoking use included Cigarettes. He has a 20.00 pack-year smoking history. He has never used smokeless tobacco. He reports that he drinks alcohol. He reports that he does not use drugs.   Family History:   family history includes Alcohol abuse in his father and paternal grandfather; Diabetes in his sister; Heart attack in his brother; Heart disease in his brother; Stroke in his mother.    Review of Systems: Review of Systems  Constitutional: Negative.   Respiratory: Negative.    Gastrointestinal: Negative.   Musculoskeletal: Negative.   Neurological: Negative.   Psychiatric/Behavioral: Negative.   All other systems reviewed and are negative.    PHYSICAL EXAM: VS:  BP (!) 132/58 (BP Location: Left Arm, Patient Position: Sitting, Cuff Size: Normal)   Pulse 61   Ht 6' (1.829 m)   Wt 186 lb 4 oz (84.5 kg)   BMI 25.26 kg/m  , BMI Body mass index is 25.26 kg/m. GEN: Well nourished, well developed, in no acute distress  HEENT: normal  Neck: no JVD, carotid bruits, or masses Cardiac: RRR; no murmurs, rubs, or gallops,no edema  Respiratory:  clear to auscultation bilaterally, normal work of breathing GI: soft, nontender, nondistended, + BS MS: no deformity or atrophy  Skin: warm and dry, no rash Neuro:  Strength and sensation are intact Psych: euthymic mood, full affect    Recent Labs: 10/19/2016: ALT 16; BUN 18; Creatinine, Ser 1.18; Hemoglobin 12.3; Platelets 271.0; Potassium 4.5; Sodium 140    Lipid Panel Lab Results  Component Value Date   CHOL 129 10/19/2016   HDL 47.50 10/19/2016   LDLCALC 71 10/19/2016   TRIG 54.0 10/19/2016      Wt Readings from Last 3 Encounters:  01/30/17 186 lb 4 oz (84.5 kg)  10/19/16 188 lb (85.3 kg)  07/29/16 185 lb (83.9 kg)       ASSESSMENT AND PLAN:  Essential hypertension - Plan: EKG 12-Lead Blood pressure is well controlled on today's visit. No changes made to the medications.  S/P CABG (coronary artery bypass graft) - Plan: EKG 12-Lead Stable anginal symptoms improved, no changes to his medications No further testing  Coronary artery disease involving native coronary artery of native heart without angina pectoris As above, feels well, He is willing to try alternate statin  Mixed hyperlipidemia Long discussion concerning the benefits of statins We will try Crestor 5 mg for one month titrating up to 10 mg daily   Total encounter time more than 25 minutes  Greater than 50% was spent in counseling  and coordination of care with the patient   Disposition:   F/U  12 months   No orders of the defined types were placed in this encounter.    Signed, Rodney Delacruz, M.D., Ph.D. 01/30/2017  [place], [place]

## 2017-03-09 DIAGNOSIS — C44612 Basal cell carcinoma of skin of right upper limb, including shoulder: Secondary | ICD-10-CM | POA: Diagnosis not present

## 2017-03-16 ENCOUNTER — Other Ambulatory Visit: Payer: Self-pay | Admitting: Family Medicine

## 2017-04-14 DIAGNOSIS — N529 Male erectile dysfunction, unspecified: Secondary | ICD-10-CM | POA: Diagnosis not present

## 2017-04-19 ENCOUNTER — Other Ambulatory Visit: Payer: Self-pay

## 2017-04-19 MED ORDER — FLUTICASONE PROPIONATE 50 MCG/ACT NA SUSP: NASAL | 3 refills | Status: DC

## 2017-04-24 ENCOUNTER — Ambulatory Visit (INDEPENDENT_AMBULATORY_CARE_PROVIDER_SITE_OTHER): Payer: Medicare Other | Admitting: Family Medicine

## 2017-04-24 ENCOUNTER — Encounter: Payer: Self-pay | Admitting: Family Medicine

## 2017-04-24 VITALS — BP 142/70 | HR 62 | Temp 98.2°F | Resp 12 | Wt 187.0 lb

## 2017-04-24 DIAGNOSIS — N529 Male erectile dysfunction, unspecified: Secondary | ICD-10-CM | POA: Diagnosis not present

## 2017-04-24 DIAGNOSIS — R7303 Prediabetes: Secondary | ICD-10-CM

## 2017-04-24 DIAGNOSIS — I10 Essential (primary) hypertension: Secondary | ICD-10-CM

## 2017-04-24 DIAGNOSIS — D649 Anemia, unspecified: Secondary | ICD-10-CM | POA: Diagnosis not present

## 2017-04-24 DIAGNOSIS — E782 Mixed hyperlipidemia: Secondary | ICD-10-CM | POA: Diagnosis not present

## 2017-04-24 DIAGNOSIS — I2510 Atherosclerotic heart disease of native coronary artery without angina pectoris: Secondary | ICD-10-CM

## 2017-04-24 DIAGNOSIS — Z23 Encounter for immunization: Secondary | ICD-10-CM

## 2017-04-24 LAB — LIPID PANEL
Cholesterol: 136 mg/dL (ref 0–200)
HDL: 49 mg/dL (ref >39.00)
LDL Cholesterol: 73 mg/dL (ref 0–99)
NonHDL: 86.78
Total CHOL/HDL Ratio: 3
Triglycerides: 69 mg/dL (ref 0.0–149.0)
VLDL: 13.8 mg/dL (ref 0.0–40.0)

## 2017-04-24 LAB — COMPREHENSIVE METABOLIC PANEL
ALT: 13 U/L (ref 0–53)
AST: 17 U/L (ref 0–37)
Albumin: 4 g/dL (ref 3.5–5.2)
Alkaline Phosphatase: 70 U/L (ref 39–117)
BUN: 16 mg/dL (ref 6–23)
CO2: 28 mEq/L (ref 19–32)
Calcium: 9.3 mg/dL (ref 8.4–10.5)
Chloride: 104 mEq/L (ref 96–112)
Creatinine, Ser: 1.27 mg/dL (ref 0.40–1.50)
GFR: 57.82 mL/min — ABNORMAL LOW (ref >60.00)
Glucose, Bld: 99 mg/dL (ref 70–99)
Potassium: 4.7 mEq/L (ref 3.5–5.1)
Sodium: 139 mEq/L (ref 135–145)
Total Bilirubin: 0.6 mg/dL (ref 0.2–1.2)
Total Protein: 6.8 g/dL (ref 6.0–8.3)

## 2017-04-24 LAB — CBC
HCT: 38.5 % — ABNORMAL LOW (ref 39.0–52.0)
Hemoglobin: 12.6 g/dL — ABNORMAL LOW (ref 13.0–17.0)
MCHC: 32.7 g/dL (ref 30.0–36.0)
MCV: 100.7 fl — ABNORMAL HIGH (ref 78.0–100.0)
Platelets: 265 10*3/uL (ref 150.0–400.0)
RBC: 3.82 Mil/uL — ABNORMAL LOW (ref 4.22–5.81)
RDW: 13.7 % (ref 11.5–15.5)
WBC: 8.3 10*3/uL (ref 4.0–10.5)

## 2017-04-24 LAB — HEMOGLOBIN A1C: Hgb A1c MFr Bld: 5.8 % (ref 4.6–6.5)

## 2017-04-24 MED ORDER — SILDENAFIL CITRATE 50 MG PO TABS: 50.0000 mg | ORAL_TABLET | Freq: Every day | ORAL | 3 refills | Status: DC | PRN

## 2017-04-24 NOTE — Assessment & Plan Note (Signed)
Stable. Continue aspirin, statin, metoprolol, ramipril.

## 2017-04-24 NOTE — Assessment & Plan Note (Signed)
Stable on ramipril metoprolol. Continue. Labs today.

## 2017-04-24 NOTE — Assessment & Plan Note (Signed)
Rx for Viagra given.

## 2017-04-24 NOTE — Progress Notes (Signed)
Subjective:  Patient ID: Rodney Delacruz, male    DOB: 09/29/1933  Age: 84 y.o. MRN: [id number]  CC: Follow up  HPI:  83 year old male with CAD, hypertension, hyperlipidemia presents for follow-up.  CAD  Stable. Compliant with ramipril, metoprolol, Crestor, aspirin.  Hypertension   Stable on metoprolol, and ramipril.   HLD  Stable on Crestor.  Erectile dysfunction.  Patient requesting Viagra today.   Social Hx   Social History   Social History  . Marital status: Married    Spouse name: Rodney Delacruz  . Number of children: 2  . Years of education: 12   Occupational History  . 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 Smoking status: Former Smoker    Packs/day: 1.00    Years: 20.00    Types: Cigarettes    Quit date: 08/15/1976  . Smokeless tobacco: Never Used  . Alcohol use 0.0 oz/week     Comment: Occasional  . Drug use: No  . Sexual activity: Not Asked   Other Topics Concern  . None   Social History Narrative   Rodney Delacruz grew up in [place], [place]. He currently lives in [place] with his wife, Rodney Delacruz, of 14 years. He was married previously to his high school sweetheart. They were married for 33 years. He widowed in 1993. They had two son Rodney Delacruz and Rodney Delacruz). Rodney Delacruz sons live locally [facility] and [place]). He worked at [employer] and retired from [employer]. He was there for 20 years. He worked in Designer, television/film set. He enjoys fishing, Delac[place], watching TV on his spare time. He also enjoys walking and visiting friends.    Review of Systems  Constitutional: Negative.   Respiratory: Negative.   Cardiovascular: Negative.    Objective:  BP (!) 142/70 (BP Location: Left Arm, Patient Position: Sitting, Cuff Size: Normal)   Pulse 62   Temp 98.2 F (36.8 C) (Oral)   Resp 12   Wt 187 lb (84.8 kg)   SpO2 96%   BMI 25.36 kg/m   BP/Weight 04/24/2017 0/17/4944 04/20/7590  Systolic BP 638 466 599  Diastolic  BP 70 58 77  Wt. (Lbs) 187 186.25 188  BMI 25.36 25.26 25.5    Physical Exam  Constitutional: He is oriented to person, place, and time. He appears well-developed. No distress.  HENT:  Head: Normocephalic and atraumatic.  Eyes: Conjunctivae are normal. No scleral icterus.  Cardiovascular: Normal rate and regular rhythm.   No murmur heard. Pulmonary/Chest: Effort normal. He has no wheezes. He has no rales.  Abdominal: Soft. He exhibits no distension. There is no tenderness.  Neurological: He is alert and oriented to person, place, and time.  Psychiatric: He has a normal mood and affect.  Vitals reviewed.   Lab Results  Component Value Date   WBC 8.7 10/19/2016   HGB 12.3 (L) 10/19/2016   HCT 37.1 (L) 10/19/2016   PLT 271.0 10/19/2016   GLUCOSE 99 10/19/2016   CHOL 129 10/19/2016   TRIG 54.0 10/19/2016   HDL 47.50 10/19/2016   LDLCALC 71 10/19/2016   ALT 16 10/19/2016   AST 18 10/19/2016   NA 140 10/19/2016   K 4.5 10/19/2016   CL 106 10/19/2016   CREATININE 1.18 10/19/2016   BUN 18 10/19/2016   CO2 29 10/19/2016   INR 1.0 10/01/2012   HGBA1C 6.0 10/19/2016    Assessment & Plan:   Problem List Items Addressed This Visit  CAD (coronary artery disease) - Primary    Stable. Continue aspirin, statin, metoprolol, ramipril.      Relevant Medications   sildenafil (VIAGRA) 50 MG tablet   Hyperlipidemia    Stable on Crestor. Lipid panel today.      Relevant Medications   sildenafil (VIAGRA) 50 MG tablet   Other Relevant Orders   Lipid panel   Essential hypertension    Stable on ramipril metoprolol. Continue. Labs today.      Relevant Medications   sildenafil (VIAGRA) 50 MG tablet   Other Relevant Orders   Comprehensive metabolic panel   Erectile dysfunction    Rx for Viagra given.       Other Visit Diagnoses    Encounter for immunization       Relevant Orders   Flu vaccine HIGH DOSE PF (Completed)   Anemia, unspecified type       Relevant Orders    CBC   Prediabetes       Relevant Orders   Hemoglobin A1c      Meds ordered this encounter  Medications  . sildenafil (VIAGRA) 50 MG tablet    Sig: Take 1-2 tablets (50-100 mg total) by mouth daily as needed for erectile dysfunction.    Dispense:  10 tablet    Refill:  3   Follow-up: 6 months  [place] DO [facility]

## 2017-04-24 NOTE — Patient Instructions (Signed)
Continue your meds.  Follow up in 6 months.  Take care  Dr. Alhassan Everingham  

## 2017-04-24 NOTE — Assessment & Plan Note (Signed)
Stable on Crestor. Lipid panel today.

## 2017-06-12 ENCOUNTER — Other Ambulatory Visit: Payer: Self-pay

## 2017-06-12 MED ORDER — OMEPRAZOLE 20 MG PO CPDR: 20.0000 mg | DELAYED_RELEASE_CAPSULE | Freq: Every day | ORAL | 3 refills | Status: DC

## 2017-06-12 NOTE — Telephone Encounter (Signed)
Requested Prescriptions   Signed Prescriptions Disp Refills  . omeprazole (PRILOSEC) 20 MG capsule 90 capsule 3    Sig: Take 1 capsule (20 mg total) by mouth daily.    Authorizing Provider: Rise Mu    Ordering User: Janan Ridge

## 2017-06-30 ENCOUNTER — Telehealth: Payer: Self-pay | Admitting: Family Medicine

## 2017-06-30 NOTE — Telephone Encounter (Signed)
Patient needs refill on Ramipril BP medication , but needs appointment to establish with new provider can transfer care to Dr. Caryl Bis or DR. Olivia Mackie Mclean-Scocuzza refill until appointment.

## 2017-07-03 ENCOUNTER — Other Ambulatory Visit: Payer: Self-pay | Admitting: Family Medicine

## 2017-07-12 ENCOUNTER — Encounter: Payer: Self-pay | Admitting: Cardiovascular Disease

## 2017-07-18 ENCOUNTER — Telehealth: Payer: Self-pay | Admitting: Cardiovascular Disease

## 2017-07-18 ENCOUNTER — Other Ambulatory Visit: Payer: Self-pay

## 2017-07-18 MED ORDER — FLUTICASONE PROPIONATE 50 MCG/ACT NA SUSP: NASAL | 3 refills | Status: DC

## 2017-07-18 NOTE — Telephone Encounter (Signed)
Pt c/o of Chest Pain: STAT if CP now or developed within 24 hours  1. Are you having CP right now? Yes  2. Are you experiencing any other symptoms (ex. SOB, nausea, vomiting, sweating)? SOB   3. How long have you been experiencing CP? Last 3 days, tight and uncomforable  4. Is your CP continuous or coming and going? continuous   5. Have you taken Nitroglycerin? Yes, yesterday and it did not seem to help ?

## 2017-07-18 NOTE — Telephone Encounter (Signed)
Spoke with patient and he states that he has been hurting in his chest now for a few days. He states that on Friday and Saturday he did lift a couple of heavy toilets and the pain started hurting after that. Reviewed that he could try some ibuprofen or acetomenipine and ice to that area to see if that will help with his symptoms. He verbalized understanding of our conversation, agreement with plan, and had no further questions at this time.

## 2017-07-19 DIAGNOSIS — F331 Major depressive disorder, recurrent, moderate: Secondary | ICD-10-CM | POA: Diagnosis not present

## 2017-08-23 DIAGNOSIS — C44612 Basal cell carcinoma of skin of right upper limb, including shoulder: Secondary | ICD-10-CM | POA: Diagnosis not present

## 2017-09-19 ENCOUNTER — Encounter: Payer: Self-pay | Admitting: Family Medicine

## 2017-09-19 ENCOUNTER — Ambulatory Visit (INDEPENDENT_AMBULATORY_CARE_PROVIDER_SITE_OTHER): Payer: Medicare Other | Admitting: Family Medicine

## 2017-09-19 ENCOUNTER — Other Ambulatory Visit: Payer: Self-pay | Admitting: Family Medicine

## 2017-09-19 ENCOUNTER — Other Ambulatory Visit: Payer: Self-pay

## 2017-09-19 VITALS — BP 136/66 | HR 61 | Temp 98.4°F | Wt 184.8 lb

## 2017-09-19 DIAGNOSIS — R0989 Other specified symptoms and signs involving the circulatory and respiratory systems: Secondary | ICD-10-CM | POA: Diagnosis not present

## 2017-09-19 DIAGNOSIS — R7303 Prediabetes: Secondary | ICD-10-CM

## 2017-09-19 DIAGNOSIS — R972 Elevated prostate specific antigen [PSA]: Secondary | ICD-10-CM | POA: Diagnosis not present

## 2017-09-19 DIAGNOSIS — I10 Essential (primary) hypertension: Secondary | ICD-10-CM | POA: Diagnosis not present

## 2017-09-19 DIAGNOSIS — E785 Hyperlipidemia, unspecified: Secondary | ICD-10-CM

## 2017-09-19 DIAGNOSIS — M79672 Pain in left foot: Secondary | ICD-10-CM | POA: Diagnosis not present

## 2017-09-19 LAB — BASIC METABOLIC PANEL
BUN: 24 mg/dL — ABNORMAL HIGH (ref 6–23)
CO2: 29 mEq/L (ref 19–32)
Calcium: 9 mg/dL (ref 8.4–10.5)
Chloride: 107 mEq/L (ref 96–112)
Creatinine, Ser: 1.4 mg/dL (ref 0.40–1.50)
GFR: 51.62 mL/min — ABNORMAL LOW (ref >60.00)
Glucose, Bld: 107 mg/dL — ABNORMAL HIGH (ref 70–99)
Potassium: 4.3 mEq/L (ref 3.5–5.1)
Sodium: 141 mEq/L (ref 135–145)

## 2017-09-19 LAB — HEMOGLOBIN A1C: Hgb A1c MFr Bld: 5.9 % (ref 4.6–6.5)

## 2017-09-19 NOTE — Assessment & Plan Note (Signed)
At goal.  Continue current regimen.  Check BMP.

## 2017-09-19 NOTE — Assessment & Plan Note (Signed)
Patient notes intermittent myalgias.  He has had these chronically with statin therapy.  Will try every other day dosing to see if that is beneficial.  He will return in 1 month for LDL check.  Could consider Repatha if intolerant of statins.

## 2017-09-19 NOTE — Assessment & Plan Note (Signed)
Recheck A1c today 

## 2017-09-19 NOTE — Patient Instructions (Signed)
Nice to see you. We will get you referred to a podiatrist. We will check some lab work today. Please take your Crestor either Monday Wednesday Friday or every other day.  We will have you return in about a month to ensure that your cholesterol is running good with this regimen.

## 2017-09-19 NOTE — Progress Notes (Signed)
Tommi Rumps, MD Phone: [phone]  Rodney Delacruz is a 84 y.o. male who presents today for follow-up.  Hypertension: Typically 140 over 6s.  Taking ramipril metoprolol.  No chest pain, shortness of breath, or edema.  Hyperlipidemia: Taking Crestor.  No right upper quadrant pain.  Patient notes intermittent myalgias with all statins.  Notes he does take his statin daily and he will take it for a period of time and then have to discontinue it for a period of time due to muscle aches.  Follows with urology for elevated PSA.  Reports no urinary complaints.  Reports he is due to see them soon.  Patient notes left dorsal foot discomfort that has been going on for several months now.  Notes it will come out of nowhere.  Feels like a cramp at times.  No injury.  No prior imaging.  Has tried Advil.  He is interested in seeing a podiatrist.  Social History   Tobacco Use  Smoking Status Former Smoker  . Packs/day: 1.00  . Years: 20.00  . Pack years: 20.00  . Types: Cigarettes  . Last attempt to quit: 08/15/1976  . Years since quitting: 41.1  Smokeless Tobacco Never Used     ROS see history of present illness  Objective  Physical Exam Vitals:   09/19/17 1447  BP: 136/66  Pulse: 61  Temp: 98.4 F (36.9 C)  SpO2: 97%    BP Readings from Last 3 Encounters:  09/19/17 136/66  04/24/17 (!) 142/70  01/30/17 (!) 132/58   Wt Readings from Last 3 Encounters:  09/19/17 184 lb 12.8 oz (83.8 kg)  04/24/17 187 lb (84.8 kg)  01/30/17 186 lb 4 oz (84.5 kg)    Physical Exam  Constitutional: No distress.  Cardiovascular: Normal rate, regular rhythm and normal heart sounds.  2+ left DP pulse, decreased left PT and right DP and PT pulses  Pulmonary/Chest: Effort normal and breath sounds normal.  Musculoskeletal: He exhibits no edema.  No tenderness of the dorsum or plantar surface of his left foot, transverse arch appears flattened bilateral feet, hammertoes noted in left second  through fifth toes, several hammertoes in the right foot as well  Neurological: He is alert. Gait normal.  Skin: Skin is warm and dry. He is not diaphoretic.     Assessment/Plan: Please see individual problem list.  Essential hypertension At goal.  Continue current regimen.  Check BMP.  Hyperlipidemia Patient notes intermittent myalgias.  He has had these chronically with statin therapy.  Will try every other day dosing to see if that is beneficial.  He will return in 1 month for LDL check.  Could consider Repatha if intolerant of statins.  Elevated PSA He continues to follow with urology.  Decreased pedal pulses Noted on exam.  We will get ABIs.  Left foot pain Suspect related to transverse arch collapse and hammertoes.  Will refer to podiatry for evaluation.  Prediabetes Recheck A1c today.   Orders Placed This Encounter  Procedures  . Basic Metabolic Panel (BMET)  . HgB A1c  . LDL cholesterol, direct    Standing Status:   Future    Standing Expiration Date:   09/19/2018  . Ambulatory referral to Podiatry    Referral Priority:   Routine    Referral Type:   Consultation    Referral Reason:   Specialty Services Required    Requested Specialty:   Podiatry    Number of Visits Requested:   1    No  orders of the defined types were placed in this encounter.    Tommi Rumps, MD [place]

## 2017-09-19 NOTE — Assessment & Plan Note (Signed)
Noted on exam.  We will get ABIs.

## 2017-09-19 NOTE — Assessment & Plan Note (Signed)
He continues to follow with urology.

## 2017-09-19 NOTE — Assessment & Plan Note (Signed)
Suspect related to transverse arch collapse and hammertoes.  Will refer to podiatry for evaluation.

## 2017-10-05 ENCOUNTER — Ambulatory Visit (INDEPENDENT_AMBULATORY_CARE_PROVIDER_SITE_OTHER): Payer: Medicare Other

## 2017-10-05 DIAGNOSIS — R0989 Other specified symptoms and signs involving the circulatory and respiratory systems: Secondary | ICD-10-CM

## 2017-10-09 ENCOUNTER — Encounter: Payer: Self-pay | Admitting: Podiatry

## 2017-10-09 ENCOUNTER — Ambulatory Visit: Payer: Medicare Other

## 2017-10-09 ENCOUNTER — Ambulatory Visit (INDEPENDENT_AMBULATORY_CARE_PROVIDER_SITE_OTHER): Payer: Medicare Other | Admitting: Podiatry

## 2017-10-09 VITALS — BP 177/80 | HR 63

## 2017-10-09 DIAGNOSIS — M216X9 Other acquired deformities of unspecified foot: Secondary | ICD-10-CM

## 2017-10-09 DIAGNOSIS — M2042 Other hammer toe(s) (acquired), left foot: Secondary | ICD-10-CM | POA: Diagnosis not present

## 2017-10-09 DIAGNOSIS — M2041 Other hammer toe(s) (acquired), right foot: Secondary | ICD-10-CM | POA: Diagnosis not present

## 2017-10-09 DIAGNOSIS — M779 Enthesopathy, unspecified: Secondary | ICD-10-CM | POA: Diagnosis not present

## 2017-10-09 NOTE — Progress Notes (Signed)
   Subjective:    Patient ID: Rodney Delacruz, male    DOB: Dec 15, 1933, 84 y.o.   MRN: [id number]  HPIthis patient presents the office with chief complaint of pain noted on the top of his big toe joint on both feet.   He says the pain has been present for approximately 6 months.  He says he was referred to this office by her medical doctor.  Patient states that his left foot is worse than his right foot.  He says there is a numbness and a cramping sensation noted on the top of the big toe joint both feet.  He denies any history of trauma or injury to the foot.  He has provided no self treatment. He was referred to this office by his medical doctor.  He presents the office today for an evaluation and treatment of his painful feet.    Review of Systems  HENT: Positive for sinus pressure and tinnitus.   Hematological: Bruises/bleeds easily.       Objective:   Physical Exam General Appearance  Alert, conversant and in no acute stress.  Vascular  Dorsalis pedis and posterior pulses are palpable  bilaterally.  Capillary return is within normal limits  bilaterally. Temperature is within normal limits  Bilaterally.  Neurologic  Senn-Weinstein monofilament wire test within normal limits  bilaterally. Muscle power within normal limits bilaterally.  Nails Normal nails noted with no evidence of fungal or bacterial infection.  Orthopedic  No limitations of motion of motion feet bilaterally.  No crepitus or effusions noted.  Severe cavus foot type  B/L.  Hammer toes 1-5  B/L.  Palpable pain dorsal aspect first metatarsal left foot.  Skin  normotropic skin with no porokeratosis noted bilaterally.  No signs of infections or ulcers noted.          Assessment & Plan:  Capsilitis  1st MPJ  B/L secondary cavus foot type.  IE  Xraus reveal bony reactivity on dorsum of first metatarsal  B/L. Calcification arteries  B/L.  Patient has developed  a capsulitis the big toe joint due to cavus  foot type.  Patient  states the pain is not significant to treat with medicine.  I recommended that he be evaluated by Liliane Channel for possible soft insoles with a metatarsal pad.   Gardiner Barefoot DPM

## 2017-10-11 ENCOUNTER — Ambulatory Visit (INDEPENDENT_AMBULATORY_CARE_PROVIDER_SITE_OTHER): Payer: Medicare Other | Admitting: Orthotics

## 2017-10-11 DIAGNOSIS — M722 Plantar fascial fibromatosis: Secondary | ICD-10-CM

## 2017-10-11 DIAGNOSIS — M2042 Other hammer toe(s) (acquired), left foot: Secondary | ICD-10-CM

## 2017-10-11 DIAGNOSIS — M216X9 Other acquired deformities of unspecified foot: Secondary | ICD-10-CM

## 2017-10-11 DIAGNOSIS — M2041 Other hammer toe(s) (acquired), right foot: Secondary | ICD-10-CM

## 2017-10-11 NOTE — Progress Notes (Signed)
Patient came into today to be cast for Custom Foot Orthotics. Upon recommendation of Dr. Prudence Davidson Patient presents with severe pes cavus, b/l FHL.  Goals are arch support, forefoot cushioning  W/ reverse mortons and met pad Plan vendorRichy  Self pay $300

## 2017-10-18 ENCOUNTER — Other Ambulatory Visit (INDEPENDENT_AMBULATORY_CARE_PROVIDER_SITE_OTHER): Payer: Medicare Other

## 2017-10-18 DIAGNOSIS — E785 Hyperlipidemia, unspecified: Secondary | ICD-10-CM

## 2017-10-18 LAB — LDL CHOLESTEROL, DIRECT: Direct LDL: 78 mg/dL

## 2017-11-01 ENCOUNTER — Ambulatory Visit: Payer: Medicare Other | Admitting: Orthotics

## 2017-11-01 DIAGNOSIS — M216X9 Other acquired deformities of unspecified foot: Secondary | ICD-10-CM

## 2017-11-01 DIAGNOSIS — M2042 Other hammer toe(s) (acquired), left foot: Principal | ICD-10-CM

## 2017-11-01 DIAGNOSIS — M2041 Other hammer toe(s) (acquired), right foot: Secondary | ICD-10-CM

## 2017-11-01 NOTE — Progress Notes (Signed)
Patient came in today to pick up custom made foot orthotics.  The goals were accomplished and the patient reported no dissatisfaction with said orthotics.  Patient was advised of breakin period and how to report any issues. 

## 2017-11-08 DIAGNOSIS — L570 Actinic keratosis: Secondary | ICD-10-CM | POA: Diagnosis not present

## 2017-12-07 ENCOUNTER — Telehealth: Payer: Self-pay | Admitting: Cardiovascular Disease

## 2017-12-07 NOTE — Progress Notes (Signed)
In Cardiology Office Note  Date:  12/08/2017   ID:  Rodney Delacruz, DOB 1933-10-12, MRN [id number]  PCP:  [name], MD   Chief Complaint  Patient presents with  . Other    Patient reports 4-5 out of 10 chest discomfort/tightness and nausea for two days. States bilateral lower arms feel "different", describing as aching pains and pain in middle of back. Meds reviewed verbally with patient.     HPI:  84 year old male with  coronary artery disease, prior PCI and bypass surgery in 2008 at [facility],  presenting to [facility] for chest pain on 10/30/2012.  ruled out for MI and had a Myoview study that showed no ischemia.  GERD.  prior smoking history, stopped in the 70s,  no diabetes He presents today for routine followup of his coronary artery disease  In follow-up people reports that his blood pressure has been running high at home BP elevated 140 -160 Bottom to 60 to 70  Not feeling as well recently, attributes some significant leg weakness to the Lipitor Gait instability, unable to climb stairs which she attributes to the Lipitor He stopped it this past weekend Previously had problems on Lipitor and felt better without the medication  Over the weekend lifted a grandfather clock on Saturday, took some holes for his plants on Sunday on Tuesday Went fishing and was casting Wednesday developed chest pain arm pain back pain General malaise, did not feel well Concerned about cardiac etiology but felt different from previous anginal symptoms  At baseline is typically active  Reports having previous rash on Plavix, only takes aspirin  Previous lab work cholesterol was 136 on Lipitor 20 mg daily  EKG personally reviewed by myself on todays visit Shows normal sinus rhythm rate 60 bpm no significant ST or T wave changes  Other past medical history Cardiac catheterization June 2009 The shows 90% distal left main disease, 70% ostial LAD disease, 100% mid LAD at the site of a  prior stent, 100% distal LAD disease at the site of prior stent, D1 with 60% disease, proximal circumflex with 40% disease, mid circumflex with 50%, OM1 with 30% disease, proximal RCA with 30% followed by 40% followed by 60% disease, 50% distal PDA disease  PMH:   has a past medical history of Allergy, Anxiety, Coronary artery disease, Depression, Elevated PSA, Heart murmur, Hyperlipidemia, Hypertension, MI (myocardial infarction) ([place]) (2009), and Ulcer.  PSH:    Past Surgical History:  Procedure Laterality Date  . CARDIAC CATHETERIZATION  2005, 2009  . CHOLECYSTECTOMY  2001  . CORONARY ANGIOPLASTY  2005   s/p stent placement @ [facility]  . CORONARY ARTERY BYPASS GRAFT  2009   [facility]  . TONSILLECTOMY AND ADENOIDECTOMY  1942    Current Outpatient Medications  Medication Sig Dispense Refill  . aspirin 81 MG tablet Take 81 mg by mouth daily.    Marland Kitchen atorvastatin (LIPITOR) 20 MG tablet Take 40 mg by mouth daily.    Marland Kitchen buPROPion (WELLBUTRIN XL) 300 MG 24 hr tablet Take 300 mg by mouth daily.    . fluticasone (FLONASE) 50 MCG/ACT nasal spray USE 1 SPRAY IN BOTH NOSTRILS DAILY 16 g 3  . metoprolol succinate (TOPROL-XL) 25 MG 24 hr tablet TAKE 1 TABLET BY MOUTH EVERY DAY 90 tablet 3  . Multiple Vitamin (MULTI-VITAMIN DAILY PO) Take by mouth.    . nitroGLYCERIN (NITROSTAT) 0.4 MG SL tablet PLACE 1 TABLET (0.4 MG TOTAL) UNDER THE TONGUE EVERY 5 (FIVE) MINUTES AS NEEDED  FOR CHEST PAIN. 25 tablet 1  . omeprazole (PRILOSEC) 20 MG capsule Take 1 capsule (20 mg total) by mouth daily. 90 capsule 3  . ramipril (ALTACE) 5 MG capsule TAKE 1 CAPSULE BY MOUTH EVERY DAY 90 capsule 0  . sildenafil (VIAGRA) 50 MG tablet Take 1-2 tablets (50-100 mg total) by mouth daily as needed for erectile dysfunction. 10 tablet 3   No current facility-administered medications for this visit.      Allergies:   Plavix [clopidogrel bisulfate]   Social History:  The patient  reports that he quit smoking about 41 years ago. His  smoking use included cigarettes. He has a 20.00 pack-year smoking history. He has never used smokeless tobacco. He reports that he drinks alcohol. He reports that he does not use drugs.   Family History:   family history includes Alcohol abuse in his father and paternal grandfather; Diabetes in his sister; Heart attack in his brother; Heart disease in his brother; Stroke in his mother.    Review of Systems: Review of Systems  Constitutional: Negative.   Respiratory: Negative.   Cardiovascular: Positive for chest pain.       Right arm pain and back pain  Gastrointestinal: Negative.   Musculoskeletal: Negative.   Neurological: Negative.   Psychiatric/Behavioral: Negative.   All other systems reviewed and are negative.    PHYSICAL EXAM: VS:  BP (!) 170/60 (BP Location: Left Arm, Patient Position: Sitting, Cuff Size: Normal)   Pulse 60   Ht 6' (1.829 m)   Wt 184 lb (83.5 kg)   BMI 24.95 kg/m  , BMI Body mass index is 24.95 kg/m. Constitutional:  oriented to person, place, and time. No distress.  HENT:  Head: Normocephalic and atraumatic.  Eyes:  no discharge. No scleral icterus.  Neck: Normal range of motion. Neck supple. No JVD present.  Cardiovascular: Normal rate, regular rhythm, normal heart sounds and intact distal pulses. Exam reveals no gallop and no friction rub. No edema No murmur heard. Pulmonary/Chest: Effort normal and breath sounds normal. No stridor. No respiratory distress.  no wheezes.  no rales.  no tenderness.  Abdominal: Soft.  no distension.  no tenderness.  Musculoskeletal: Normal range of motion.  no  tenderness or deformity.  Neurological:  normal muscle tone. Coordination normal. No atrophy Skin: Skin is warm and dry. No rash noted. not diaphoretic.  Psychiatric:  normal mood and affect. behavior is normal. Thought content normal.    Recent Labs: 04/24/2017: ALT 13; Hemoglobin 12.6; Platelets 265.0 09/19/2017: BUN 24; Creatinine, Ser 1.40; Potassium 4.3;  Sodium 141    Lipid Panel Lab Results  Component Value Date   CHOL 136 04/24/2017   HDL 49.00 04/24/2017   LDLCALC 73 04/24/2017   TRIG 69.0 04/24/2017      Wt Readings from Last 3 Encounters:  12/08/17 184 lb (83.5 kg)  09/19/17 184 lb 12.8 oz (83.8 kg)  04/24/17 187 lb (84.8 kg)       ASSESSMENT AND PLAN:  Essential hypertension - Plan: EKG 12-Lead Blood pressure running high on today's visit and at home Recommended he increase ramipril up to 10 mg daily If blood pressure continues to run high additional medications could be added such as amlodipine  S/P CABG (coronary artery bypass graft) - Plan: EKG 12-Lead New symptoms past several days Unclear if this is musculoskeletal  having chest pain back pain right arm pain  He was very active prior to the onset of symptoms as it is Friday afternoon  we have offered Cardiac enzymes through the hospital, EKG is reassuring, normal We have offered stress testing on Monday He prefers to call us on Monday to see how his symptoms go through the weekend If he continues to have symptoms, treadmill Myoview would be ordered We did refill his nitroglycerin He will also try NSAIDs for pain through the weekend  Coronary artery disease involving native coronary artery of native heart without angina pectoris  plan as above, Will plan for stress testing if he continues to have chest pain symptoms through the weekend  Mixed hyperlipidemia Did not tolerate Lipitor or Crestor He reports not being able to tolerate any of the statins We will start Zetia 10 mg daily   Total encounter time more than 45 minutes  Greater than 50% was spent in counseling and coordination of care with the patient  He will call us back concerning his symptoms on Monday, stress test might be needed Disposition:   F/U  12 months   Orders Placed This Encounter  Procedures  . EKG 12-Lead     Signed, Esmond Plants, M.D., Ph.D. 12/08/2017  [place], [place]

## 2017-12-07 NOTE — Telephone Encounter (Signed)
Patient reports 4-5 out of 10 chest discomfort/tightness and nausea for two days. States bilateral lower arms feel "different", describing as aching pains and pain in middle of back.  He moved a heavy clock Saturday and dug holes for tomato plants on Sunday. Denies jaw pain or diaphoresis. He took one nitroglycerin x 1 yesterday with no improvement; was unaware he could take up to 3. Educated patient on how to take nitroglycerin.  BP 141/88, HR 70 yesterday; about the same today.  His pain at this time is 2 out of 10.  He did have MI/CABG in 2009 but he does not feel same symptoms now; he is more aware/conscious of any chest pain since CABG and would like to know how to proceed. Advised I would review with Dr. Rockey Situ for possible add-on appointment. He understands if symptoms worsen, he should call 911. Pt agreeable with plan.

## 2017-12-07 NOTE — Telephone Encounter (Signed)
Called patient. He says he took 3 nitros and feels better. Pt verbalized understanding to call 911 or go to the emergency room, if he develops any new or worsening symptoms. He agreed to come in tomorrow at 11:20 to see Dr Rockey Situ.

## 2017-12-07 NOTE — Telephone Encounter (Signed)
Hard to say what symptoms are secondary to Could be from musculoskeletal etiology given his activities  If discomfort is more musculoskeletal sounding, worse with movement, palpation of his muscles and chest, he could try NSAIDs  If severe would go to the emergency room If he would like to be evaluated I could see him tomorrow Would create a spot, 11:20

## 2017-12-07 NOTE — Telephone Encounter (Signed)
Pt calling stating for the last two days he's has some chest discomfort  He states he's not sure if this is indigestion or if its something else   Pt c/o of Chest Pain: STAT if CP now or developed within 24 hours  1. Are you having CP right now? States it is more of Discomfort   2. Are you experiencing any other symptoms (ex. SOB, nausea, vomiting, sweating)? No   3. How long have you been experiencing CP? For about two day   4. Is your CP continuous or coming and going? Come and goes  5. Have you taken Nitroglycerin? No  ?

## 2017-12-08 ENCOUNTER — Ambulatory Visit (INDEPENDENT_AMBULATORY_CARE_PROVIDER_SITE_OTHER): Payer: Medicare Other | Admitting: Cardiovascular Disease

## 2017-12-08 ENCOUNTER — Encounter: Payer: Self-pay | Admitting: Cardiovascular Disease

## 2017-12-08 VITALS — BP 170/60 | HR 60 | Ht 72.0 in | Wt 184.0 lb

## 2017-12-08 DIAGNOSIS — Z951 Presence of aortocoronary bypass graft: Secondary | ICD-10-CM | POA: Diagnosis not present

## 2017-12-08 DIAGNOSIS — I10 Essential (primary) hypertension: Secondary | ICD-10-CM

## 2017-12-08 DIAGNOSIS — I25118 Atherosclerotic heart disease of native coronary artery with other forms of angina pectoris: Secondary | ICD-10-CM

## 2017-12-08 DIAGNOSIS — E782 Mixed hyperlipidemia: Secondary | ICD-10-CM

## 2017-12-08 MED ORDER — NITROGLYCERIN 0.4 MG SL SUBL: 0.4000 mg | SUBLINGUAL_TABLET | SUBLINGUAL | 1 refills | Status: DC | PRN

## 2017-12-08 MED ORDER — RAMIPRIL 10 MG PO CAPS: 10.0000 mg | ORAL_CAPSULE | Freq: Every day | ORAL | 3 refills | Status: DC

## 2017-12-08 MED ORDER — EZETIMIBE 10 MG PO TABS: 10.0000 mg | ORAL_TABLET | Freq: Every day | ORAL | 11 refills | Status: DC

## 2017-12-08 NOTE — Patient Instructions (Addendum)
Medication Instructions:  Your physician has recommended you make the following change in your medication:  1. INCREASE Ramipril 10 mg once daily for blood pressure 2. START Zetia once a day for cholesterol.   Labwork:  We will order a troponin in the lobby of the hospital GO to [facility] Entrance and check in at the front desk for registration.  Testing/Procedures:  No further testing at this time  Call on Monday if you would like a nuclear stress test [place]  Your caregiver has ordered a Stress Test with nuclear imaging. The purpose of this test is to evaluate the blood supply to your heart muscle. This procedure is referred to as a "Non-Invasive Stress Test." This is because other than having an IV started in your vein, nothing is inserted or "invades" your body. Cardiac stress tests are done to find areas of poor blood flow to the heart by determining the extent of coronary artery disease (CAD). Some patients exercise on a treadmill, which naturally increases the blood flow to your heart, while others who are  unable to walk on a treadmill due to physical limitations have a pharmacologic/chemical stress agent called Lexiscan . This medicine will mimic walking on a treadmill by temporarily increasing your coronary blood flow.   Please note: these test may take anywhere between 2-4 hours to complete  PLEASE REPORT TO [place] AT THE FIRST DESK WILL DIRECT YOU WHERE TO GO  Date of Procedure:_____________________________________  Arrival Time for Procedure:______________________________  Instructions regarding medication:   _X___:  Hold metoprolol the night before procedure and morning of procedure    PLEASE NOTIFY THE OFFICE AT LEAST 24 HOURS IN ADVANCE IF YOU ARE UNABLE TO KEEP YOUR APPOINTMENT.  [phone] AND  PLEASE NOTIFY NUCLEAR MEDICINE AT [facility] AT LEAST 24 HOURS IN ADVANCE IF YOU ARE UNABLE TO KEEP YOUR APPOINTMENT.  [phone]  How to prepare for your Myoview test:  1. Do not eat or drink after midnight 2. No caffeine for 24 hours prior to test 3. No smoking 24 hours prior to test. 4. Your medication may be taken with water.  If your doctor stopped a medication because of this test, do not take that medication. 5. Ladies, please do not wear dresses.  Skirts or pants are appropriate. Please wear a short sleeve shirt. 6. No perfume, cologne or lotion. 7. Wear comfortable walking shoes. No heels!      Follow-Up: It was a pleasure seeing you in the office today. Please call us if you have new issues that need to be addressed before your next appt.  [phone]  Your physician wants you to follow-up in: 12 months.  You will receive a reminder letter in the mail two months in advance. If you don't receive a letter, please call our office to schedule the follow-up appointment.  If you need a refill on your cardiac medications before your next appointment, please call your pharmacy.  For educational health videos Log in to : [email or web address] Or : [email or web address], password : [email or web address]

## 2017-12-16 ENCOUNTER — Other Ambulatory Visit: Payer: Self-pay | Admitting: Cardiovascular Disease

## 2017-12-19 ENCOUNTER — Other Ambulatory Visit: Payer: Self-pay | Admitting: Family Medicine

## 2017-12-21 DIAGNOSIS — H353131 Nonexudative age-related macular degeneration, bilateral, early dry stage: Secondary | ICD-10-CM | POA: Diagnosis not present

## 2017-12-21 DIAGNOSIS — H26492 Other secondary cataract, left eye: Secondary | ICD-10-CM | POA: Diagnosis not present

## 2018-02-20 DIAGNOSIS — H26492 Other secondary cataract, left eye: Secondary | ICD-10-CM | POA: Diagnosis not present

## 2018-03-14 ENCOUNTER — Other Ambulatory Visit: Payer: Self-pay | Admitting: Family Medicine

## 2018-03-15 ENCOUNTER — Other Ambulatory Visit: Payer: Self-pay

## 2018-03-19 ENCOUNTER — Encounter: Payer: Self-pay | Admitting: Family Medicine

## 2018-03-19 ENCOUNTER — Ambulatory Visit (INDEPENDENT_AMBULATORY_CARE_PROVIDER_SITE_OTHER): Payer: Medicare Other | Admitting: Family Medicine

## 2018-03-19 VITALS — BP 136/78 | HR 63 | Temp 98.1°F | Ht 72.0 in | Wt 184.0 lb

## 2018-03-19 DIAGNOSIS — K219 Gastro-esophageal reflux disease without esophagitis: Secondary | ICD-10-CM | POA: Diagnosis not present

## 2018-03-19 DIAGNOSIS — E782 Mixed hyperlipidemia: Secondary | ICD-10-CM | POA: Diagnosis not present

## 2018-03-19 DIAGNOSIS — R0989 Other specified symptoms and signs involving the circulatory and respiratory systems: Secondary | ICD-10-CM

## 2018-03-19 DIAGNOSIS — I10 Essential (primary) hypertension: Secondary | ICD-10-CM | POA: Diagnosis not present

## 2018-03-19 DIAGNOSIS — I25118 Atherosclerotic heart disease of native coronary artery with other forms of angina pectoris: Secondary | ICD-10-CM | POA: Diagnosis not present

## 2018-03-19 LAB — BASIC METABOLIC PANEL
BUN: 15 mg/dL (ref 6–23)
CO2: 28 mEq/L (ref 19–32)
Calcium: 9.3 mg/dL (ref 8.4–10.5)
Chloride: 105 mEq/L (ref 96–112)
Creatinine, Ser: 1.2 mg/dL (ref 0.40–1.50)
GFR: 61.59 mL/min (ref >60.00)
Glucose, Bld: 99 mg/dL (ref 70–99)
Potassium: 4.7 mEq/L (ref 3.5–5.1)
Sodium: 139 mEq/L (ref 135–145)

## 2018-03-19 NOTE — Patient Instructions (Signed)
Nice to see you. We will get lab work today and contact you with the results. We will have you see the vascular surgeon as well.

## 2018-03-19 NOTE — Assessment & Plan Note (Signed)
Continue Zetia. °

## 2018-03-19 NOTE — Assessment & Plan Note (Signed)
Asymptomatic.  Given his history of areas of stenosis in esophagus we will continue this medication.

## 2018-03-19 NOTE — Assessment & Plan Note (Signed)
Adequately controlled.  Continue current medication.  Check BMP. 

## 2018-03-19 NOTE — Progress Notes (Signed)
  Tommi Rumps, MD Phone: [phone]  Rodney Delacruz is a 84 y.o. male who presents today for f/u.  CC: htn, hld, gerd  HYPERTENSION  Disease Monitoring  Home BP Monitoring 140/60-70s Chest pain- no    Dyspnea- no Medications  Compliance-  Taking metoprolol, ramipril.  Edema- no  HYPERLIPIDEMIA Symptoms Chest pain on exertion:  no   Leg claudication:   no Medications: Compliance- taking zetia Right upper quadrant pain- no  Muscle aches- no  GERD:   Reflux symptoms: no   Abd pain: no   Blood in stool: no  Dysphagia: no   EGD: 8-10 years ago - history of stenosis in esophagus Medication: taking omeprazole Notes he has a hernia in epigastric region. On exam this appears to be diastasis recti.    Social History   Tobacco Use  Smoking Status Former Smoker  . Packs/day: 1.00  . Years: 20.00  . Pack years: 20.00  . Types: Cigarettes  . Last attempt to quit: 08/15/1976  . Years since quitting: 41.6  Smokeless Tobacco Never Used     ROS see history of present illness  Objective  Physical Exam Vitals:   03/19/18 0930 03/19/18 0958  BP: (!) 150/72 136/78  Pulse: 63   Temp: 98.1 F (36.7 C)   SpO2: 97%     BP Readings from Last 3 Encounters:  03/19/18 136/78  12/08/17 (!) 170/60  10/09/17 (!) 177/80   Wt Readings from Last 3 Encounters:  03/19/18 184 lb (83.5 kg)  12/08/17 184 lb (83.5 kg)  09/19/17 184 lb 12.8 oz (83.8 kg)    Physical Exam  Constitutional: No distress.  Cardiovascular: Normal rate, regular rhythm and normal heart sounds.  Pulmonary/Chest: Effort normal and breath sounds normal.  Abdominal: Soft. Bowel sounds are normal. He exhibits no distension. There is no tenderness.  Diastasis recti noted, no abdominal hernia noted  Musculoskeletal: He exhibits no edema.  Neurological: He is alert.  Skin: Skin is warm and dry. He is not diaphoretic.     Assessment/Plan: Please see individual problem list.  Essential  hypertension Adequately controlled.  Continue current medication.  Check BMP.  Hyperlipidemia Continue Zetia.  Decreased pedal pulses Noncompressible lower extremity arteries on ABIs.  Refer to vascular surgery.  GERD (gastroesophageal reflux disease) Asymptomatic.  Given his history of areas of stenosis in esophagus we will continue this medication.   Orders Placed This Encounter  Procedures  . Basic Metabolic Panel (BMET)  . Ambulatory referral to Vascular Surgery    Referral Priority:   Routine    Referral Type:   Surgical    Referral Reason:   Specialty Services Required    Requested Specialty:   Vascular Surgery    Number of Visits Requested:   1    No orders of the defined types were placed in this encounter.    Tommi Rumps, MD [place]

## 2018-03-19 NOTE — Assessment & Plan Note (Signed)
Noncompressible lower extremity arteries on ABIs.  Refer to vascular surgery.

## 2018-04-07 ENCOUNTER — Other Ambulatory Visit: Payer: Self-pay | Admitting: Family Medicine

## 2018-05-28 ENCOUNTER — Encounter (INDEPENDENT_AMBULATORY_CARE_PROVIDER_SITE_OTHER): Payer: Self-pay | Admitting: Nurse Practitioner

## 2018-05-28 ENCOUNTER — Ambulatory Visit (INDEPENDENT_AMBULATORY_CARE_PROVIDER_SITE_OTHER): Payer: Medicare Other | Admitting: Nurse Practitioner

## 2018-05-28 VITALS — BP 150/65 | HR 64 | Resp 16 | Ht 72.0 in | Wt 184.0 lb

## 2018-05-28 DIAGNOSIS — Z87891 Personal history of nicotine dependence: Secondary | ICD-10-CM

## 2018-05-28 DIAGNOSIS — M79604 Pain in right leg: Secondary | ICD-10-CM | POA: Diagnosis not present

## 2018-05-28 DIAGNOSIS — E782 Mixed hyperlipidemia: Secondary | ICD-10-CM | POA: Diagnosis not present

## 2018-05-28 DIAGNOSIS — R0989 Other specified symptoms and signs involving the circulatory and respiratory systems: Secondary | ICD-10-CM

## 2018-05-28 DIAGNOSIS — I10 Essential (primary) hypertension: Secondary | ICD-10-CM

## 2018-05-28 DIAGNOSIS — M79605 Pain in left leg: Secondary | ICD-10-CM

## 2018-05-28 DIAGNOSIS — K219 Gastro-esophageal reflux disease without esophagitis: Secondary | ICD-10-CM | POA: Diagnosis not present

## 2018-05-28 NOTE — Progress Notes (Signed)
Subjective:    Patient ID: Rodney Delacruz, male    DOB: Feb 24, 1934, 84 y.o.   MRN: [id number] Chief Complaint  Patient presents with  . New Patient (Initial Visit)    Decreased pedal pulses    HPI  Rodney Delacruz is a 84 y.o. male is sent for evaluation for decreased pedal pulses by Dr. Lisbeth Ply.  The patient is seen for evaluation of painful lower extremities. Patient notes the pain is variable and not always associated with activity.  The pain is somewhat consistent day to day occurring on most days.  The pain has been progressive over the past several years.  He was recently diagnosed with having hammertoes, as a cause for the pain.   He underwent a bilateral ABI done on 10/05/2017.  Which revealed bilateral noncompressible arteries.  He had triphasic flows in his left lower extremity with triphasic/biphasic in his right lower extremity.  He denies any rest pain or claudication-like symptoms.  He currently walks 1 to 1-1/2 miles daily.  The patient denies rest pain or dangling of an extremity off the side of the bed during the night for relief. No open wounds or sores at this time. No history of DVT or phlebitis. No prior interventions or surgeries.  There is a  history of back problems and DJD of the lumbar and sacral spine.    Past Medical History:  Diagnosis Date  . Allergy   . Anxiety   . Coronary artery disease   . Depression   . Elevated PSA    Followed by Dr. Rosana Berger every 6 month  . Heart murmur   . Hyperlipidemia   . Hypertension   . MI (myocardial infarction) ([place]) 2009  . Ulcer     Social History   Socioeconomic History  . Marital status: Married    Spouse name: Olin Hauser  . Number of children: 2  . Years of education: 69  . Highest education level: Not on file  Occupational History  . 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 Financial resource strain: Not on file  . Food insecurity:    Worry: Not on file   Inability: Not on file  . Transportation needs:    Medical: Not on file    Non-medical: Not on file  Tobacco Use  . Smoking status: Former Smoker    Packs/day: 1.00    Years: 20.00    Pack years: 20.00    Types: Cigarettes    Last attempt to quit: 08/15/1976    Years since quitting: 41.8  . Smokeless tobacco: Never Used  Substance and Sexual Activity  . Alcohol use: Yes    Alcohol/week: 0.0 standard drinks    Comment: Occasional  . Drug use: No  . Sexual activity: Not on file  Lifestyle  . Physical activity:    Days per week: Not on file    Minutes per session: Not on file  . Stress: Not on file  Relationships  . Social connections:    Talks on phone: Not on file    Gets together: Not on file    Attends religious service: Not on file    Active member of club or organization: Not on file    Attends meetings of clubs or organizations: Not on file    Relationship status: Not on file  . Intimate partner violence:    Fear of current or ex partner: Not on file    Emotionally  abused: Not on file    Physically abused: Not on file    Forced sexual activity: Not on file  Other Topics Concern  . Not on file  Social History Narrative   Rodney Delacruz grew up in [place], [place]. He currently lives in [place] with his wife, Olin Hauser, of 14 years. He was married previously to his high school sweetheart. They were married for 33 years. He widowed in 1993. They had two son Christia Reading and Daquane). Mr. Sleight sons live locally [facility] and [place]). He worked at [employer] and retired from [employer]. He was there for 20 years. He worked in Designer, television/film set. He enjoys fishing, reading, watching TV on his spare time. He also enjoys walking and visiting friends.    Past Surgical History:  Procedure Laterality Date  . CARDIAC CATHETERIZATION  2005, 2009  . CHOLECYSTECTOMY  2001  . CORONARY ANGIOPLASTY  2005   s/p stent placement @ [facility]  . CORONARY ARTERY BYPASS GRAFT   2009   [facility]  . TONSILLECTOMY AND ADENOIDECTOMY  1942    Family History  Problem Relation Age of Onset  . Heart attack Brother   . Heart disease Brother        CAD  . Alcohol abuse Father   . Diabetes Sister   . Stroke Mother   . Alcohol abuse Paternal Grandfather     Allergies  Allergen Reactions  . Plavix [Clopidogrel Bisulfate] Rash     Review of Systems   Review of Systems: Negative Unless Checked Constitutional: [] Weight loss  [] Fever  [] Chills Cardiac: [] Chest pain   []  Atrial Fibrillation  [] Palpitations   [] Shortness of breath when laying flat   [] Shortness of breath with exertion. Vascular:  [] Pain in legs with walking   [] Pain in legs with standing  [] History of DVT   [] Phlebitis   [] Swelling in legs   [] Varicose veins   [] Non-healing ulcers Pulmonary:   [] Uses home oxygen   [] Productive cough   [] Hemoptysis   [] Wheeze  [] COPD   [] Asthma Neurologic:  [] Dizziness   [] Seizures   [] History of stroke   [] History of TIA  [] Aphasia   [] Vissual changes   [] Weakness or numbness in arm   [] Weakness or numbness in leg Musculoskeletal:   [] Joint swelling   [] Joint pain   [] Low back pain  []  History of Knee Replacement Hematologic:  [] Easy bruising  [] Easy bleeding   [] Hypercoagulable state   [] Anemic Gastrointestinal:  [] Diarrhea   [] Vomiting  [x] Gastroesophageal reflux/heartburn   [] Difficulty swallowing. Genitourinary:  [] Chronic kidney disease   [] Difficult urination  [] Anuric   [] Blood in urine Skin:  [] Rashes   [] Ulcers  Psychological:  [] History of anxiety   []  History of major depression  []  Memory Difficulties     Objective:   Physical Exam  BP (!) 150/65 (BP Location: Right Arm, Patient Position: Sitting)   Pulse 64   Resp 16   Ht 6' (1.829 m)   Wt 184 lb (83.5 kg)   BMI 24.95 kg/m   Gen: WD/WN, NAD Head: [place]/AT, No temporalis wasting.  Ear/Nose/Throat: Hearing grossly intact, nares w/o erythema or drainage Eyes: PER, EOMI, sclera nonicteric.  Neck: Supple,  no masses.  No JVD.  Pulmonary:  Good air movement, no use of accessory muscles.  Cardiac: RRR Vascular:  Vessel Right Left  Radial Palpable Palpable  Dorsalis Pedis Palpable Palpable  Posterior Tibial Palpable Palpable   Gastrointestinal: soft, non-distended. No guarding/no peritoneal signs.  Musculoskeletal: M/S 5/5  throughout.  No deformity or atrophy.  Neurologic: Pain and light touch intact in extremities.  Symmetrical.  Speech is fluent. Motor exam as listed above. Psychiatric: Judgment intact, Mood & affect appropriate for pt's clinical situation. Dermatologic: No Venous rashes. No Ulcers Noted.  No changes consistent with cellulitis. Lymph : No Cervical lymphadenopathy, no lichenification or skin changes of chronic lymphedema.      Assessment & Plan:   1. Decreased pedal pulses Recommend:  I do not find evidence of life style limiting vascular disease. The patient specifically denies life style limitation.  Previous noninvasive studies including ABI's of the legs do not identify critical vascular problems.  The patient should continue walking and begin a more formal exercise program. The patient should continue his antiplatelet therapy and aggressive treatment of the lipid abnormalities.  The patient should begin wearing graduated compression socks 15-20 mmHg strength to control her mild edema.  Patient will follow-up with me on a PRN basis   2. Gastroesophageal reflux disease, esophagitis presence not specified Continue PPI as already ordered, this medication has been reviewed and there are no changes at this time.  Avoidence of caffeine and alcohol  Moderate elevation of the head of the bed   3. Essential hypertension Continue antihypertensive medications as already ordered, these medications have been reviewed and there are no changes at this time.   4. Mixed hyperlipidemia Continue statin as ordered and reviewed, no changes at this time    Current  Outpatient Medications on File Prior to Visit  Medication Sig Dispense Refill  . aspirin 81 MG tablet Take 81 mg by mouth daily.    Marland Kitchen buPROPion (WELLBUTRIN XL) 300 MG 24 hr tablet Take 300 mg by mouth daily.    Marland Kitchen ezetimibe (ZETIA) 10 MG tablet Take 1 tablet (10 mg total) by mouth daily. 30 tablet 11  . fluticasone (FLONASE) 50 MCG/ACT nasal spray USE 1 SPRAY IN BOTH NOSTRILS DAILY 16 g 1  . metoprolol succinate (TOPROL-XL) 25 MG 24 hr tablet TAKE 1 TABLET BY MOUTH EVERY DAY 90 tablet 3  . Multiple Vitamin (MULTI-VITAMIN DAILY PO) Take by mouth.    . nitroGLYCERIN (NITROSTAT) 0.4 MG SL tablet Place 1 tablet (0.4 mg total) under the tongue every 5 (five) minutes as needed for chest pain. 25 tablet 1  . omeprazole (PRILOSEC) 20 MG capsule Take 1 capsule (20 mg total) by mouth daily. 90 capsule 3  . ramipril (ALTACE) 10 MG capsule Take 1 capsule (10 mg total) by mouth daily. 90 capsule 3  . sildenafil (VIAGRA) 50 MG tablet Take 1-2 tablets (50-100 mg total) by mouth daily as needed for erectile dysfunction. 10 tablet 3   No current facility-administered medications on file prior to visit.     There are no Patient Instructions on file for this visit. Return if symptoms worsen or fail to improve.   Kris Hartmann, NP

## 2018-06-07 DIAGNOSIS — Z23 Encounter for immunization: Secondary | ICD-10-CM | POA: Diagnosis not present

## 2018-06-17 ENCOUNTER — Other Ambulatory Visit: Payer: Self-pay | Admitting: Physician Assistant

## 2018-06-21 DIAGNOSIS — F331 Major depressive disorder, recurrent, moderate: Secondary | ICD-10-CM | POA: Diagnosis not present

## 2018-09-19 ENCOUNTER — Ambulatory Visit (INDEPENDENT_AMBULATORY_CARE_PROVIDER_SITE_OTHER): Payer: Medicare Other | Admitting: Family Medicine

## 2018-09-19 ENCOUNTER — Encounter: Payer: Self-pay | Admitting: Family Medicine

## 2018-09-19 VITALS — BP 140/70 | HR 59 | Temp 98.3°F | Ht 72.0 in | Wt 184.6 lb

## 2018-09-19 DIAGNOSIS — E782 Mixed hyperlipidemia: Secondary | ICD-10-CM | POA: Diagnosis not present

## 2018-09-19 DIAGNOSIS — N529 Male erectile dysfunction, unspecified: Secondary | ICD-10-CM | POA: Diagnosis not present

## 2018-09-19 DIAGNOSIS — R7303 Prediabetes: Secondary | ICD-10-CM

## 2018-09-19 DIAGNOSIS — I10 Essential (primary) hypertension: Secondary | ICD-10-CM | POA: Diagnosis not present

## 2018-09-19 LAB — LIPID PANEL
Cholesterol: 154 mg/dL (ref 0–200)
HDL: 47.1 mg/dL (ref >39.00)
LDL Cholesterol: 94 mg/dL (ref 0–99)
NonHDL: 106.68
Total CHOL/HDL Ratio: 3
Triglycerides: 64 mg/dL (ref 0.0–149.0)
VLDL: 12.8 mg/dL (ref 0.0–40.0)

## 2018-09-19 LAB — COMPREHENSIVE METABOLIC PANEL
ALT: 11 U/L (ref 0–53)
AST: 13 U/L (ref 0–37)
Albumin: 3.9 g/dL (ref 3.5–5.2)
Alkaline Phosphatase: 66 U/L (ref 39–117)
BUN: 19 mg/dL (ref 6–23)
CO2: 24 mEq/L (ref 19–32)
Calcium: 8.9 mg/dL (ref 8.4–10.5)
Chloride: 107 mEq/L (ref 96–112)
Creatinine, Ser: 1.18 mg/dL (ref 0.40–1.50)
GFR: 59.01 mL/min — ABNORMAL LOW (ref >60.00)
Glucose, Bld: 86 mg/dL (ref 70–99)
Potassium: 4.6 mEq/L (ref 3.5–5.1)
Sodium: 142 mEq/L (ref 135–145)
Total Bilirubin: 0.5 mg/dL (ref 0.2–1.2)
Total Protein: 6.3 g/dL (ref 6.0–8.3)

## 2018-09-19 LAB — HEMOGLOBIN A1C: Hgb A1c MFr Bld: 5.7 % (ref 4.6–6.5)

## 2018-09-19 MED ORDER — SILDENAFIL CITRATE 50 MG PO TABS: 50.0000 mg | ORAL_TABLET | Freq: Every day | ORAL | 0 refills | Status: DC | PRN

## 2018-09-19 MED ORDER — TETANUS-DIPHTH-ACELL PERTUSSIS 5-2.5-18.5 LF-MCG/0.5 IM SUSP: 0.5000 mL | Freq: Once | INTRAMUSCULAR | 0 refills | Status: AC

## 2018-09-19 NOTE — Assessment & Plan Note (Signed)
Continue diet and exercise.  Check A1c. 

## 2018-09-19 NOTE — Patient Instructions (Signed)
Nice to see you. Please get your tetanus vaccine at the pharmacy. We will get lab work today and contact you with the results.

## 2018-09-19 NOTE — Progress Notes (Signed)
Tommi Rumps, MD Phone: [phone]  Rodney Delacruz is a 85 y.o. male who presents today for f/u.   CC: Erectile dysfunction, hypertension, prediabetes  Erectile dysfunction: Patient has been taking Viagra intermittently.  He notes this works very well.  He is not able to get an erection without this.  He notes no chest pain with sexual activity.  He knows not to take nitroglycerin if he has taken the Viagra.  Hypertension: Typically running 121-160/58-91.  He is unsure if his machine is accurate.  He is taking ramipril and metoprolol.  He notes no chest pain.  He notes chronic exertional dyspnea that is only occurring when he is doing extreme exertion.  This resolves with rest.  This has been going on for at least 5 years.  He notes it has not worsened or changed.  No edema. he has not required any nitroglycerin.  Prediabetes: No polyuria or polydipsia.  He walks 1 mile 4 days a week.  Diet is described as normal  Social History   Tobacco Use  Smoking Status Former Smoker  . Packs/day: 1.00  . Years: 20.00  . Pack years: 20.00  . Types: Cigarettes  . Last attempt to quit: 08/15/1976  . Years since quitting: 42.1  Smokeless Tobacco Never Used     ROS see history of present illness  Objective  Physical Exam Vitals:   09/19/18 0933  BP: 140/70  Pulse: (!) 59  Temp: 98.3 F (36.8 C)  SpO2: 97%    BP Readings from Last 3 Encounters:  09/19/18 140/70  05/28/18 (!) 150/65  03/19/18 136/78   Wt Readings from Last 3 Encounters:  09/19/18 184 lb 9.6 oz (83.7 kg)  05/28/18 184 lb (83.5 kg)  03/19/18 184 lb (83.5 kg)    Physical Exam Constitutional:      General: He is not in acute distress.    Appearance: He is not diaphoretic.  Cardiovascular:     Rate and Rhythm: Normal rate and regular rhythm.     Heart sounds: Normal heart sounds.  Pulmonary:     Effort: Pulmonary effort is normal.     Breath sounds: Normal breath sounds.  Musculoskeletal:     Right lower  leg: No edema.     Left lower leg: No edema.  Skin:    General: Skin is warm and dry.  Neurological:     Mental Status: He is alert.      Assessment/Plan: Please see individual problem list.  Essential hypertension Up and down at home.  He will return in 1 week for nurse BP check and he will bring his monitor.  If his monitor is accurate then we will need to alter his regimen.  He will continue his current regimen at this time.  Prediabetes Continue diet and exercise.  Check A1c.  Hyperlipidemia Check lipid panel.  Erectile dysfunction Continue with Viagra.  Discussed that if he were to take the Viagra he could not take nitroglycerin and he would need to inform EMS or the emergency department that he had taken Viagra if he were to need those services.   Health Maintenance: He will get his tetanus vaccine at the pharmacy.  Orders Placed This Encounter  Procedures  . Lipid panel  . Comp Met (CMET)  . HgB A1c    Meds ordered this encounter  Medications  . Tdap (BOOSTRIX) 5-2.5-18.5 LF-MCG/0.5 injection    Sig: Inject 0.5 mLs into the muscle once for 1 dose.    Dispense:  0.5 mL    Refill:  0  . sildenafil (VIAGRA) 50 MG tablet    Sig: Take 1-2 tablets (50-100 mg total) by mouth daily as needed for erectile dysfunction.    Dispense:  10 tablet    Refill:  0     Tommi Rumps, MD [place]

## 2018-09-19 NOTE — Assessment & Plan Note (Signed)
Continue with Viagra.  Discussed that if he were to take the Viagra he could not take nitroglycerin and he would need to inform EMS or the emergency department that he had taken Viagra if he were to need those services.

## 2018-09-19 NOTE — Assessment & Plan Note (Signed)
Check lipid panel  

## 2018-09-19 NOTE — Assessment & Plan Note (Signed)
Up and down at home.  He will return in 1 week for nurse BP check and he will bring his monitor.  If his monitor is accurate then we will need to alter his regimen.  He will continue his current regimen at this time.

## 2018-09-27 ENCOUNTER — Encounter: Payer: Self-pay | Admitting: *Deleted

## 2018-10-03 ENCOUNTER — Encounter: Payer: Self-pay | Admitting: *Deleted

## 2018-10-03 ENCOUNTER — Ambulatory Visit (INDEPENDENT_AMBULATORY_CARE_PROVIDER_SITE_OTHER): Payer: Medicare Other | Admitting: *Deleted

## 2018-10-03 VITALS — BP 134/62 | HR 62 | Resp 18

## 2018-10-03 DIAGNOSIS — I10 Essential (primary) hypertension: Secondary | ICD-10-CM

## 2018-10-03 MED ORDER — FLUTICASONE PROPIONATE 50 MCG/ACT NA SUSP: 2.0000 | Freq: Every day | NASAL | 2 refills | Status: DC

## 2018-10-03 NOTE — Progress Notes (Signed)
Patient here for nurse visit BP check per order from ! Week BP check  Patient reports compliance with prescribed BP medications: yes  Last dose of BP medication: & amm  BP Readings from Last 3 Encounters:  10/03/18 134/62  09/19/18 140/70  05/28/18 (!) 150/65   Pulse Readings from Last 3 Encounters:  10/03/18 62  09/19/18 (!) 59  05/28/18 64    Patient brought home BP monitor in, and reading was with parameters of same reading as nurse 136/67. Copy of home reading given to PCP.  Patient verbalized understanding of instructions.   Kerin Salen, LPN

## 2018-10-04 NOTE — Progress Notes (Signed)
Blood pressure is overall acceptable.  No changes to be made.  If his blood pressure trends up he should let us know.

## 2018-10-04 NOTE — Progress Notes (Signed)
Mychart message sent to patient.

## 2018-11-13 ENCOUNTER — Telehealth: Payer: Self-pay

## 2018-11-13 NOTE — Telephone Encounter (Signed)
Virtual Visit Pre-Appointment Phone Call  Steps For Call:  1. Confirm consent - "In the setting of the current Covid19 crisis, you are scheduled for a TELEPHONE visit with your provider on 11/16/2018 at 11:00am.  Just as we do with many in-office visits, in order for you to participate in this visit, we must obtain consent.  If you'd like, I can send this to your mychart (if signed up) or email for you to review.  Otherwise, I can obtain your verbal consent now.  All virtual visits are billed to your insurance company just like a normal visit would be.  By agreeing to a virtual visit, we'd like you to understand that the technology does not allow for your provider to perform an examination, and thus may limit your provider's ability to fully assess your condition.  Finally, though the technology is pretty good, we cannot assure that it will always work on either your or our end, and in the setting of a video visit, we may have to convert it to a phone-only visit.  In either situation, we cannot ensure that we have a secure connection.  Are you willing to proceed?"  2. Give patient instructions for WebEx download to smartphone as below if video visit  3. Advise patient to be prepared with any vital sign or heart rhythm information, their current medicines, and a piece of paper and pen handy for any instructions they may receive the day of their visit  4. Inform patient they will receive a phone call 15 minutes prior to their appointment time (may be from unknown caller ID) so they should be prepared to answer  5. Confirm that appointment type is correct in Epic appointment notes (video vs telephone)    TELEPHONE CALL NOTE  Rodney Delacruz has been deemed a candidate for a follow-up tele-health visit to limit community exposure during the Covid-19 pandemic. I spoke with the patient via phone to ensure availability of phone/video source, confirm preferred email & phone number, and discuss  instructions and expectations.  I reminded Rodney Delacruz to be prepared with any vital sign and/or heart rhythm information that could potentially be obtained via home monitoring, at the time of his visit. I reminded Rodney Delacruz to expect a phone call at the time of his visit if his visit.  Did the patient verbally acknowledge consent to treatment? YES  Janan Ridge, [place] 11/13/2018 3:41 PM  CONSENT FOR TELE-HEALTH VISIT - PLEASE REVIEW  I hereby voluntarily request, consent and authorize [facility] and its employed or contracted physicians, physician assistants, nurse practitioners or other licensed health care professionals (the Practitioner), to provide me with telemedicine health care services (the Services") as deemed necessary by the treating Practitioner. I acknowledge and consent to receive the Services by the Practitioner via telemedicine. I understand that the telemedicine visit will involve communicating with the Practitioner through live audiovisual communication technology and the disclosure of certain medical information by electronic transmission. I acknowledge that I have been given the opportunity to request an in-person assessment or other available alternative prior to the telemedicine visit and am voluntarily participating in the telemedicine visit.  I understand that I have the right to withhold or withdraw my consent to the use of telemedicine in the course of my care at any time, without affecting my right to future care or treatment, and that the Practitioner or I may terminate the telemedicine visit at any time. I understand that I have  the right to inspect all information obtained and/or recorded in the course of the telemedicine visit and may receive copies of available information for a reasonable fee.  I understand that some of the potential risks of receiving the Services via telemedicine include:   Delay or interruption in medical evaluation due to  technological equipment failure or disruption;  Information transmitted may not be sufficient (e.g. poor resolution of images) to allow for appropriate medical decision making by the Practitioner; and/or   In rare instances, security protocols could fail, causing a breach of personal health information.  Furthermore, I acknowledge that it is my responsibility to provide information about my medical history, conditions and care that is complete and accurate to the best of my ability. I acknowledge that Practitioner's advice, recommendations, and/or decision may be based on factors not within their control, such as incomplete or inaccurate data provided by me or distortions of diagnostic images or specimens that may result from electronic transmissions. I understand that the practice of medicine is not an exact science and that Practitioner makes no warranties or guarantees regarding treatment outcomes. I acknowledge that I will receive a copy of this consent concurrently upon execution via email to the email address I last provided but may also request a printed copy by calling the office of [place].    I understand that my insurance will be billed for this visit.   I have read or had this consent read to me.  I understand the contents of this consent, which adequately explains the benefits and risks of the Services being provided via telemedicine.   I have been provided ample opportunity to ask questions regarding this consent and the Services and have had my questions answered to my satisfaction.  I give my informed consent for the services to be provided through the use of telemedicine in my medical care  By participating in this telemedicine visit I agree to the above.

## 2018-11-16 ENCOUNTER — Other Ambulatory Visit: Payer: Self-pay

## 2018-11-16 ENCOUNTER — Telehealth (INDEPENDENT_AMBULATORY_CARE_PROVIDER_SITE_OTHER): Payer: Medicare Other | Admitting: Cardiovascular Disease

## 2018-11-16 DIAGNOSIS — I10 Essential (primary) hypertension: Secondary | ICD-10-CM | POA: Diagnosis not present

## 2018-11-16 DIAGNOSIS — Z951 Presence of aortocoronary bypass graft: Secondary | ICD-10-CM | POA: Diagnosis not present

## 2018-11-16 DIAGNOSIS — E782 Mixed hyperlipidemia: Secondary | ICD-10-CM | POA: Diagnosis not present

## 2018-11-16 DIAGNOSIS — I25118 Atherosclerotic heart disease of native coronary artery with other forms of angina pectoris: Secondary | ICD-10-CM

## 2018-11-16 MED ORDER — EVOLOCUMAB 140 MG/ML ~~LOC~~ SOAJ: 1.0000 "pen " | SUBCUTANEOUS | 11 refills | Status: DC

## 2018-11-16 MED ORDER — NITROGLYCERIN 0.4 MG SL SUBL: 0.4000 mg | SUBLINGUAL_TABLET | SUBLINGUAL | 2 refills | Status: DC | PRN

## 2018-11-16 NOTE — Progress Notes (Addendum)
Virtual Visit via Telephone Note   This visit type was conducted due to national recommendations for restrictions regarding the COVID-19 Pandemic (e.g. social distancing) in an effort to limit this patient's exposure and mitigate transmission in our community.  Due to her co-morbid illnesses, this patient is at least at moderate risk for complications without adequate follow up.  This format is felt to be most appropriate for this patient at this time.  The patient did not have access to video technology/had technical difficulties with video requiring transitioning to audio format only (telephone).  All issues noted in this document were discussed and addressed.  No physical exam could be performed with this format.  Please refer to the patient's chart for her  consent to telehealth for [facility].    Date:  11/16/2018   ID:  Rodney Delacruz, DOB Dec 29, 1933, MRN [id number]  Patient Location:  [address] [place] [address]   Provider location:   [facility], [place] office  PCP:  [name], MD  Cardiologist:  Arvid Right [facility]  Chief Complaint:  SOB, chest tighthness    History of Present Illness:    Rodney Delacruz is a 85 y.o. male who presents via audio/video conferencing for a telehealth visit today.   The patient does not symptoms concerning for COVID-19 infection (fever, chills, cough, or new SHORTNESS OF BREATH).   Patient has a past medical history of  coronary artery disease, prior PCI and bypass surgery in 2008 at [facility],  presenting to [facility] for chest pain on 10/30/2012.  ruled out for MI and had a Myoview study that showed no ischemia.  GERD.  prior smoking history, stopped in the 70s,  no diabetes He presents today for routine followup of his coronary artery disease  blood pressure  elevated 174 up to 081 systolic Blood pressure mildly elevated on a consistent manner Does not really want to change his medications  On last  clinic visit Reported having leg weakness which he felt was from Lipitor Gait instability, unable to climb stairs He had stopped the Lipitor Previously had stopped Lipitor for similar symptoms Tried Crestor in the past, similar symptoms Still with leg weakness even without a statin Does not want to retry a statin  Sometimes some chest tightness , little bit of pain if he overdoes it Uses NTG Spar[place], 3 times a year  SOB digging a hole Could have just been overdoing it Does not do any regular exercise  Total chol 154 LDL 94 HBA1C 5.7  Today's numbers BP 448-185 systolic Pulse 62   Prior CV studies:   The following studies were reviewed today:  Last cardiac catheterization 2009   Stress test February 2014   Past Medical History:  Diagnosis Date  . Allergy   . Anxiety   . Coronary artery disease   . Depression   . Elevated PSA    Followed by Dr. Rosana Berger every 6 month  . Heart murmur   . Hyperlipidemia   . Hypertension   . MI (myocardial infarction) ([place]) 2009  . Ulcer    Past Surgical History:  Procedure Laterality Date  . CARDIAC CATHETERIZATION  2005, 2009  . CHOLECYSTECTOMY  2001  . CORONARY ANGIOPLASTY  2005   s/p stent placement @ [facility]  . CORONARY ARTERY BYPASS GRAFT  2009   [facility]  . TONSILLECTOMY AND ADENOIDECTOMY  1942     Current Meds  Medication Sig  . aspirin 81 MG tablet  Take 81 mg by mouth daily.  Marland Kitchen buPROPion (WELLBUTRIN XL) 300 MG 24 hr tablet Take 300 mg by mouth daily.  Marland Kitchen ezetimibe (ZETIA) 10 MG tablet Take 1 tablet (10 mg total) by mouth daily.  . fluticasone (FLONASE) 50 MCG/ACT nasal spray Place 2 sprays into both nostrils daily.  . metoprolol succinate (TOPROL-XL) 25 MG 24 hr tablet TAKE 1 TABLET BY MOUTH EVERY DAY  . Multiple Vitamin (MULTI-VITAMIN DAILY PO) Take by mouth.  . nitroGLYCERIN (NITROSTAT) 0.4 MG SL tablet Place 1 tablet (0.4 mg total) under the tongue every 5 (five) minutes as needed for chest pain.  Marland Kitchen omeprazole  (PRILOSEC) 20 MG capsule TAKE 1 CAPSULE BY MOUTH EVERY DAY  . ramipril (ALTACE) 10 MG capsule Take 1 capsule (10 mg total) by mouth daily.  . sildenafil (VIAGRA) 50 MG tablet Take 1-2 tablets (50-100 mg total) by mouth daily as needed for erectile dysfunction.     Allergies:   Plavix [clopidogrel bisulfate]   Social History   Tobacco Use  . Smoking status: Former Smoker    Packs/day: 1.00    Years: 20.00    Pack years: 20.00    Types: Cigarettes    Last attempt to quit: 08/15/1976    Years since quitting: 42.2  . Smokeless tobacco: Never Used  Substance Use Topics  . Alcohol use: Yes    Alcohol/week: 0.0 standard drinks    Comment: Occasional  . Drug use: No     Current Outpatient Medications on File Prior to Visit  Medication Sig Dispense Refill  . aspirin 81 MG tablet Take 81 mg by mouth daily.    Marland Kitchen buPROPion (WELLBUTRIN XL) 300 MG 24 hr tablet Take 300 mg by mouth daily.    Marland Kitchen ezetimibe (ZETIA) 10 MG tablet Take 1 tablet (10 mg total) by mouth daily. 30 tablet 11  . fluticasone (FLONASE) 50 MCG/ACT nasal spray Place 2 sprays into both nostrils daily. 16 g 2  . metoprolol succinate (TOPROL-XL) 25 MG 24 hr tablet TAKE 1 TABLET BY MOUTH EVERY DAY 90 tablet 3  . Multiple Vitamin (MULTI-VITAMIN DAILY PO) Take by mouth.    . nitroGLYCERIN (NITROSTAT) 0.4 MG SL tablet Place 1 tablet (0.4 mg total) under the tongue every 5 (five) minutes as needed for chest pain. 25 tablet 1  . omeprazole (PRILOSEC) 20 MG capsule TAKE 1 CAPSULE BY MOUTH EVERY DAY 90 capsule 2  . ramipril (ALTACE) 10 MG capsule Take 1 capsule (10 mg total) by mouth daily. 90 capsule 3  . sildenafil (VIAGRA) 50 MG tablet Take 1-2 tablets (50-100 mg total) by mouth daily as needed for erectile dysfunction. 10 tablet 0   No current facility-administered medications on file prior to visit.      Family Hx: The patient's family history includes Alcohol abuse in his father and paternal grandfather; Diabetes in his sister;  Heart attack in his brother; Heart disease in his brother; Stroke in his mother.  ROS:   Please see the history of present illness.    Review of Systems  Constitutional: Negative.   Respiratory: Positive for shortness of breath.   Cardiovascular: Positive for chest pain.  Gastrointestinal: Negative.   Musculoskeletal: Negative.        Leg weakness  Neurological: Negative.   Psychiatric/Behavioral: Negative.   All other systems reviewed and are negative.     Labs/Other Tests and Data Reviewed:    Recent Labs: 09/19/2018: ALT 11; BUN 19; Creatinine, Ser 1.18; Potassium 4.6; Sodium 142  Recent Lipid Panel Lab Results  Component Value Date/Time   CHOL 154 09/19/2018 10:04 AM   CHOL 148 10/01/2012 01:05 PM   TRIG 64.0 09/19/2018 10:04 AM   TRIG 89 10/01/2012 01:05 PM   HDL 47.10 09/19/2018 10:04 AM   HDL 49 10/01/2012 01:05 PM   CHOLHDL 3 09/19/2018 10:04 AM   LDLCALC 94 09/19/2018 10:04 AM   LDLCALC 81 10/01/2012 01:05 PM   LDLDIRECT 78.0 10/18/2017 07:59 AM    Wt Readings from Last 3 Encounters:  09/19/18 184 lb 9.6 oz (83.7 kg)  05/28/18 184 lb (83.5 kg)  03/19/18 184 lb (83.5 kg)     Exam:    Vital Signs: Vital signs as detailed above in HPI  Well nourished, well developed male in no acute distress. Constitutional:  oriented to person, place, and time. No distress.     ASSESSMENT & PLAN:    Atherosclerosis of native coronary artery of native heart with stable angina pectoris (HCC) Some nonspecific symptoms, chest pain and shortness of breath with exertion We have offered stress testing, he has declined at this time He will call us if symptoms gets worse  Essential hypertension Long discussion concerning his systolic numbers which are running high He does not want to change his medications at this time We did suggest he take extra ramipril as needed for systolic pressures in the 150s  Mixed hyperlipidemia LDL 94 on Zetia alone Difficulty tolerating  statins secondary to leg weakness Tried Lipitor and Crestor in the past with symptoms We will place a request for him to start Repatha  S/P CABG (coronary artery bypass graft) Plan as above, if he has worsening anginal symptoms we will order a stress test Suggested he call the office if he would like this done    COVID-19 Education: The signs and symptoms of COVID-19 were discussed with the patient and how to seek care for testing (follow up with PCP or arrange E-visit).  The importance of social distancing was discussed today.  Patient Risk:   After full review of this patients clinical status, I feel that they are at least moderate risk at this time.  Time:   Today, I have spent 25 minutes with the patient with telehealth technology discussing anginal symptoms, his symptoms of shortness of breath and chest discomfort, potential need for stress testing, discussed management of cholesterol, new medication options other than statins.     Medication Adjustments/Labs and Tests Ordered: Current medicines are reviewed at length with the patient today.  Concerns regarding medicines are outlined above.   Tests Ordered: No tests ordered  He will call us if he would like a stress test  Medication Changes: Order placed to start Repatha   Disposition: Follow-up in 6 months   Signed, Ida Rogue, MD  11/16/2018 11:15 AM    [place] Office [address], [place],  [address]

## 2018-11-16 NOTE — Patient Instructions (Addendum)
Medication Instructions:  We will send in request for repatha 140mg /mL injection every 14 days. Statin intolerance, known CAD, CABG  We will refill the NTG sl  If you need a refill on your cardiac medications before your next appointment, please call your pharmacy.   Lab work: No new labs needed   If you have labs (blood work) drawn today and your tests are completely normal, you will receive your results only by: Marland Kitchen MyChart Message (if you have MyChart) OR . A paper copy in the mail If you have any lab test that is abnormal or we need to change your treatment, we will call you to review the results.   Testing/Procedures: No new testing needed  Follow-Up: At [facility], you and your health needs are our priority.  As part of our continuing mission to provide you with exceptional heart care, we have created designated Provider Care Teams.  These Care Teams include your primary Cardiologist (physician) and Advanced Practice Providers (APPs -  Physician Assistants and Nurse Practitioners) who all work together to provide you with the care you need, when you need it.  . You will need a follow up appointment in 6 months .   Please call our office 2 months in advance to schedule this appointment.    . Providers on your designated Care Team:   . Murray Hodgkins, NP . Christell Faith, PA-C . Marrianne Mood, PA-C  Any Other Special Instructions Will Be Listed Below (If Applicable).  For educational health videos Log in to : [email or web address] Or : [email or web address], password : [email or web address]

## 2018-12-10 ENCOUNTER — Ambulatory Visit: Payer: Self-pay | Admitting: Cardiovascular Disease

## 2018-12-14 ENCOUNTER — Other Ambulatory Visit: Payer: Self-pay | Admitting: *Deleted

## 2018-12-14 MED ORDER — RAMIPRIL 10 MG PO CAPS: 10.0000 mg | ORAL_CAPSULE | Freq: Every day | ORAL | 3 refills | Status: DC

## 2018-12-17 ENCOUNTER — Other Ambulatory Visit: Payer: Self-pay

## 2018-12-17 MED ORDER — METOPROLOL SUCCINATE ER 25 MG PO TB24: 25.0000 mg | ORAL_TABLET | Freq: Every day | ORAL | 3 refills | Status: DC

## 2019-03-07 ENCOUNTER — Other Ambulatory Visit: Payer: Self-pay

## 2019-03-07 ENCOUNTER — Other Ambulatory Visit: Payer: Self-pay | Admitting: Family Medicine

## 2019-03-07 MED ORDER — EZETIMIBE 10 MG PO TABS: 10.0000 mg | ORAL_TABLET | Freq: Every day | ORAL | 11 refills | Status: DC

## 2019-03-07 NOTE — Telephone Encounter (Signed)
Requested Prescriptions   Signed Prescriptions Disp Refills  . ezetimibe (ZETIA) 10 MG tablet 30 tablet 11    Sig: Take 1 tablet (10 mg total) by mouth daily.    Authorizing Provider: Minna Merritts    Ordering User: Janan Ridge

## 2019-03-20 ENCOUNTER — Other Ambulatory Visit: Payer: Self-pay

## 2019-03-20 ENCOUNTER — Ambulatory Visit (INDEPENDENT_AMBULATORY_CARE_PROVIDER_SITE_OTHER): Payer: Medicare Other | Admitting: Family Medicine

## 2019-03-20 ENCOUNTER — Encounter: Payer: Self-pay | Admitting: Family Medicine

## 2019-03-20 DIAGNOSIS — I10 Essential (primary) hypertension: Secondary | ICD-10-CM | POA: Diagnosis not present

## 2019-03-20 DIAGNOSIS — E782 Mixed hyperlipidemia: Secondary | ICD-10-CM

## 2019-03-20 DIAGNOSIS — I2510 Atherosclerotic heart disease of native coronary artery without angina pectoris: Secondary | ICD-10-CM | POA: Diagnosis not present

## 2019-03-20 DIAGNOSIS — R7303 Prediabetes: Secondary | ICD-10-CM

## 2019-03-20 DIAGNOSIS — F419 Anxiety disorder, unspecified: Secondary | ICD-10-CM | POA: Diagnosis not present

## 2019-03-20 NOTE — Assessment & Plan Note (Signed)
Asymptomatic.  He will continue to follow with psychiatry.

## 2019-03-20 NOTE — Assessment & Plan Note (Signed)
Asymptomatic.  Continue current regimen.  He will come in for lab work.

## 2019-03-20 NOTE — Progress Notes (Signed)
Virtual Visit via telephone Note  This visit type was conducted due to national recommendations for restrictions regarding the COVID-19 pandemic (e.g. social distancing).  This format is felt to be most appropriate for this patient at this time.  All issues noted in this document were discussed and addressed.  No physical exam was performed (except for noted visual exam findings with Video Visits).   I connected with Rodney Delacruz today at 10:00 AM EDT by telephone and verified that I am speaking with the correct person using two identifiers. Location patient: home Location provider: work Persons participating in the virtual visit: patient, provider  I discussed the limitations, risks, security and privacy concerns of performing an evaluation and management service by telephone and the availability of in person appointments. I also discussed with the patient that there may be a patient responsible charge related to this service. The patient expressed understanding and agreed to proceed.  Interactive audio and video telecommunications were attempted between this provider and patient, however failed, due to patient having technical difficulties OR patient did not have access to video capability.  We continued and completed visit with audio only.  Reason for visit: follow-up  HPI: CAD: Reports he is doing well.  He denies chest pain or shortness of breath.  No edema.  Blood pressures are typically in the 130s or better over 60s to 70s.  Taking metoprolol and ramipril.  Hyperlipidemia: He notes he could not afford the Repatha.  He is taking Zetia.  No right upper quadrant pain, myalgias, or claudication.  Prediabetes: No polyuria or polydipsia.  He has been eating takeout fairly frequently with the COVID-19 pandemic.  He is walking 35 minutes 4 to 5 days a week.  Anxiety: Patient is on Wellbutrin for this and he follows with a psychiatrist in [place].  He notes no anxiety symptoms currently.   Wellbutrin has been beneficial.  No depression.   ROS: See pertinent positives and negatives per HPI.  Past Medical History:  Diagnosis Date  . Allergy   . Anxiety   . Coronary artery disease   . Depression   . Elevated PSA    Followed by Dr. Rosana Berger every 6 month  . Heart murmur   . Hyperlipidemia   . Hypertension   . MI (myocardial infarction) ([place]) 2009  . Ulcer     Past Surgical History:  Procedure Laterality Date  . CARDIAC CATHETERIZATION  2005, 2009  . CHOLECYSTECTOMY  2001  . CORONARY ANGIOPLASTY  2005   s/p stent placement @ [facility]  . CORONARY ARTERY BYPASS GRAFT  2009   [facility]  . TONSILLECTOMY AND ADENOIDECTOMY  1942    Family History  Problem Relation Age of Onset  . Heart attack Brother   . Heart disease Brother        CAD  . Alcohol abuse Father   . Diabetes Sister   . Stroke Mother   . Alcohol abuse Paternal Grandfather     SOCIAL HX: Former smoker.   Current Outpatient Medications:  .  aspirin 81 MG tablet, Take 81 mg by mouth daily., Disp: , Rfl:  .  buPROPion (WELLBUTRIN XL) 300 MG 24 hr tablet, Take 300 mg by mouth daily., Disp: , Rfl:  .  ezetimibe (ZETIA) 10 MG tablet, Take 1 tablet (10 mg total) by mouth daily., Disp: 30 tablet, Rfl: 11 .  fluticasone (FLONASE) 50 MCG/ACT nasal spray, SPRAY 2 SPRAYS INTO EACH NOSTRIL EVERY DAY, Disp: 48 mL, Rfl: 0 .  metoprolol succinate (TOPROL-XL) 25 MG 24 hr tablet, Take 1 tablet (25 mg total) by mouth daily., Disp: 90 tablet, Rfl: 3 .  Multiple Vitamin (MULTI-VITAMIN DAILY PO), Take by mouth., Disp: , Rfl:  .  nitroGLYCERIN (NITROSTAT) 0.4 MG SL tablet, Place 1 tablet (0.4 mg total) under the tongue every 5 (five) minutes as needed for chest pain. Maximum of 3 doses., Disp: 25 tablet, Rfl: 2 .  omeprazole (PRILOSEC) 20 MG capsule, TAKE 1 CAPSULE BY MOUTH EVERY DAY, Disp: 90 capsule, Rfl: 2 .  ramipril (ALTACE) 10 MG capsule, Take 1 capsule (10 mg total) by mouth daily., Disp: 90 capsule, Rfl: 3 .  sildenafil  (VIAGRA) 50 MG tablet, Take 1-2 tablets (50-100 mg total) by mouth daily as needed for erectile dysfunction., Disp: 10 tablet, Rfl: 0  EXAM: This was a telehealth telephone visit notes no physical exam was completed.  ASSESSMENT AND PLAN:  Discussed the following assessment and plan:  CAD (coronary artery disease) Asymptomatic.  Continue current regimen.  He will come in for lab work.  Essential hypertension Well-controlled.  Continue current regimen.  Mixed hyperlipidemia Check direct LDL.  Continue Zetia.  Prediabetes Continue diet and exercise.  Check A1c.  Anxiety Asymptomatic.  He will continue to follow with psychiatry.   Social distancing precautions and sick precautions given regarding COVID-19.   I discussed the assessment and treatment plan with the patient. The patient was provided an opportunity to ask questions and all were answered. The patient agreed with the plan and demonstrated an understanding of the instructions.   The patient was advised to call back or seek an in-person evaluation if the symptoms worsen or if the condition fails to improve as anticipated.  I provided 10 minutes of non-face-to-face time during this encounter.   Tommi Rumps, MD

## 2019-03-20 NOTE — Assessment & Plan Note (Signed)
Continue diet and exercise.  Check A1c. 

## 2019-03-20 NOTE — Assessment & Plan Note (Signed)
Check direct LDL.  Continue Zetia.

## 2019-03-20 NOTE — Assessment & Plan Note (Signed)
Well-controlled.  Continue current regimen. 

## 2019-04-15 DIAGNOSIS — L570 Actinic keratosis: Secondary | ICD-10-CM | POA: Diagnosis not present

## 2019-04-15 DIAGNOSIS — C44519 Basal cell carcinoma of skin of other part of trunk: Secondary | ICD-10-CM | POA: Diagnosis not present

## 2019-04-15 DIAGNOSIS — L988 Other specified disorders of the skin and subcutaneous tissue: Secondary | ICD-10-CM | POA: Diagnosis not present

## 2019-04-23 ENCOUNTER — Other Ambulatory Visit: Payer: Self-pay

## 2019-04-23 ENCOUNTER — Other Ambulatory Visit (INDEPENDENT_AMBULATORY_CARE_PROVIDER_SITE_OTHER): Payer: Medicare Other

## 2019-04-23 DIAGNOSIS — E782 Mixed hyperlipidemia: Secondary | ICD-10-CM | POA: Diagnosis not present

## 2019-04-23 DIAGNOSIS — R7303 Prediabetes: Secondary | ICD-10-CM

## 2019-04-23 LAB — COMPREHENSIVE METABOLIC PANEL
ALT: 15 U/L (ref 0–53)
AST: 16 U/L (ref 0–37)
Albumin: 3.9 g/dL (ref 3.5–5.2)
Alkaline Phosphatase: 76 U/L (ref 39–117)
BUN: 19 mg/dL (ref 6–23)
CO2: 29 mEq/L (ref 19–32)
Calcium: 8.9 mg/dL (ref 8.4–10.5)
Chloride: 105 mEq/L (ref 96–112)
Creatinine, Ser: 1.31 mg/dL (ref 0.40–1.50)
GFR: 52.23 mL/min — ABNORMAL LOW (ref >60.00)
Glucose, Bld: 97 mg/dL (ref 70–99)
Potassium: 4.6 mEq/L (ref 3.5–5.1)
Sodium: 140 mEq/L (ref 135–145)
Total Bilirubin: 0.4 mg/dL (ref 0.2–1.2)
Total Protein: 6.6 g/dL (ref 6.0–8.3)

## 2019-04-23 LAB — HEMOGLOBIN A1C: Hgb A1c MFr Bld: 6 % (ref 4.6–6.5)

## 2019-04-23 LAB — LDL CHOLESTEROL, DIRECT: Direct LDL: 124 mg/dL

## 2019-04-24 DIAGNOSIS — C44519 Basal cell carcinoma of skin of other part of trunk: Secondary | ICD-10-CM | POA: Diagnosis not present

## 2019-05-01 ENCOUNTER — Other Ambulatory Visit: Payer: Self-pay

## 2019-05-01 MED ORDER — OMEPRAZOLE 20 MG PO CPDR: DELAYED_RELEASE_CAPSULE | ORAL | 3 refills | Status: DC

## 2019-05-02 ENCOUNTER — Other Ambulatory Visit: Payer: Self-pay | Admitting: Family Medicine

## 2019-05-02 DIAGNOSIS — N179 Acute kidney failure, unspecified: Secondary | ICD-10-CM

## 2019-05-13 DIAGNOSIS — Z23 Encounter for immunization: Secondary | ICD-10-CM | POA: Diagnosis not present

## 2019-05-14 ENCOUNTER — Other Ambulatory Visit: Payer: Self-pay

## 2019-05-14 ENCOUNTER — Other Ambulatory Visit (INDEPENDENT_AMBULATORY_CARE_PROVIDER_SITE_OTHER): Payer: Medicare Other

## 2019-05-14 DIAGNOSIS — N179 Acute kidney failure, unspecified: Secondary | ICD-10-CM | POA: Diagnosis not present

## 2019-05-14 LAB — BASIC METABOLIC PANEL
BUN: 20 mg/dL (ref 6–23)
CO2: 28 mEq/L (ref 19–32)
Calcium: 9.1 mg/dL (ref 8.4–10.5)
Chloride: 104 mEq/L (ref 96–112)
Creatinine, Ser: 1.43 mg/dL (ref 0.40–1.50)
GFR: 47.2 mL/min — ABNORMAL LOW (ref >60.00)
Glucose, Bld: 95 mg/dL (ref 70–99)
Potassium: 5 mEq/L (ref 3.5–5.1)
Sodium: 138 mEq/L (ref 135–145)

## 2019-05-27 ENCOUNTER — Other Ambulatory Visit: Payer: Self-pay | Admitting: Cardiovascular Disease

## 2019-06-06 ENCOUNTER — Other Ambulatory Visit: Payer: Self-pay | Admitting: Family Medicine

## 2019-06-25 NOTE — Progress Notes (Signed)
Date:  06/26/2019   ID:  Rodney Delacruz, DOB [birth year], MRN [id number]  Patient Location:  [address] [address]   Provider location:   [facility], [place] office  PCP:  [name], MD  Cardiologist:  [facility]  Chief Complaint  Patient presents with  . Other    6 month follow up. Patient c/o Elevated HR one night but thinks he forgot to take his medication that day. Meds reviewed verbally with patient.      History of Present Illness:    Patient has a past medical history of coronary artery disease, prior PCI and bypass surgery in 2008 at [facility],  presenting to [facility] for chest pain on 10/30/2012.  ruled out for MI and had a Myoview study that showed no ischemia.  GERD.  prior smoking history, stopped in the 70s,  no diabetes He presents today for routine followup of his coronary artery disease  In follow-up today reports doing well in general Reports having one episode of tachycardia lasting 30 minutes Woke him form sleep, 1 to 2 Am Unclear if it was irregular No prior episodes, no episodes since then  [facility] 1 mile,  four times a week Leg sometimes gets tired  Home BP 134/70, typically well controlled  Total chol 154 LDL 94 HBA1C 5.7  Prior leg weakness which he felt was from Lipitor Gait instability, unable to climb stairs He had stopped the Lipitor Tried Crestor in the past, similar symptoms Does not want to retry a statin  EKG personally reviewed by myself on todays visit Shows normal sinus rhythm rate 67 bpm unable to exclude old inferior MI   Prior CV studies:   The following studies were reviewed today:  Last cardiac catheterization 2009  Stress test February 2014   Past Medical History:  Diagnosis Date  . Allergy   . Anxiety   . Coronary artery disease   . Depression   . Elevated PSA    Followed by Dr. Rosana Berger every 6 month  . Heart murmur   . Hyperlipidemia   . Hypertension   .  MI (myocardial infarction) ([place]) 2009  . Ulcer    Past Surgical History:  Procedure Laterality Date  . CARDIAC CATHETERIZATION  2005, 2009  . CHOLECYSTECTOMY  2001  . CORONARY ANGIOPLASTY  2005   s/p stent placement @ [facility]  . CORONARY ARTERY BYPASS GRAFT  2009   [facility]  . TONSILLECTOMY AND ADENOIDECTOMY  1942     Current Meds  Medication Sig  . aspirin 81 MG tablet Take 81 mg by mouth daily.  Marland Kitchen buPROPion (WELLBUTRIN XL) 300 MG 24 hr tablet Take 300 mg by mouth daily.  Marland Kitchen ezetimibe (ZETIA) 10 MG tablet Take 1 tablet (10 mg total) by mouth daily.  . fluticasone (FLONASE) 50 MCG/ACT nasal spray SPRAY 2 SPRAYS INTO EACH NOSTRIL EVERY DAY  . metoprolol succinate (TOPROL-XL) 25 MG 24 hr tablet Take 1 tablet (25 mg total) by mouth daily.  . Multiple Vitamin (MULTI-VITAMIN DAILY PO) Take by mouth.  . nitroGLYCERIN (NITROSTAT) 0.4 MG SL tablet PLACE 1 TABLET UNDER THE TONGUE EVERY 5 MINUTES AS NEEDED FOR CHEST PAIN. MAXIMUM OF 3 DOSES.  Marland Kitchen omeprazole (PRILOSEC) 20 MG capsule TAKE 1 CAPSULE BY MOUTH EVERY DAY  . ramipril (ALTACE) 10 MG capsule Take 1 capsule (10 mg total) by mouth daily.  . sildenafil (VIAGRA) 50 MG tablet Take 1-2 tablets (50-100 mg total) by mouth  daily as needed for erectile dysfunction.     Allergies:   Plavix [clopidogrel bisulfate]   Social History   Tobacco Use  . Smoking status: Former Smoker    Packs/day: 1.00    Years: 20.00    Pack years: 20.00    Types: Cigarettes    Quit date: 08/15/1976    Years since quitting: 42.8  . Smokeless tobacco: Never Used  Substance Use Topics  . Alcohol use: Yes    Alcohol/week: 0.0 standard drinks    Comment: Occasional  . Drug use: No     Current Outpatient Medications on File Prior to Visit  Medication Sig Dispense Refill  . aspirin 81 MG tablet Take 81 mg by mouth daily.    Marland Kitchen buPROPion (WELLBUTRIN XL) 300 MG 24 hr tablet Take 300 mg by mouth daily.    Marland Kitchen ezetimibe (ZETIA) 10 MG tablet Take 1 tablet (10 mg total) by  mouth daily. 30 tablet 11  . fluticasone (FLONASE) 50 MCG/ACT nasal spray SPRAY 2 SPRAYS INTO EACH NOSTRIL EVERY DAY 48 mL 0  . metoprolol succinate (TOPROL-XL) 25 MG 24 hr tablet Take 1 tablet (25 mg total) by mouth daily. 90 tablet 3  . Multiple Vitamin (MULTI-VITAMIN DAILY PO) Take by mouth.    . nitroGLYCERIN (NITROSTAT) 0.4 MG SL tablet PLACE 1 TABLET UNDER THE TONGUE EVERY 5 MINUTES AS NEEDED FOR CHEST PAIN. MAXIMUM OF 3 DOSES. 25 tablet 0  . omeprazole (PRILOSEC) 20 MG capsule TAKE 1 CAPSULE BY MOUTH EVERY DAY 90 capsule 3  . ramipril (ALTACE) 10 MG capsule Take 1 capsule (10 mg total) by mouth daily. 90 capsule 3  . sildenafil (VIAGRA) 50 MG tablet Take 1-2 tablets (50-100 mg total) by mouth daily as needed for erectile dysfunction. 10 tablet 0   No current facility-administered medications on file prior to visit.      Family Hx: The patient's family history includes Alcohol abuse in his father and paternal grandfather; Diabetes in his sister; Heart attack in his brother; Heart disease in his brother; Stroke in his mother.  ROS:   Please see the history of present illness.    Review of Systems  Constitutional: Negative.   Respiratory: Positive for shortness of breath.   Cardiovascular: Positive for chest pain.  Gastrointestinal: Negative.   Musculoskeletal: Negative.        Leg weakness  Neurological: Negative.   Psychiatric/Behavioral: Negative.   All other systems reviewed and are negative.    Labs/Other Tests and Data Reviewed:    Recent Labs: 04/23/2019: ALT 15 05/14/2019: BUN 20; Creatinine, Ser 1.43; Potassium 5.0; Sodium 138   Recent Lipid Panel Lab Results  Component Value Date/Time   CHOL 154 09/19/2018 10:04 AM   CHOL 148 10/01/2012 01:05 PM   TRIG 64.0 09/19/2018 10:04 AM   TRIG 89 10/01/2012 01:05 PM   HDL 47.10 09/19/2018 10:04 AM   HDL 49 10/01/2012 01:05 PM   CHOLHDL 3 09/19/2018 10:04 AM   LDLCALC 94 09/19/2018 10:04 AM   LDLCALC 81 10/01/2012 01:05  PM   LDLDIRECT 124.0 04/23/2019 08:28 AM    Wt Readings from Last 3 Encounters:  06/26/19 180 lb (81.6 kg)  03/20/19 184 lb (83.5 kg)  09/19/18 184 lb 9.6 oz (83.7 kg)     Exam:    BP (!) 146/60 (BP Location: Left Arm, Patient Position: Sitting, Cuff Size: Normal)   Pulse 67   Ht 6' (1.829 m)   Wt 180 lb (81.6 kg)  BMI 24.41 kg/m  Constitutional:  oriented to person, place, and time. No distress.  HENT:  Head: Grossly normal Eyes:  no discharge. No scleral icterus.  Neck: No JVD, no carotid bruits  Cardiovascular: Regular rate and rhythm, no murmurs appreciated Pulmonary/Chest: Clear to auscultation bilaterally, no wheezes or rails Abdominal: Soft.  no distension.  no tenderness.  Musculoskeletal: Normal range of motion Neurological:  normal muscle tone. Coordination normal. 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 No further workup at this time. Continue current medication regimen.  Essential hypertension Blood pressure is well controlled on today's visit. No changes made to the medications.  Mixed hyperlipidemia LDL 120s Difficulty tolerating statins secondary to leg weakness/myalgias Tried Lipitor and Crestor in the past with symptoms We will place a request for him to start Repatha  S/P CABG (coronary artery bypass graft) No further testing at this time    Total encounter time more than 25 minutes  Greater than 50% was spent in counseling and coordination of care with the patient   Disposition: Follow-up in 6 months   Signed, Ida Rogue, MD  06/26/2019 1:21 PM    [facility] [address] [place] [address], [place], [place] [address]

## 2019-06-26 ENCOUNTER — Ambulatory Visit (INDEPENDENT_AMBULATORY_CARE_PROVIDER_SITE_OTHER): Payer: Medicare Other | Admitting: Cardiovascular Disease

## 2019-06-26 ENCOUNTER — Other Ambulatory Visit: Payer: Self-pay

## 2019-06-26 ENCOUNTER — Encounter: Payer: Self-pay | Admitting: Cardiovascular Disease

## 2019-06-26 ENCOUNTER — Other Ambulatory Visit: Payer: Self-pay | Admitting: Cardiovascular Disease

## 2019-06-26 VITALS — BP 146/60 | HR 67 | Ht 72.0 in | Wt 180.0 lb

## 2019-06-26 DIAGNOSIS — I2510 Atherosclerotic heart disease of native coronary artery without angina pectoris: Secondary | ICD-10-CM

## 2019-06-26 DIAGNOSIS — I25118 Atherosclerotic heart disease of native coronary artery with other forms of angina pectoris: Secondary | ICD-10-CM | POA: Diagnosis not present

## 2019-06-26 MED ORDER — REPATHA SURECLICK 140 MG/ML ~~LOC~~ SOAJ: 140.0000 mg | SUBCUTANEOUS | 11 refills | Status: DC

## 2019-06-26 NOTE — Patient Instructions (Addendum)
For back, [place]: [phone]   Repetha ,  For CAD, CABG LDL 124,  Tried crestor/lipitor, had myalgias   Medication Instructions:  Your physician has recommended you make the following change in your medication:  1. START Repatha 140 mg every 14 days   If you need a refill on your cardiac medications before your next appointment, please call your pharmacy.    Lab work: No new labs needed   If you have labs (blood work) drawn today and your tests are completely normal, you will receive your results only by: Marland Kitchen MyChart Message (if you have MyChart) OR . A paper copy in the mail If you have any lab test that is abnormal or we need to change your treatment, we will call you to review the results.   Testing/Procedures: No new testing needed   Follow-Up: At [facility], you and your health needs are our priority.  As part of our continuing mission to provide you with exceptional heart care, we have created designated Provider Care Teams.  These Care Teams include your primary Cardiologist (physician) and Advanced Practice Providers (APPs -  Physician Assistants and Nurse Practitioners) who all work together to provide you with the care you need, when you need it.  . You will need a follow up appointment in 6 months .   Please call our office 2 months in advance to schedule this appointment.    . Providers on your designated Care Team:   . Murray Hodgkins, NP . Christell Faith, PA-C . Marrianne Mood, PA-C  Any Other Special Instructions Will Be Listed Below (If Applicable).  For educational health videos Log in to : [email or web address] Or : [email or web address], password : [email or web address]

## 2019-07-03 ENCOUNTER — Telehealth: Payer: Self-pay

## 2019-07-03 NOTE — Telephone Encounter (Signed)
The Repatha Sureclick Subcutaneous solution Auto-Injector 140 mg/mL is not covered on the patient's plan. Please consider Praluent inj for this is on the patient's preferred  Formulary. Please advise if okay to change to Praluent inj and if so, provide the correct dose and instructions.

## 2019-07-04 NOTE — Telephone Encounter (Signed)
Okay to switch to [employer]

## 2019-07-05 ENCOUNTER — Telehealth: Payer: Self-pay

## 2019-07-05 MED ORDER — PRALUENT 75 MG/ML ~~LOC~~ SOAJ: 75.0000 mg | SUBCUTANEOUS | 11 refills | Status: DC

## 2019-07-05 NOTE — Telephone Encounter (Signed)
PA started through [employer] Key: [id number] PA Case ID: [id number] Rx #: [id number]

## 2019-07-05 NOTE — Telephone Encounter (Signed)
Entered order for other medication and so you may see prior auth for this coming in.

## 2019-07-05 NOTE — Telephone Encounter (Signed)
Patient calling to check status of approval of substitute med .

## 2019-07-05 NOTE — Telephone Encounter (Signed)
Spoke with patient and reviewed that medication was sent into his pharmacy and that if he had any further questions to please give [place] a call. Also reviewed that if this was not cheaper we could assist with patient assistance forms if needed. He verbalized understanding with no further questions at this time.

## 2019-07-08 NOTE — Telephone Encounter (Signed)
Praluent prior authorization has been approved per fax from [facility] under member's Medicare Part D benefit. Approved quantity: 2 units per 28 days. 90 day supply may be filled except for those on Specialty Tier 5, which can only be filled as a 30 day supply. Pharmacy informed.

## 2019-07-09 ENCOUNTER — Telehealth: Payer: Self-pay | Admitting: Cardiovascular Disease

## 2019-07-09 NOTE — Telephone Encounter (Signed)
Pt c/o medication issue:  1. Name of Medication: Praluent   2. How are you currently taking this medication (dosage and times per day)? Not taking   3. Are you having a reaction (difficulty breathing--STAT)?   4. What is your medication issue? Patient unable to afford at cost of $170 a month   Please call to discuss

## 2019-07-09 NOTE — Telephone Encounter (Signed)
No answer and then it went to busy signal

## 2019-07-09 NOTE — Telephone Encounter (Signed)
Spoke with patient and reviewed that we could apply for patient assistance to see if he would qualify. He also reports having the paperwork completed for Repatha. He states that he will come in tomorrow to drop those off and advised that we can see which assistance he can get and go from there. He verbalized understanding with no further questions at this time.

## 2019-07-10 NOTE — Telephone Encounter (Signed)
Paperwork above my desk pending his signature on one page for [employer] and he mentioned that he had repatha application as well. Advised I would submit both and see which one he would get approved. Closing encounter until patient brings in information. CC'd Brandy in case she can assist with him getting medication.

## 2019-07-18 ENCOUNTER — Other Ambulatory Visit: Payer: Self-pay | Admitting: Family Medicine

## 2019-07-24 DIAGNOSIS — F331 Major depressive disorder, recurrent, moderate: Secondary | ICD-10-CM | POA: Diagnosis not present

## 2019-07-29 DIAGNOSIS — Z20828 Contact with and (suspected) exposure to other viral communicable diseases: Secondary | ICD-10-CM | POA: Diagnosis not present

## 2019-09-12 ENCOUNTER — Other Ambulatory Visit: Payer: Self-pay | Admitting: Family Medicine

## 2019-09-19 ENCOUNTER — Telehealth: Payer: Self-pay | Admitting: Cardiovascular Disease

## 2019-09-19 NOTE — Telephone Encounter (Signed)
Patient calling in to check on the status on the repatha application. Patient states he spoke with Repatha and they have no record of his application

## 2019-09-20 NOTE — Telephone Encounter (Signed)
Spoke with patient and reviewed that application was faxed and apologies for delay. He verbalized understanding with no further questions at this time.

## 2019-09-23 ENCOUNTER — Ambulatory Visit: Payer: Medicare Other | Admitting: Family Medicine

## 2019-10-17 DIAGNOSIS — L988 Other specified disorders of the skin and subcutaneous tissue: Secondary | ICD-10-CM | POA: Diagnosis not present

## 2019-10-17 DIAGNOSIS — D485 Neoplasm of uncertain behavior of skin: Secondary | ICD-10-CM | POA: Diagnosis not present

## 2019-10-17 DIAGNOSIS — L570 Actinic keratosis: Secondary | ICD-10-CM | POA: Diagnosis not present

## 2019-10-23 DIAGNOSIS — L570 Actinic keratosis: Secondary | ICD-10-CM | POA: Diagnosis not present

## 2019-12-13 ENCOUNTER — Other Ambulatory Visit: Payer: Self-pay | Admitting: Family Medicine

## 2019-12-18 ENCOUNTER — Other Ambulatory Visit: Payer: Self-pay

## 2019-12-18 ENCOUNTER — Ambulatory Visit (INDEPENDENT_AMBULATORY_CARE_PROVIDER_SITE_OTHER): Payer: Medicare Other | Admitting: Family Medicine

## 2019-12-18 ENCOUNTER — Encounter: Payer: Self-pay | Admitting: Family Medicine

## 2019-12-18 DIAGNOSIS — R7303 Prediabetes: Secondary | ICD-10-CM | POA: Diagnosis not present

## 2019-12-18 DIAGNOSIS — E782 Mixed hyperlipidemia: Secondary | ICD-10-CM | POA: Diagnosis not present

## 2019-12-18 DIAGNOSIS — I10 Essential (primary) hypertension: Secondary | ICD-10-CM

## 2019-12-18 DIAGNOSIS — I2510 Atherosclerotic heart disease of native coronary artery without angina pectoris: Secondary | ICD-10-CM | POA: Diagnosis not present

## 2019-12-18 DIAGNOSIS — N289 Disorder of kidney and ureter, unspecified: Secondary | ICD-10-CM | POA: Diagnosis not present

## 2019-12-18 DIAGNOSIS — K219 Gastro-esophageal reflux disease without esophagitis: Secondary | ICD-10-CM

## 2019-12-18 MED ORDER — TETANUS-DIPHTHERIA TOXOIDS TD 5-2 LFU IM INJ: 0.5000 mL | INJECTION | Freq: Once | INTRAMUSCULAR | 0 refills | Status: AC

## 2019-12-18 NOTE — Assessment & Plan Note (Signed)
Continue current regimen.  He will return for labs fasting.

## 2019-12-18 NOTE — Assessment & Plan Note (Signed)
Check A1c. 

## 2019-12-18 NOTE — Assessment & Plan Note (Signed)
Adequate control for age.  Borderline low diastolics at home limited titration of medications.  He will continue his current regimen.

## 2019-12-18 NOTE — Assessment & Plan Note (Signed)
Relatively asymptomatic.  He will continue on omeprazole.  Discussed avoiding food intake 3 hours prior to bed.  Discussed risk of long-term use with omeprazole including decreased absorption of certain minerals, increased risk of fracture, risk of renal dysfunction, and risk of infections.  Given the patient's prior issues with esophageal stenosis and intermittent GERD issues on medication I believe the benefits outweigh the risks of continuing medication.

## 2019-12-18 NOTE — Assessment & Plan Note (Signed)
Noted on prior labs.  It does not appear that he was ever contacted after his most recent lab results.  He reports taking Aleve 2-3 times a day.  I advised him to discontinue this.  He can take Tylenol for discomfort.  We will check his kidney function and then determine what further work-up needs to be completed at that time.

## 2019-12-18 NOTE — Assessment & Plan Note (Signed)
Asymptomatic.  Continue current regimen. 

## 2019-12-18 NOTE — Patient Instructions (Signed)
Nice to see you. We will have you return for labs. Please continue your current medications.

## 2019-12-18 NOTE — Progress Notes (Signed)
Tommi Rumps, MD Phone: [phone]  Rodney Delacruz is a 86 y.o. male who presents today for f/u.  HYPERTENSION/CAD/HLD Disease Monitoring  Home BP Monitoring 144/64 average Chest pain- no    Dyspnea- no Medications  Compliance-  Taking praluent, zetia, metoprolol, ramipril. Edema- no No RUQ pain. No myalgias.  GERD:   Reflux symptoms: only if Rodney Delacruz eats late. Only occurs 2-3x/months   Abd pain: no   Blood in stool: no  Dysphagia: no   EGD: in the past had a stenosis dilated. No issues since his last EGD 8-10 years ago  Medication: omeprazole.      Social History   Tobacco Use  Smoking Status Former Smoker  . Packs/day: 1.00  . Years: 20.00  . Pack years: 20.00  . Types: Cigarettes  . Quit date: 08/15/1976  . Years since quitting: 43.3  Smokeless Tobacco Never Used     ROS see history of present illness  Objective  Physical Exam Vitals:   12/18/19 1316  BP: 140/78  Pulse: 64  Temp: (!) 97.2 F (36.2 C)  SpO2: 97%    BP Readings from Last 3 Encounters:  12/18/19 140/78  06/26/19 (!) 146/60  10/03/18 134/62   Wt Readings from Last 3 Encounters:  12/18/19 181 lb (82.1 kg)  06/26/19 180 lb (81.6 kg)  03/20/19 184 lb (83.5 kg)    Physical Exam Constitutional:      General: Rodney Delacruz is not in acute distress.    Appearance: Rodney Delacruz is not diaphoretic.  Cardiovascular:     Rate and Rhythm: Normal rate and regular rhythm.     Heart sounds: Normal heart sounds.  Pulmonary:     Effort: Pulmonary effort is normal.     Breath sounds: Normal breath sounds.  Abdominal:     General: Bowel sounds are normal. There is no distension.     Palpations: Abdomen is soft.     Tenderness: There is no abdominal tenderness. There is no guarding or rebound.  Musculoskeletal:     Right lower leg: No edema.     Left lower leg: No edema.  Skin:    General: Skin is warm and dry.  Neurological:     Mental Status: Rodney Delacruz is alert.      Assessment/Plan: Please see individual  problem list.  GERD (gastroesophageal reflux disease) Relatively asymptomatic.  Rodney Delacruz will continue on omeprazole.  Discussed avoiding food intake 3 hours prior to bed.  Discussed risk of long-term use with omeprazole including decreased absorption of certain minerals, increased risk of fracture, risk of renal dysfunction, and risk of infections.  Given the patient's prior issues with esophageal stenosis and intermittent GERD issues on medication I believe the benefits outweigh the risks of continuing medication.  CAD (coronary artery disease) Asymptomatic.  Continue current regimen.  Essential hypertension Adequate control for age.  Borderline low diastolics at home limited titration of medications.  Rodney Delacruz will continue his current regimen.  Mixed hyperlipidemia Continue current regimen.  Rodney Delacruz will return for labs fasting.  Prediabetes Check A1c.  Kidney dysfunction Noted on prior labs.  It does not appear that Rodney Delacruz was ever contacted after his most recent lab results.  Rodney Delacruz reports taking Aleve 2-3 times a day.  I advised him to discontinue this.  Rodney Delacruz can take Tylenol for discomfort.  We will check his kidney function and then determine what further work-up needs to be completed at that time.   Health Maintenance: Td sent to pharmacy.  Discussed patient will need to  get this there based on his insurance.  Orders Placed This Encounter  Procedures  . Lipid panel    Standing Status:   Future    Standing Expiration Date:   12/17/2020  . Comp Met (CMET)    Standing Status:   Future    Standing Expiration Date:   12/17/2020  . Hemoglobin A1c    Standing Status:   Future    Standing Expiration Date:   12/17/2020    Meds ordered this encounter  Medications  . tetanus & diphtheria toxoids, adult, (TENIVAC) 5-2 LFU injection    Sig: Inject 0.5 mLs into the muscle once for 1 dose.    Dispense:  0.5 mL    Refill:  0    This visit occurred during the SARS-CoV-2 public health emergency.  Safety  protocols were in place, including screening questions prior to the visit, additional usage of staff PPE, and extensive cleaning of exam room while observing appropriate contact time as indicated for disinfecting solutions.    Tommi Rumps, MD [place]

## 2019-12-23 NOTE — Progress Notes (Signed)
Date:  12/24/2019   ID:  Rodney Delacruz, DOB [birth year], MRN [id number]  Patient Location:  [address] [place] [address]   Provider location:   [place], [place] office  PCP:  [name], MD  Cardiologist:  Arvid Right [facility]  Chief Complaint  Patient presents with  . OTHER    6 month f/u no complaints today. Meds reveiwed verbally with pt.     History of Present Illness:    Patient has a past medical history of coronary artery disease, prior PCI and bypass surgery in 2008 at [facility],  presenting to [facility] for chest pain on 10/30/2012.  ruled out for MI and had a Myoview study that showed no ischemia.  GERD.  prior smoking history, stopped in the 70s,  no diabetes He presents today for routine followup of his coronary artery disease  Reports feeling well, no chest pain, no significant shortness of breath on exertion Typically walks several times per week  No significant arrhythmia spells Blood pressure running high at home reports typically mid 140s up to 160s Weight is stable  Scheduled for repeat lab work today  Discussed previous statin myalgias on Crestor Lipitor, has been taking Zetia sporadically  May be willing to retry a statin at low-dose  EKG personally reviewed by myself on todays visit Shows normal sinus rhythm rate 57 bpm unable to exclude old inferior MI   Prior CV studies:   The following studies were reviewed today:  Last cardiac catheterization 2009  Stress test February 2014   Past Medical History:  Diagnosis Date  . Allergy   . Anxiety   . Coronary artery disease   . Depression   . Elevated PSA    Followed by Dr. Rosana Berger every 6 month  . Heart murmur   . Hyperlipidemia   . Hypertension   . MI (myocardial infarction) ([place]) 2009  . Ulcer    Past Surgical History:  Procedure Laterality Date  . CARDIAC CATHETERIZATION  2005, 2009  . CHOLECYSTECTOMY  2001  . CORONARY ANGIOPLASTY  2005   s/p  stent placement @ [facility]  . CORONARY ARTERY BYPASS GRAFT  2009   [facility]  . TONSILLECTOMY AND ADENOIDECTOMY  1942     Current Meds  Medication Sig  . aspirin 81 MG tablet Take 81 mg by mouth daily.  Marland Kitchen buPROPion (WELLBUTRIN XL) 300 MG 24 hr tablet Take 300 mg by mouth daily.  Marland Kitchen ezetimibe (ZETIA) 10 MG tablet Take 1 tablet (10 mg total) by mouth daily.  . fluticasone (FLONASE) 50 MCG/ACT nasal spray SPRAY 2 SPRAYS INTO EACH NOSTRIL EVERY DAY  . metoprolol succinate (TOPROL-XL) 25 MG 24 hr tablet Take 1 tablet (25 mg total) by mouth daily.  . Multiple Vitamin (MULTI-VITAMIN DAILY PO) Take by mouth.  . nitroGLYCERIN (NITROSTAT) 0.4 MG SL tablet PLACE 1 TABLET UNDER THE TONGUE EVERY 5 MINUTES AS NEEDED FOR CHEST PAIN. MAXIMUM OF 3 DOSES.  Marland Kitchen omeprazole (PRILOSEC) 20 MG capsule TAKE 1 CAPSULE BY MOUTH EVERY DAY  . ramipril (ALTACE) 10 MG capsule Take 1 capsule (10 mg total) by mouth daily.  . sildenafil (VIAGRA) 50 MG tablet TAKE 1-2 TABLETS BY MOUTH DAILY AS NEEDED FOR FOR ERECTILE DYSFUNCTION     Allergies:   Plavix [clopidogrel bisulfate]   Social History   Tobacco Use  . Smoking status: Former Smoker    Packs/day: 1.00    Years: 20.00    Pack years: 20.00  Types: Cigarettes    Quit date: 08/15/1976    Years since quitting: 43.3  . Smokeless tobacco: Never Used  Substance Use Topics  . Alcohol use: Yes    Alcohol/week: 0.0 standard drinks    Comment: Occasional  . Drug use: No     Current Outpatient Medications on File Prior to Visit  Medication Sig Dispense Refill  . aspirin 81 MG tablet Take 81 mg by mouth daily.    Marland Kitchen buPROPion (WELLBUTRIN XL) 300 MG 24 hr tablet Take 300 mg by mouth daily.    Marland Kitchen ezetimibe (ZETIA) 10 MG tablet Take 1 tablet (10 mg total) by mouth daily. 30 tablet 11  . fluticasone (FLONASE) 50 MCG/ACT nasal spray SPRAY 2 SPRAYS INTO EACH NOSTRIL EVERY DAY 48 mL 0  . metoprolol succinate (TOPROL-XL) 25 MG 24 hr tablet Take 1 tablet (25 mg total) by mouth daily.  90 tablet 3  . Multiple Vitamin (MULTI-VITAMIN DAILY PO) Take by mouth.    . nitroGLYCERIN (NITROSTAT) 0.4 MG SL tablet PLACE 1 TABLET UNDER THE TONGUE EVERY 5 MINUTES AS NEEDED FOR CHEST PAIN. MAXIMUM OF 3 DOSES. 25 tablet 0  . omeprazole (PRILOSEC) 20 MG capsule TAKE 1 CAPSULE BY MOUTH EVERY DAY 90 capsule 3  . ramipril (ALTACE) 10 MG capsule Take 1 capsule (10 mg total) by mouth daily. 90 capsule 3  . sildenafil (VIAGRA) 50 MG tablet TAKE 1-2 TABLETS BY MOUTH DAILY AS NEEDED FOR FOR ERECTILE DYSFUNCTION 10 tablet 0   No current facility-administered medications on file prior to visit.     Family Hx: The patient's family history includes Alcohol abuse in his father and paternal grandfather; Diabetes in his sister; Heart attack in his brother; Heart disease in his brother; Stroke in his mother.  ROS:   Please see the history of present illness.    Review of Systems  Constitutional: Negative.   HENT: Negative.   Respiratory: Negative.   Cardiovascular: Negative.   Gastrointestinal: Negative.   Musculoskeletal: Negative.        Leg weakness  Neurological: Negative.   Psychiatric/Behavioral: Negative.   All other systems reviewed and are negative.    Labs/Other Tests and Data Reviewed:    Recent Labs: 04/23/2019: ALT 15 05/14/2019: BUN 20; Creatinine, Ser 1.43; Potassium 5.0; Sodium 138   Recent Lipid Panel Lab Results  Component Value Date/Time   CHOL 154 09/19/2018 10:04 AM   CHOL 148 10/01/2012 01:05 PM   TRIG 64.0 09/19/2018 10:04 AM   TRIG 89 10/01/2012 01:05 PM   HDL 47.10 09/19/2018 10:04 AM   HDL 49 10/01/2012 01:05 PM   CHOLHDL 3 09/19/2018 10:04 AM   LDLCALC 94 09/19/2018 10:04 AM   LDLCALC 81 10/01/2012 01:05 PM   LDLDIRECT 124.0 04/23/2019 08:28 AM    Wt Readings from Last 3 Encounters:  12/24/19 179 lb (81.2 kg)  12/18/19 181 lb (82.1 kg)  06/26/19 180 lb (81.6 kg)     Exam:    BP (!) 160/62 (BP Location: Left Arm, Patient Position: Sitting, Cuff Size:  Normal)   Pulse (!) 57   Ht 5' 11.5" (1.816 m)   Wt 179 lb (81.2 kg)   SpO2 94%   BMI 24.62 kg/m  Constitutional:  oriented to person, place, and time. No distress.  HENT:  Head: Grossly normal Eyes:  no discharge. No scleral icterus.  Neck: No JVD, no carotid bruits  Cardiovascular: Regular rate and rhythm, no murmurs appreciated Pulmonary/Chest: Clear to auscultation bilaterally, no wheezes  or rails Abdominal: Soft.  no distension.  no tenderness.  Musculoskeletal: Normal range of motion Neurological:  normal muscle tone. Coordination normal. 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 No further workup at this time. Continue current medication regimen.  Essential hypertension Reports blood pressure running high at home Mid 140s to 160s per his report We will recommend he add amlodipine 5 mg with slow titration up to 10 mg if needed Recommended goal systolic pressures [id number] or less  Mixed hyperlipidemia Unable to afford PCSK9 inhibitor Lab work pending today, we will follow May need to continue Zetia and Crestor 5 mg every other day Plan discussed with him, alternatively could add Livalo  S/P CABG (coronary artery bypass graft) No further testing at this time    Total encounter time more than 25 minutes  Greater than 50% was spent in counseling and coordination of care with the patient   Disposition: Follow-up in 12 months   Signed, Ida Rogue, MD  12/24/2019 9:39 AM    [place] Office [address], [place], [place] [address]

## 2019-12-24 ENCOUNTER — Other Ambulatory Visit: Payer: Self-pay

## 2019-12-24 ENCOUNTER — Encounter: Payer: Self-pay | Admitting: Cardiovascular Disease

## 2019-12-24 ENCOUNTER — Ambulatory Visit (INDEPENDENT_AMBULATORY_CARE_PROVIDER_SITE_OTHER): Payer: Medicare Other | Admitting: Cardiovascular Disease

## 2019-12-24 ENCOUNTER — Other Ambulatory Visit (INDEPENDENT_AMBULATORY_CARE_PROVIDER_SITE_OTHER): Payer: Medicare Other

## 2019-12-24 VITALS — BP 160/62 | HR 57 | Ht 71.5 in | Wt 179.0 lb

## 2019-12-24 DIAGNOSIS — I2510 Atherosclerotic heart disease of native coronary artery without angina pectoris: Secondary | ICD-10-CM

## 2019-12-24 DIAGNOSIS — I10 Essential (primary) hypertension: Secondary | ICD-10-CM

## 2019-12-24 DIAGNOSIS — E782 Mixed hyperlipidemia: Secondary | ICD-10-CM | POA: Diagnosis not present

## 2019-12-24 DIAGNOSIS — I25118 Atherosclerotic heart disease of native coronary artery with other forms of angina pectoris: Secondary | ICD-10-CM | POA: Diagnosis not present

## 2019-12-24 DIAGNOSIS — R7303 Prediabetes: Secondary | ICD-10-CM

## 2019-12-24 DIAGNOSIS — Z951 Presence of aortocoronary bypass graft: Secondary | ICD-10-CM

## 2019-12-24 LAB — COMPREHENSIVE METABOLIC PANEL
ALT: 14 U/L (ref 0–53)
AST: 16 U/L (ref 0–37)
Albumin: 4 g/dL (ref 3.5–5.2)
Alkaline Phosphatase: 76 U/L (ref 39–117)
BUN: 22 mg/dL (ref 6–23)
CO2: 28 mEq/L (ref 19–32)
Calcium: 8.7 mg/dL (ref 8.4–10.5)
Chloride: 105 mEq/L (ref 96–112)
Creatinine, Ser: 1.27 mg/dL (ref 0.40–1.50)
GFR: 54.04 mL/min — ABNORMAL LOW (ref >60.00)
Glucose, Bld: 101 mg/dL — ABNORMAL HIGH (ref 70–99)
Potassium: 4.6 mEq/L (ref 3.5–5.1)
Sodium: 137 mEq/L (ref 135–145)
Total Bilirubin: 0.6 mg/dL (ref 0.2–1.2)
Total Protein: 6.5 g/dL (ref 6.0–8.3)

## 2019-12-24 LAB — LIPID PANEL
Cholesterol: 176 mg/dL (ref 0–200)
HDL: 45.3 mg/dL (ref >39.00)
LDL Cholesterol: 116 mg/dL — ABNORMAL HIGH (ref 0–99)
NonHDL: 130.36
Total CHOL/HDL Ratio: 4
Triglycerides: 72 mg/dL (ref 0.0–149.0)
VLDL: 14.4 mg/dL (ref 0.0–40.0)

## 2019-12-24 LAB — HEMOGLOBIN A1C: Hgb A1c MFr Bld: 5.8 % (ref 4.6–6.5)

## 2019-12-24 MED ORDER — AMLODIPINE BESYLATE 10 MG PO TABS: 10.0000 mg | ORAL_TABLET | Freq: Every day | ORAL | 3 refills | Status: DC

## 2019-12-24 NOTE — Patient Instructions (Addendum)
Goal total chol <150 Goal LDL <70  Options include zetia daily with crestor 5  Every other day Or try zetia with LIVALO   Medication Instructions:  Please add amlodipine 10 mg daily  If you need a refill on your cardiac medications before your next appointment, please call your pharmacy.    Lab work: No new labs needed   If you have labs (blood work) drawn today and your tests are completely normal, you will receive your results only by: Marland Kitchen MyChart Message (if you have MyChart) OR . A paper copy in the mail If you have any lab test that is abnormal or we need to change your treatment, we will call you to review the results.   Testing/Procedures: No new testing needed   Follow-Up: At [facility], you and your health needs are our priority.  As part of our continuing mission to provide you with exceptional heart care, we have created designated Provider Care Teams.  These Care Teams include your primary Cardiologist (physician) and Advanced Practice Providers (APPs -  Physician Assistants and Nurse Practitioners) who all work together to provide you with the care you need, when you need it.  . You will need a follow up appointment in 12 months   . Providers on your designated Care Team:   . Murray Hodgkins, NP . Christell Faith, PA-C . Marrianne Mood, PA-C  Any Other Special Instructions Will Be Listed Below (If Applicable).  For educational health videos Log in to : [email or web address] Or : [email or web address], password : [email or web address]

## 2019-12-25 ENCOUNTER — Other Ambulatory Visit: Payer: Medicare Other

## 2019-12-26 ENCOUNTER — Other Ambulatory Visit: Payer: Self-pay | Admitting: Family Medicine

## 2019-12-26 DIAGNOSIS — E785 Hyperlipidemia, unspecified: Secondary | ICD-10-CM

## 2019-12-26 MED ORDER — ROSUVASTATIN CALCIUM 5 MG PO TABS: 5.0000 mg | ORAL_TABLET | ORAL | 1 refills | Status: DC

## 2020-01-02 ENCOUNTER — Other Ambulatory Visit: Payer: Self-pay | Admitting: Cardiovascular Disease

## 2020-01-29 ENCOUNTER — Other Ambulatory Visit: Payer: Self-pay | Admitting: Family Medicine

## 2020-02-11 ENCOUNTER — Other Ambulatory Visit (INDEPENDENT_AMBULATORY_CARE_PROVIDER_SITE_OTHER): Payer: Medicare Other

## 2020-02-11 ENCOUNTER — Other Ambulatory Visit: Payer: Self-pay

## 2020-02-11 DIAGNOSIS — E785 Hyperlipidemia, unspecified: Secondary | ICD-10-CM | POA: Diagnosis not present

## 2020-02-11 LAB — HEPATIC FUNCTION PANEL
ALT: 19 U/L (ref 0–53)
AST: 19 U/L (ref 0–37)
Albumin: 4.2 g/dL (ref 3.5–5.2)
Alkaline Phosphatase: 77 U/L (ref 39–117)
Bilirubin, Direct: 0.1 mg/dL (ref 0.0–0.3)
Total Bilirubin: 0.6 mg/dL (ref 0.2–1.2)
Total Protein: 6.5 g/dL (ref 6.0–8.3)

## 2020-02-11 LAB — LIPID PANEL
Cholesterol: 127 mg/dL (ref 0–200)
HDL: 54.6 mg/dL (ref >39.00)
LDL Cholesterol: 61 mg/dL (ref 0–99)
NonHDL: 72.66
Total CHOL/HDL Ratio: 2
Triglycerides: 58 mg/dL (ref 0.0–149.0)
VLDL: 11.6 mg/dL (ref 0.0–40.0)

## 2020-03-08 ENCOUNTER — Other Ambulatory Visit: Payer: Self-pay | Admitting: Cardiovascular Disease

## 2020-04-06 ENCOUNTER — Other Ambulatory Visit: Payer: Self-pay | Admitting: Cardiovascular Disease

## 2020-04-22 DIAGNOSIS — L570 Actinic keratosis: Secondary | ICD-10-CM | POA: Diagnosis not present

## 2020-04-22 DIAGNOSIS — L988 Other specified disorders of the skin and subcutaneous tissue: Secondary | ICD-10-CM | POA: Diagnosis not present

## 2020-04-22 DIAGNOSIS — C44319 Basal cell carcinoma of skin of other parts of face: Secondary | ICD-10-CM | POA: Diagnosis not present

## 2020-04-22 DIAGNOSIS — L820 Inflamed seborrheic keratosis: Secondary | ICD-10-CM | POA: Diagnosis not present

## 2020-04-29 DIAGNOSIS — C44329 Squamous cell carcinoma of skin of other parts of face: Secondary | ICD-10-CM | POA: Diagnosis not present

## 2020-05-01 ENCOUNTER — Other Ambulatory Visit: Payer: Self-pay | Admitting: Family Medicine

## 2020-05-05 NOTE — Telephone Encounter (Signed)
Spoken to patient he stated he has not had any chest px, SOB, and has not had to take his nitro.

## 2020-05-05 NOTE — Telephone Encounter (Signed)
Please follow-up with the patient to see if he has had any issues with chest pain or shortness of breath.  Please see if he has had to take the nitroglycerin.  Thanks.

## 2020-05-11 DIAGNOSIS — Z23 Encounter for immunization: Secondary | ICD-10-CM | POA: Diagnosis not present

## 2020-06-04 ENCOUNTER — Other Ambulatory Visit: Payer: Self-pay | Admitting: Cardiovascular Disease

## 2020-06-23 ENCOUNTER — Other Ambulatory Visit: Payer: Self-pay

## 2020-06-23 ENCOUNTER — Ambulatory Visit (INDEPENDENT_AMBULATORY_CARE_PROVIDER_SITE_OTHER): Payer: Medicare Other | Admitting: Family Medicine

## 2020-06-23 ENCOUNTER — Ambulatory Visit: Payer: Medicare Other | Admitting: Family Medicine

## 2020-06-23 ENCOUNTER — Encounter: Payer: Self-pay | Admitting: Family Medicine

## 2020-06-23 VITALS — BP 134/70 | HR 67 | Temp 97.6°F | Ht 71.5 in | Wt 177.0 lb

## 2020-06-23 DIAGNOSIS — M2042 Other hammer toe(s) (acquired), left foot: Secondary | ICD-10-CM

## 2020-06-23 DIAGNOSIS — E782 Mixed hyperlipidemia: Secondary | ICD-10-CM

## 2020-06-23 DIAGNOSIS — I2510 Atherosclerotic heart disease of native coronary artery without angina pectoris: Secondary | ICD-10-CM | POA: Diagnosis not present

## 2020-06-23 DIAGNOSIS — M2041 Other hammer toe(s) (acquired), right foot: Secondary | ICD-10-CM | POA: Diagnosis not present

## 2020-06-23 DIAGNOSIS — I739 Peripheral vascular disease, unspecified: Secondary | ICD-10-CM

## 2020-06-23 DIAGNOSIS — I10 Essential (primary) hypertension: Secondary | ICD-10-CM

## 2020-06-23 DIAGNOSIS — M791 Myalgia, unspecified site: Secondary | ICD-10-CM | POA: Insufficient documentation

## 2020-06-23 NOTE — Assessment & Plan Note (Signed)
Asymptomatic.  Continue risk factor management. 

## 2020-06-23 NOTE — Progress Notes (Signed)
Tommi Rumps, MD Phone: [phone]  Rodney Delacruz is a 86 y.o. male who presents today for f/u.  HYPERTENSION  Disease Monitoring  Home BP Monitoring [id number] systolic typically, occasionally down to 335 systolic Chest pain- no    Dyspnea- no Medications  Compliance-  Taking amlodipine, metoprolol, ramipril. Lightheadedness-  no  Edema- no  HYPERLIPIDEMIA Symptoms Chest pain on exertion:  no   Leg claudication:   Yes, legs get sore when he walks Medications: Compliance- taking crestor and zetia Right upper quadrant pain- no  Muscle aches- no  Hammertoes: Patient notes issues with these bilaterally.  The toes do not hurt though the dorsum of his feet does hurt at times.      Social History   Tobacco Use  Smoking Status Former Smoker  . Packs/day: 1.00  . Years: 20.00  . Pack years: 20.00  . Types: Cigarettes  . Quit date: 08/15/1976  . Years since quitting: 43.8  Smokeless Tobacco Never Used     ROS see history of present illness  Objective  Physical Exam Vitals:   06/23/20 1500 06/23/20 1518  BP: (!) 142/64 134/70  Pulse: 67   Temp: 97.6 F (36.4 C)   SpO2: 97%     BP Readings from Last 3 Encounters:  06/23/20 134/70  12/24/19 (!) 160/62  12/18/19 140/78   Wt Readings from Last 3 Encounters:  06/23/20 177 lb (80.3 kg)  12/24/19 179 lb (81.2 kg)  12/18/19 181 lb (82.1 kg)    Physical Exam Constitutional:      General: He is not in acute distress.    Appearance: He is not diaphoretic.  Cardiovascular:     Rate and Rhythm: Normal rate and regular rhythm.     Heart sounds: Normal heart sounds.     Comments: Intact DP pulse left foot, otherwise bilateral PT pulses and right DP pulse are difficult to palpate Pulmonary:     Effort: Pulmonary effort is normal.     Breath sounds: Normal breath sounds.  Musculoskeletal:     Right lower leg: No edema.     Left lower leg: No edema.  Feet:     Comments: Hammertoes bilaterally Skin:    General:  Skin is warm and dry.  Neurological:     Mental Status: He is alert.      Assessment/Plan: Please see individual problem list.  Problem List Items Addressed This Visit    CAD (coronary artery disease)    Asymptomatic.  Continue risk factor management.      Essential hypertension    Well-controlled.  Continue amlodipine 10 mg once daily, metoprolol 25 mg daily, and ramipril 10 mg once daily.  Check BMP.      Relevant Orders   Basic Metabolic Panel (BMET)   Hammer toes of both feet    Refer to podiatry.      Relevant Orders   Ambulatory referral to Podiatry   Intermittent claudication ([place]) - Primary    Symptoms concerning for claudication.  Prior ABIs reviewed.  Will refer to vascular surgery to get their input.      Relevant Orders   Ambulatory referral to Vascular Surgery   Mixed hyperlipidemia    Continue Crestor 5 mg once daily and Zetia 10 mg once daily.  Most recent LDL well controlled.         This visit occurred during the SARS-CoV-2 public health emergency.  Safety protocols were in place, including screening questions prior to the visit, additional usage of  staff PPE, and extensive cleaning of exam room while observing appropriate contact time as indicated for disinfecting solutions.    Tommi Rumps, MD [place]

## 2020-06-23 NOTE — Assessment & Plan Note (Signed)
Symptoms concerning for claudication.  Prior ABIs reviewed.  Will refer to vascular surgery to get their input.

## 2020-06-23 NOTE — Assessment & Plan Note (Signed)
Refer to podiatry

## 2020-06-23 NOTE — Assessment & Plan Note (Signed)
Continue Crestor 5 mg once daily and Zetia 10 mg once daily.  Most recent LDL well controlled.

## 2020-06-23 NOTE — Patient Instructions (Signed)
Nice to see you. We are referring you to vascular surgery. We are referring you to podiatry. We will contact you with your lab results.

## 2020-06-23 NOTE — Assessment & Plan Note (Signed)
Well-controlled.  Continue amlodipine 10 mg once daily, metoprolol 25 mg daily, and ramipril 10 mg once daily.  Check BMP.

## 2020-06-24 LAB — BASIC METABOLIC PANEL
BUN: 19 mg/dL (ref 6–23)
CO2: 27 mEq/L (ref 19–32)
Calcium: 8.6 mg/dL (ref 8.4–10.5)
Chloride: 104 mEq/L (ref 96–112)
Creatinine, Ser: 1.43 mg/dL (ref 0.40–1.50)
GFR: 45.03 mL/min — ABNORMAL LOW (ref >60.00)
Glucose, Bld: 84 mg/dL (ref 70–99)
Potassium: 4.5 mEq/L (ref 3.5–5.1)
Sodium: 139 mEq/L (ref 135–145)

## 2020-06-25 DIAGNOSIS — H353131 Nonexudative age-related macular degeneration, bilateral, early dry stage: Secondary | ICD-10-CM | POA: Diagnosis not present

## 2020-06-30 ENCOUNTER — Encounter (INDEPENDENT_AMBULATORY_CARE_PROVIDER_SITE_OTHER): Payer: Self-pay | Admitting: Nurse Practitioner

## 2020-06-30 ENCOUNTER — Other Ambulatory Visit: Payer: Self-pay

## 2020-06-30 ENCOUNTER — Ambulatory Visit (INDEPENDENT_AMBULATORY_CARE_PROVIDER_SITE_OTHER): Payer: Medicare Other | Admitting: Nurse Practitioner

## 2020-06-30 VITALS — BP 127/68 | HR 67 | Ht 72.0 in | Wt 177.0 lb

## 2020-06-30 DIAGNOSIS — E782 Mixed hyperlipidemia: Secondary | ICD-10-CM | POA: Diagnosis not present

## 2020-06-30 DIAGNOSIS — I739 Peripheral vascular disease, unspecified: Secondary | ICD-10-CM | POA: Diagnosis not present

## 2020-06-30 DIAGNOSIS — I10 Essential (primary) hypertension: Secondary | ICD-10-CM

## 2020-06-30 NOTE — Progress Notes (Signed)
Subjective:    Patient ID: Rodney Delacruz, male    DOB: Oct 25, 1933, 86 y.o.   MRN: [id number] Chief Complaint  Patient presents with  . Follow-up    est.pt intermittent claudication     The patient is seen for evaluation of painful lower extremities and diminished pulses. Patient notes the pain is always associated with activity and is somewhat consistent day today. Typically, the pain occurs at less than one block, progress is as activity continues to the point that the patient must stop walking. Resting including standing still for several minutes allowed resumption of the activity and the ability to walk a similar distance before stopping again. Uneven terrain and inclined shorten the distance. The pain has been progressive over the past several years. The patient states the inability to walk is now having a profound negative impact on quality of life and daily activities.  The patient denies rest pain or dangling of an extremity off the side of the bed during the night for relief. No open wounds or sores at this time. No prior interventions or surgeries.  The patient was previously seen in our office in 2019 for a similar issue.  The patient was found to have noncompressible ABIs with mostly triphasic waveforms.  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   The patient denies changes in claudication symptoms or new rest pain symptoms.  No new ulcers or wounds of the foot.  The patient's blood pressure has been stable and relatively well controlled. The patient denies amaurosis fugax or recent TIA symptoms. There are no recent neurological changes noted. The patient denies history of DVT, PE or superficial thrombophlebitis. The patient denies recent episodes of angina or shortness of breath.    Review of Systems  Cardiovascular:       Claudication  Musculoskeletal: Positive for arthralgias.  All other systems reviewed and are negative.      Objective:    Physical Exam Vitals reviewed.  HENT:     Head: Normocephalic.  Cardiovascular:     Rate and Rhythm: Normal rate.     Pulses: Decreased pulses.  Pulmonary:     Effort: Pulmonary effort is normal.  Skin:    General: Skin is warm and dry.  Neurological:     Mental Status: He is alert and oriented to person, place, and time.  Psychiatric:        Mood and Affect: Mood normal.        Behavior: Behavior normal.        Thought Content: Thought content normal.        Judgment: Judgment normal.     BP 127/68   Pulse 67   Ht 6' (1.829 m)   Wt 177 lb (80.3 kg)   BMI 24.01 kg/m   Past Medical History:  Diagnosis Date  . Allergy   . Anxiety   . Coronary artery disease   . Depression   . Elevated PSA    Followed by Dr. Rosana Berger every 6 month  . Heart murmur   . Hyperlipidemia   . Hypertension   . MI (myocardial infarction) ([place]) 2009  . Ulcer     Social History   Socioeconomic History  . Marital status: Married    Spouse name: Olin Hauser  . Number of children: 2  . Years of education: 87  . Highest education level: Not on file  Occupational History  . Occupation: Information systems manager    Comment: Retired  Tobacco Use  . Smoking status: Former Smoker    Packs/day: 1.00    Years: 20.00    Pack years: 20.00    Types: Cigarettes    Quit date: 08/15/1976    Years since quitting: 43.9  . Smokeless tobacco: Never Used  Substance and Sexual Activity  . Alcohol use: Yes    Alcohol/week: 0.0 standard drinks    Comment: Occasional  . Drug use: No  . Sexual activity: Not on file  Other Topics Concern  . Not on file  Social History Narrative   Mr. Star grew up in [place], [place]. He currently lives in [place] with his wife, Olin Hauser, of 14 years. He was married previously to his high school sweetheart. They were married for 33 years. He widowed in 1993. They had two son Christia [place] and Thadeus). Mr. Ruan sons live locally [facility] [place] and [place]). He worked at [employer] and retired from [employer]. He was there for 20 years. He worked in Designer, television/film set. He enjoys fishing, reading, watching TV on his spare time. He also enjoys walking and visiting friends.   Social Determinants of Health   Financial Resource Strain:   . Difficulty of Paying Living Expenses: Not on file  Food Insecurity:   . Worried About Charity fundraiser in the Last Year: Not on file  . Ran Out of Food in the Last Year: Not on file  Transportation Needs:   . Lack of Transportation (Medical): Not on file  . Lack of Transportation (Non-Medical): Not on file  Physical Activity:   . Days of Exercise per Week: Not on file  . Minutes of Exercise per Session: Not on file  Stress:   . Feeling of Stress : Not on file  Social Connections:   . Frequency of Communication with Friends and Family: Not on file  . Frequency of Social Gatherings with Friends and Family: Not on file  . Attends Religious Services: Not on file  . Active Member of Clubs or Organizations: Not on file  . Attends Archivist Meetings: Not on file  . Marital Status: Not on file  Intimate Partner Violence:   . Fear of Current or Ex-Partner: Not on file  . Emotionally Abused: Not on file  . Physically Abused: Not on file  . Sexually Abused: Not on file    Past Surgical History:  Procedure Laterality Date  . CARDIAC CATHETERIZATION  2005, 2009  . CHOLECYSTECTOMY  2001  . CORONARY ANGIOPLASTY  2005   s/p stent placement @ [facility]  . CORONARY ARTERY BYPASS GRAFT  2009   [facility]  . TONSILLECTOMY AND ADENOIDECTOMY  1942    Family History  Problem Relation Age of Onset  . Heart attack Brother   . Heart disease Brother        CAD  . Alcohol abuse Father   . Diabetes Sister   . Stroke Mother   . Alcohol abuse Paternal Grandfather     Allergies  Allergen Reactions  . Plavix [Clopidogrel Bisulfate] Rash    CBC Latest Ref Rng & Units 04/24/2017 10/19/2016 08/03/2016  WBC 4.0 - 10.5  K/uL 8.3 8.7 9.2  Hemoglobin 13.0 - 17.0 g/dL 12.6(L) 12.3(L) 12.4(L)  Hematocrit 39 - 52 % 38.5(L) 37.1(L) 37.6(L)  Platelets 150 - 400 K/uL 265.0 271.0 272.0      CMP     Component Value Date/Time   NA 139 06/23/2020 1522   NA 142 10/02/2012 0533  K 4.5 06/23/2020 1522   K 4.1 10/02/2012 0533   CL 104 06/23/2020 1522   CL 108 (H) 10/02/2012 0533   CO2 27 06/23/2020 1522   CO2 27 10/02/2012 0533   GLUCOSE 84 06/23/2020 1522   GLUCOSE 101 (H) 10/02/2012 0533   BUN 19 06/23/2020 1522   BUN 18 10/02/2012 0533   CREATININE 1.43 06/23/2020 1522   CREATININE 1.17 10/02/2012 0533   CALCIUM 8.6 06/23/2020 1522   CALCIUM 8.7 10/02/2012 0533   PROT 6.5 02/11/2020 0926   PROT 7.8 10/01/2012 1305   ALBUMIN 4.2 02/11/2020 0926   ALBUMIN 3.9 10/01/2012 1305   AST 19 02/11/2020 0926   AST 30 10/01/2012 1305   ALT 19 02/11/2020 0926   ALT 27 10/01/2012 1305   ALKPHOS 77 02/11/2020 0926   ALKPHOS 80 10/01/2012 1305   BILITOT 0.6 02/11/2020 0926   BILITOT 0.5 10/01/2012 1305   GFRNONAA >60 10/02/2012 0533   GFRAA >60 10/02/2012 0533     No results found.     Assessment & Plan:   1. Intermittent claudication (HCC)  Recommend:  The patient has atypical pain symptoms for pure atherosclerotic disease. However, on physical exam there is evidence of mixed venous and arterial disease, given the diminished pulses and the edema associated with venous changes of the legs.  Noninvasive studies including ABI's and venous ultrasound of the legs will be obtained and the patient will follow up with me to review these studies.  I suspect the patient is c/o pseudoclaudication.  Patient should have an evaluation of his LS spine which I defer to the primary service.  The patient should continue walking and begin a more formal exercise program. The patient should continue his antiplatelet therapy and aggressive treatment of the lipid abnormalities.  The patient should begin wearing graduated  compression socks 15-20 mmHg strength to control edema.  - VAS [place] ABI WITH/WO TBI; Future - VAS US AORTA/IVC/ILIACS; Future  2. Essential hypertension Continue antihypertensive medications as already ordered, these medications have been reviewed and there are no changes at this time.   3. Mixed hyperlipidemia Continue statin as ordered and reviewed, no changes at this time    Current Outpatient Medications on File Prior to Visit  Medication Sig Dispense Refill  . amLODipine (NORVASC) 10 MG tablet Take 1 tablet (10 mg total) by mouth daily. 90 tablet 3  . aspirin 81 MG tablet Take 81 mg by mouth daily.    Marland Kitchen buPROPion (WELLBUTRIN XL) 300 MG 24 hr tablet Take 300 mg by mouth daily.    Marland Kitchen ezetimibe (ZETIA) 10 MG tablet TAKE ONE TABLET BY MOUTH DAILY 90 tablet 3  . fluticasone (FLONASE) 50 MCG/ACT nasal spray SPRAY 2 SPRAYS INTO EACH NOSTRIL EVERY DAY 48 mL 0  . metoprolol succinate (TOPROL-XL) 25 MG 24 hr tablet TAKE ONE TABLET BY MOUTH DAILY 90 tablet 0  . Multiple Vitamin (MULTI-VITAMIN DAILY PO) Take by mouth.    . nitroGLYCERIN (NITROSTAT) 0.4 MG SL tablet PLACE 1 TABLET UNDER THE TONGUE EVERY 5 MINUTES AS NEEDED FOR CHEST PAIN. MAXIMUM OF 3 DOSES. 25 tablet 0  . omeprazole (PRILOSEC) 20 MG capsule TAKE ONE CAPSULE BY MOUTH DAILY 90 capsule 2  . ramipril (ALTACE) 10 MG capsule TAKE ONE CAPSULE BY MOUTH DAILY 90 capsule 0  . rosuvastatin (CRESTOR) 5 MG tablet Take 1 tablet (5 mg total) by mouth every other day. 45 tablet 1  . sildenafil (VIAGRA) 50 MG tablet TAKE 1-2 TABLETS BY  MOUTH DAILY AS NEEDED FOR ERECTILE DYSFUNCTION 10 tablet 0   No current facility-administered medications on file prior to visit.    There are no Patient Instructions on file for this visit. No follow-ups on file.   Kris Hartmann, NP

## 2020-07-02 ENCOUNTER — Other Ambulatory Visit: Payer: Self-pay | Admitting: Cardiovascular Disease

## 2020-07-02 ENCOUNTER — Other Ambulatory Visit: Payer: Self-pay | Admitting: Family Medicine

## 2020-07-02 ENCOUNTER — Other Ambulatory Visit: Payer: Self-pay

## 2020-07-02 DIAGNOSIS — E785 Hyperlipidemia, unspecified: Secondary | ICD-10-CM

## 2020-07-15 ENCOUNTER — Ambulatory Visit (INDEPENDENT_AMBULATORY_CARE_PROVIDER_SITE_OTHER): Payer: Medicare Other | Admitting: Podiatry

## 2020-07-15 ENCOUNTER — Ambulatory Visit: Payer: Medicare Other

## 2020-07-15 ENCOUNTER — Other Ambulatory Visit: Payer: Self-pay

## 2020-07-15 ENCOUNTER — Encounter: Payer: Self-pay | Admitting: Podiatry

## 2020-07-15 DIAGNOSIS — I2510 Atherosclerotic heart disease of native coronary artery without angina pectoris: Secondary | ICD-10-CM

## 2020-07-15 DIAGNOSIS — M2041 Other hammer toe(s) (acquired), right foot: Secondary | ICD-10-CM

## 2020-07-15 DIAGNOSIS — M2042 Other hammer toe(s) (acquired), left foot: Secondary | ICD-10-CM

## 2020-07-15 DIAGNOSIS — M79676 Pain in unspecified toe(s): Secondary | ICD-10-CM | POA: Diagnosis not present

## 2020-07-15 DIAGNOSIS — B351 Tinea unguium: Secondary | ICD-10-CM

## 2020-07-15 NOTE — Addendum Note (Signed)
Addended by: Rip Harbour on: 07/15/2020 03:35 PM   Modules accepted: Orders

## 2020-07-15 NOTE — Progress Notes (Signed)
Subjective:  Patient ID: Rodney Delacruz, male    DOB: 1933-09-20,  MRN: [id number] HPI Chief Complaint  Patient presents with  . Toe Pain    PCP referred for foot exam-concerned for circulation problems-scheduled for vascular testing next week, hammertoes  . New Patient (Initial Visit)    Est pt 2019    86 y.o. male presents with the above complaint.   ROS: Denies fever chills nausea vomiting muscle aches pains calf pain back pain chest pain shortness of breath.  Past Medical History:  Diagnosis Date  . Allergy   . Anxiety   . Coronary artery disease   . Depression   . Elevated PSA    Followed by Dr. Rosana Berger every 6 month  . Heart murmur   . Hyperlipidemia   . Hypertension   . MI (myocardial infarction) ([place]) 2009  . Ulcer    Past Surgical History:  Procedure Laterality Date  . CARDIAC CATHETERIZATION  2005, 2009  . CHOLECYSTECTOMY  2001  . CORONARY ANGIOPLASTY  2005   s/p stent placement @ [facility]  . CORONARY ARTERY BYPASS GRAFT  2009   [facility]  . TONSILLECTOMY AND ADENOIDECTOMY  1942    Current Outpatient Medications:  .  amLODipine (NORVASC) 10 MG tablet, Take 1 tablet (10 mg total) by mouth daily., Disp: 90 tablet, Rfl: 3 .  aspirin 81 MG tablet, Take 81 mg by mouth daily., Disp: , Rfl:  .  buPROPion (WELLBUTRIN XL) 300 MG 24 hr tablet, Take 300 mg by mouth daily., Disp: , Rfl:  .  ezetimibe (ZETIA) 10 MG tablet, TAKE ONE TABLET BY MOUTH DAILY, Disp: 90 tablet, Rfl: 3 .  fluticasone (FLONASE) 50 MCG/ACT nasal spray, SPRAY 2 SPRAYS INTO EACH NOSTRIL EVERY DAY, Disp: 48 mL, Rfl: 0 .  metoprolol succinate (TOPROL-XL) 25 MG 24 hr tablet, TAKE ONE TABLET BY MOUTH DAILY, Disp: 90 tablet, Rfl: 0 .  Multiple Vitamin (MULTI-VITAMIN DAILY PO), Take by mouth., Disp: , Rfl:  .  nitroGLYCERIN (NITROSTAT) 0.4 MG SL tablet, PLACE 1 TABLET UNDER THE TONGUE EVERY 5 MINUTES AS NEEDED FOR CHEST PAIN. MAXIMUM OF 3 DOSES., Disp: 25 tablet, Rfl: 0 .  omeprazole (PRILOSEC) 20 MG capsule, TAKE  ONE CAPSULE BY MOUTH DAILY, Disp: 90 capsule, Rfl: 2 .  ramipril (ALTACE) 10 MG capsule, TAKE ONE CAPSULE BY MOUTH DAILY, Disp: 90 capsule, Rfl: 0 .  rosuvastatin (CRESTOR) 5 MG tablet, TAKE ONE TABLET BY MOUTH EVERY OTHER DAY, Disp: 45 tablet, Rfl: 1 .  sildenafil (VIAGRA) 50 MG tablet, TAKE 1-2 TABLETS BY MOUTH DAILY AS NEEDED FOR ERECTILE DYSFUNCTION, Disp: 10 tablet, Rfl: 0  Allergies  Allergen Reactions  . Plavix [Clopidogrel Bisulfate] Rash   Review of Systems Objective:  There were no vitals filed for this visit.  General: Well developed, nourished, in no acute distress, alert and oriented x3   Dermatological: Skin is warm, dry and supple bilateral. Nails x 10 are thick yellow and dystrophic; remaining integument appears unremarkable at this time. There are no open sores, no preulcerative lesions, no rash or signs of infection present.  Vascular: Dorsalis Pedis artery and Posterior Tibial artery pedal pulses are 2/4 bilateral with immedate capillary fill time. Pedal hair growth present. No varicosities and no lower extremity edema present bilateral.   Neruologic: Grossly intact via light touch bilateral. Vibratory intact via tuning fork bilateral. Protective threshold with Semmes Wienstein monofilament intact to all pedal sites bilateral. Patellar and Achilles deep tendon reflexes 2+ bilateral. No Babinski or  clonus noted bilateral.   Musculoskeletal: No gross boney pedal deformities bilateral. No pain, crepitus, or limitation noted with foot and ankle range of motion bilateral. Muscular strength 5/5 in all groups tested bilateral.  Flexible hammertoe deformities are noted bilaterally.  They are completely flat with weightbearing.  Toenails are long and thick testing only tenderness he has.  Gait: Unassisted, Nonantalgic.    Radiographs:  None taken  Assessment & Plan:   Assessment: Does not demonstrate any peripheral vascular disease today though he is going to have a test  done next week.  He still has hair growth to the dorsum of the foot and toes.  Toes are flexible no concern for arthritides at this point.  Painful elongated toenails.    Plan: Debrided nails 1 through 5 bilaterally.  If there is any reason for him to follow-up with [place] after the vascular testing and they will notify us.     Janessa Mickle T. Lutz, [place]

## 2020-07-22 ENCOUNTER — Ambulatory Visit (INDEPENDENT_AMBULATORY_CARE_PROVIDER_SITE_OTHER): Payer: Medicare Other

## 2020-07-22 ENCOUNTER — Other Ambulatory Visit: Payer: Self-pay

## 2020-07-22 ENCOUNTER — Ambulatory Visit (INDEPENDENT_AMBULATORY_CARE_PROVIDER_SITE_OTHER): Payer: Medicare Other | Admitting: Nurse Practitioner

## 2020-07-22 ENCOUNTER — Encounter (INDEPENDENT_AMBULATORY_CARE_PROVIDER_SITE_OTHER): Payer: Self-pay | Admitting: Nurse Practitioner

## 2020-07-22 VITALS — BP 130/61 | HR 66 | Resp 16 | Wt 177.0 lb

## 2020-07-22 DIAGNOSIS — I739 Peripheral vascular disease, unspecified: Secondary | ICD-10-CM | POA: Diagnosis not present

## 2020-07-22 DIAGNOSIS — I10 Essential (primary) hypertension: Secondary | ICD-10-CM

## 2020-07-22 DIAGNOSIS — E782 Mixed hyperlipidemia: Secondary | ICD-10-CM | POA: Diagnosis not present

## 2020-07-22 NOTE — Progress Notes (Signed)
Subjective:    Patient ID: Rodney Delacruz, male    DOB: March 14, 1934, 86 y.o.   MRN: [id number] Chief Complaint  Patient presents with  . Follow-up    ultrasound follow up    The patient is seen for evaluation of painful lower extremities and diminished pulses. Patient notes the pain is always associated with activity and is somewhat consistent day today. Typically, the pain occurs at less than one block, progress is as activity continues to the point that the patient must stop walking. Resting including standing still for several minutes allowed resumption of the activity and the ability to walk a similar distance before stopping again. Uneven terrain and inclined shorten the distance. The pain has been progressive over the past several years.   The patient denies rest pain or dangling of an extremity off the side of the bed during the night for relief. No open wounds or sores at this time. No prior interventions or surgeries.  The patient was previously seen in our office in 2019 for a similar issue.  The patient was found to have noncompressible ABIs with mostly triphasic waveforms.  The patient also notes to noticing more recent back pain.  He notes that after standing for long periods his back begins to ache significantly enough that he has to sit.  The patient denies changes in claudication symptoms or new rest pain symptoms.  No new ulcers or wounds of the foot.  The patient's blood pressure has been stable and relatively well controlled. The patient denies amaurosis fugax or recent TIA symptoms. There are no recent neurological changes noted. The patient denies history of DVT, PE or superficial thrombophlebitis. The patient denies recent episodes of angina or shortness of breath.   Today the patient underwent bilateral ABIs which reveals noncompressible ABIs bilaterally.  This is consistent with the previous study on 10/05/2017.  The patient's right TBI 0.86 with a left TBI 0.96.   These are slightly improved from the previous study in 2019.  The patient has biphasic tibial artery waveforms with good toe waveforms bilaterally.  The patient also underwent an abdominal aortoiliac study which showed no evidence of an abdominal aortic aneurysm visualized.  The largest measurement was 1.7 cm.  The patient has biphasic waveforms in the iliac arteries which is suggestive of very mild atherosclerosis however there is no evidence of significant stenosis.   Review of Systems  Musculoskeletal: Positive for back pain.  All other systems reviewed and are negative.      Objective:   Physical Exam Vitals reviewed.  HENT:     Head: Normocephalic.  Cardiovascular:     Rate and Rhythm: Normal rate.     Pulses:          Dorsalis pedis pulses are detected w/ Doppler on the right side and detected w/ Doppler on the left side.       Posterior tibial pulses are detected w/ Doppler on the right side and detected w/ Doppler on the left side.  Pulmonary:     Effort: Pulmonary effort is normal.  Skin:    General: Skin is warm and dry.  Neurological:     Mental Status: He is alert and oriented to person, place, and time.  Psychiatric:        Mood and Affect: Mood normal.        Behavior: Behavior normal.        Thought Content: Thought content normal.        Judgment:  Judgment normal.     BP 130/61 (BP Location: Right Arm)   Pulse 66   Resp 16   Wt 177 lb (80.3 kg)   BMI 24.01 kg/m   Past Medical History:  Diagnosis Date  . Allergy   . Anxiety   . Coronary artery disease   . Depression   . Elevated PSA    Followed by Dr. Rosana Berger every 6 month  . Heart murmur   . Hyperlipidemia   . Hypertension   . MI (myocardial infarction) ([place]) 2009  . Ulcer     Social History   Socioeconomic History  . Marital status: Married    Spouse name: Olin Hauser  . Number of children: 2  . Years of education: 29  . Highest education level: Not on file  Occupational History  .  Occupation: Information systems manager    Comment: Retired  Tobacco Use  . Smoking status: Former Smoker    Packs/day: 1.00    Years: 20.00    Pack years: 20.00    Types: Cigarettes    Quit date: 08/15/1976    Years since quitting: 43.9  . Smokeless tobacco: Never Used  Substance and Sexual Activity  . Alcohol use: Yes    Alcohol/week: 0.0 standard drinks    Comment: Occasional  . Drug use: No  . Sexual activity: Not on file  Other Topics Concern  . Not on file  Social History Narrative   Mr. Cranshaw grew up in [place], [place]. He currently lives in [place] with his wife, Olin Hauser, of 14 years. He was married previously to his high school sweetheart. They were married for 33 years. He widowed in 1993. They had two son Christia [place] and Montoya). Mr. Mathieson sons live locally [facility] and [place]). He worked at [employer] and retired from [employer]. He was there for 20 years. He worked in Designer, television/film set. He enjoys fishing, reading, watching TV on his spare time. He also enjoys walking and visiting friends.   Social Determinants of Health   Financial Resource Strain:   . Difficulty of Paying Living Expenses: Not on file  Food Insecurity:   . Worried About Charity fundraiser in the Last Year: Not on file  . Ran Out of Food in the Last Year: Not on file  Transportation Needs:   . Lack of Transportation (Medical): Not on file  . Lack of Transportation (Non-Medical): Not on file  Physical Activity:   . Days of Exercise per Week: Not on file  . Minutes of Exercise per Session: Not on file  Stress:   . Feeling of Stress : Not on file  Social Connections:   . Frequency of Communication with Friends and Family: Not on file  . Frequency of Social Gatherings with Friends and Family: Not on file  . Attends Religious Services: Not on file  . Active Member of Clubs or Organizations: Not on file  . Attends Archivist Meetings: Not on file  . Marital Status:  Not on file  Intimate Partner Violence:   . Fear of Current or Ex-Partner: Not on file  . Emotionally Abused: Not on file  . Physically Abused: Not on file  . Sexually Abused: Not on file    Past Surgical History:  Procedure Laterality Date  . CARDIAC CATHETERIZATION  2005, 2009  . CHOLECYSTECTOMY  2001  . CORONARY ANGIOPLASTY  2005   s/p stent placement @ [facility]  . CORONARY ARTERY BYPASS GRAFT  2009   [facility]  . TONSILLECTOMY AND ADENOIDECTOMY  1942    Family History  Problem Relation Age of Onset  . Heart attack Brother   . Heart disease Brother        CAD  . Alcohol abuse Father   . Diabetes Sister   . Stroke Mother   . Alcohol abuse Paternal Grandfather     Allergies  Allergen Reactions  . Plavix [Clopidogrel Bisulfate] Rash    CBC Latest Ref Rng & Units 04/24/2017 10/19/2016 08/03/2016  WBC 4.0 - 10.5 K/uL 8.3 8.7 9.2  Hemoglobin 13.0 - 17.0 g/dL 12.6(L) 12.3(L) 12.4(L)  Hematocrit 39 - 52 % 38.5(L) 37.1(L) 37.6(L)  Platelets 150 - 400 K/uL 265.0 271.0 272.0      CMP     Component Value Date/Time   NA 139 06/23/2020 1522   NA 142 10/02/2012 0533   K 4.5 06/23/2020 1522   K 4.1 10/02/2012 0533   CL 104 06/23/2020 1522   CL 108 (H) 10/02/2012 0533   CO2 27 06/23/2020 1522   CO2 27 10/02/2012 0533   GLUCOSE 84 06/23/2020 1522   GLUCOSE 101 (H) 10/02/2012 0533   BUN 19 06/23/2020 1522   BUN 18 10/02/2012 0533   CREATININE 1.43 06/23/2020 1522   CREATININE 1.17 10/02/2012 0533   CALCIUM 8.6 06/23/2020 1522   CALCIUM 8.7 10/02/2012 0533   PROT 6.5 02/11/2020 0926   PROT 7.8 10/01/2012 1305   ALBUMIN 4.2 02/11/2020 0926   ALBUMIN 3.9 10/01/2012 1305   AST 19 02/11/2020 0926   AST 30 10/01/2012 1305   ALT 19 02/11/2020 0926   ALT 27 10/01/2012 1305   ALKPHOS 77 02/11/2020 0926   ALKPHOS 80 10/01/2012 1305   BILITOT 0.6 02/11/2020 0926   BILITOT 0.5 10/01/2012 1305   GFRNONAA >60 10/02/2012 0533   GFRAA >60 10/02/2012 0533     No results found.      Assessment & Plan:   1. Intermittent claudication (HCC) Recommend:  I do not find evidence of Vascular pathology that would explain the patient's symptoms  The patient has good perfusion overall with no evidence of significant PAD.  The patient has noncompressible waveforms likely account for his diminished pedal pulses.  I do not find evidence of Vascular pathology that would explain the patient's symptoms and I suspect the patient is c/o pseudoclaudication.  Patient should have an evaluation of his LS spine which I defer to the primary service.   The patient should continue walking and begin a more formal exercise program. The patient should continue his antiplatelet therapy and aggressive treatment of the lipid abnormalities. Patient will follow-up with me on a PRN basis  Further work-up of her lower extremity pain is deferred to the primary service     2. Essential hypertension Continue antihypertensive medications as already ordered, these medications have been reviewed and there are no changes at this time.   3. Mixed hyperlipidemia Continue statin as ordered and reviewed, no changes at this time    Current Outpatient Medications on File Prior to Visit  Medication Sig Dispense Refill  . amLODipine (NORVASC) 10 MG tablet Take 1 tablet (10 mg total) by mouth daily. 90 tablet 3  . aspirin 81 MG tablet Take 81 mg by mouth daily.    Marland Kitchen buPROPion (WELLBUTRIN XL) 300 MG 24 hr tablet Take 300 mg by mouth daily.    Marland Kitchen ezetimibe (ZETIA) 10 MG tablet TAKE ONE TABLET BY MOUTH DAILY 90 tablet 3  .  fluticasone (FLONASE) 50 MCG/ACT nasal spray SPRAY 2 SPRAYS INTO EACH NOSTRIL EVERY DAY 48 mL 0  . metoprolol succinate (TOPROL-XL) 25 MG 24 hr tablet TAKE ONE TABLET BY MOUTH DAILY 90 tablet 0  . Multiple Vitamin (MULTI-VITAMIN DAILY PO) Take by mouth.    . nitroGLYCERIN (NITROSTAT) 0.4 MG SL tablet PLACE 1 TABLET UNDER THE TONGUE EVERY 5 MINUTES AS NEEDED FOR CHEST PAIN. MAXIMUM OF 3 DOSES.  25 tablet 0  . omeprazole (PRILOSEC) 20 MG capsule TAKE ONE CAPSULE BY MOUTH DAILY 90 capsule 2  . ramipril (ALTACE) 10 MG capsule TAKE ONE CAPSULE BY MOUTH DAILY 90 capsule 0  . rosuvastatin (CRESTOR) 5 MG tablet TAKE ONE TABLET BY MOUTH EVERY OTHER DAY 45 tablet 1  . sildenafil (VIAGRA) 50 MG tablet TAKE 1-2 TABLETS BY MOUTH DAILY AS NEEDED FOR ERECTILE DYSFUNCTION 10 tablet 0   No current facility-administered medications on file prior to visit.    There are no Patient Instructions on file for this visit. No follow-ups on file.   Kris Hartmann, NP

## 2020-07-28 DIAGNOSIS — F331 Major depressive disorder, recurrent, moderate: Secondary | ICD-10-CM | POA: Diagnosis not present

## 2020-08-20 ENCOUNTER — Other Ambulatory Visit: Payer: Self-pay | Admitting: Family Medicine

## 2020-09-18 ENCOUNTER — Telehealth: Payer: Self-pay

## 2020-09-18 MED ORDER — FLUTICASONE PROPIONATE 50 MCG/ACT NA SUSP
NASAL | 0 refills | Status: DC
Start: 2020-09-18 — End: 2021-02-26

## 2020-09-18 NOTE — Telephone Encounter (Signed)
Pt needs refill on fluticasone (FLONASE) 50 MCG/ACT nasal spray to [facility]

## 2020-10-08 ENCOUNTER — Other Ambulatory Visit: Payer: Self-pay | Admitting: Cardiovascular Disease

## 2020-10-21 DIAGNOSIS — L570 Actinic keratosis: Secondary | ICD-10-CM | POA: Diagnosis not present

## 2020-10-21 DIAGNOSIS — L209 Atopic dermatitis, unspecified: Secondary | ICD-10-CM | POA: Diagnosis not present

## 2020-10-21 DIAGNOSIS — L820 Inflamed seborrheic keratosis: Secondary | ICD-10-CM | POA: Diagnosis not present

## 2020-11-02 ENCOUNTER — Other Ambulatory Visit: Payer: Self-pay | Admitting: Family Medicine

## 2020-11-17 ENCOUNTER — Telehealth: Payer: Self-pay | Admitting: Family Medicine

## 2020-11-17 NOTE — Telephone Encounter (Signed)
Left message for patient to call back and schedule Medicare Annual Wellness Visit (AWV) either virtually or in office. No detailed message left    Last AWV no information please schedule at anytime

## 2020-11-18 NOTE — Telephone Encounter (Signed)
Patient returned [place] call. He stated he did not want to schedule a AWV at this time.

## 2020-11-18 NOTE — Telephone Encounter (Signed)
Documented on spreedsheet

## 2020-11-27 DIAGNOSIS — Z23 Encounter for immunization: Secondary | ICD-10-CM | POA: Diagnosis not present

## 2020-12-21 ENCOUNTER — Other Ambulatory Visit: Payer: Self-pay

## 2020-12-21 ENCOUNTER — Ambulatory Visit (INDEPENDENT_AMBULATORY_CARE_PROVIDER_SITE_OTHER): Payer: Medicare Other | Admitting: Family Medicine

## 2020-12-21 ENCOUNTER — Encounter: Payer: Self-pay | Admitting: Family Medicine

## 2020-12-21 VITALS — BP 110/60 | HR 69 | Temp 98.2°F | Ht 72.0 in | Wt 173.0 lb

## 2020-12-21 DIAGNOSIS — I739 Peripheral vascular disease, unspecified: Secondary | ICD-10-CM

## 2020-12-21 DIAGNOSIS — F419 Anxiety disorder, unspecified: Secondary | ICD-10-CM | POA: Diagnosis not present

## 2020-12-21 DIAGNOSIS — R634 Abnormal weight loss: Secondary | ICD-10-CM | POA: Diagnosis not present

## 2020-12-21 DIAGNOSIS — E782 Mixed hyperlipidemia: Secondary | ICD-10-CM

## 2020-12-21 DIAGNOSIS — R972 Elevated prostate specific antigen [PSA]: Secondary | ICD-10-CM | POA: Diagnosis not present

## 2020-12-21 DIAGNOSIS — I10 Essential (primary) hypertension: Secondary | ICD-10-CM

## 2020-12-21 LAB — CBC WITH DIFFERENTIAL/PLATELET
Basophils Absolute: 0.1 10*3/uL (ref 0.0–0.1)
Basophils Relative: 0.7 % (ref 0.0–3.0)
Eosinophils Absolute: 0.4 10*3/uL (ref 0.0–0.7)
Eosinophils Relative: 4.6 % (ref 0.0–5.0)
HCT: 36.8 % — ABNORMAL LOW (ref 39.0–52.0)
Hemoglobin: 12.7 g/dL — ABNORMAL LOW (ref 13.0–17.0)
Lymphocytes Relative: 14.8 % (ref 12.0–46.0)
Lymphs Abs: 1.2 10*3/uL (ref 0.7–4.0)
MCHC: 34.4 g/dL (ref 30.0–36.0)
MCV: 97.9 fl (ref 78.0–100.0)
Monocytes Absolute: 0.5 10*3/uL (ref 0.1–1.0)
Monocytes Relative: 6.2 % (ref 3.0–12.0)
Neutro Abs: 5.8 10*3/uL (ref 1.4–7.7)
Neutrophils Relative %: 73.7 % (ref 43.0–77.0)
Platelets: 261 10*3/uL (ref 150.0–400.0)
RBC: 3.76 Mil/uL — ABNORMAL LOW (ref 4.22–5.81)
RDW: 13.7 % (ref 11.5–15.5)
WBC: 7.9 10*3/uL (ref 4.0–10.5)

## 2020-12-21 LAB — COMPREHENSIVE METABOLIC PANEL
ALT: 16 U/L (ref 0–53)
AST: 21 U/L (ref 0–37)
Albumin: 4.1 g/dL (ref 3.5–5.2)
Alkaline Phosphatase: 90 U/L (ref 39–117)
BUN: 22 mg/dL (ref 6–23)
CO2: 27 mEq/L (ref 19–32)
Calcium: 9.1 mg/dL (ref 8.4–10.5)
Chloride: 104 mEq/L (ref 96–112)
Creatinine, Ser: 1.48 mg/dL (ref 0.40–1.50)
GFR: 43.06 mL/min — ABNORMAL LOW (ref >60.00)
Glucose, Bld: 84 mg/dL (ref 70–99)
Potassium: 4.9 mEq/L (ref 3.5–5.1)
Sodium: 139 mEq/L (ref 135–145)
Total Bilirubin: 0.7 mg/dL (ref 0.2–1.2)
Total Protein: 6.9 g/dL (ref 6.0–8.3)

## 2020-12-21 LAB — LIPID PANEL
Cholesterol: 174 mg/dL (ref 0–200)
HDL: 53.2 mg/dL (ref >39.00)
LDL Cholesterol: 106 mg/dL — ABNORMAL HIGH (ref 0–99)
NonHDL: 121.01
Total CHOL/HDL Ratio: 3
Triglycerides: 74 mg/dL (ref 0.0–149.0)
VLDL: 14.8 mg/dL (ref 0.0–40.0)

## 2020-12-21 LAB — PSA: PSA: 6.05 ng/mL — ABNORMAL HIGH (ref 0.10–4.00)

## 2020-12-21 LAB — TSH: TSH: 1.99 u[IU]/mL (ref 0.35–4.50)

## 2020-12-21 LAB — SEDIMENTATION RATE: Sed Rate: 21 mm/hr — ABNORMAL HIGH (ref 0–20)

## 2020-12-21 NOTE — Progress Notes (Signed)
Rodney Rumps, MD Phone: [phone]  Rodney Delacruz is a 87 y.o. male who presents today for f/u  HYPERTENSION  Disease Monitoring  Home BP Monitoring 390-300 systolic Chest pain- no    Dyspnea- no Medications  Compliance-  Taking ramipril, amlodipine, metoprolol, does not he will hold his next days medications when his systolic BP gets to 923. Lightheadedness-  no  Edema- no  HYPERLIPIDEMIA Symptoms Chest pain on exertion:  no   Leg claudication:   no Medications: Compliance- taking zetia, stopped crestor due to muscle aches Right upper quadrant pain- no  Muscle aches- none since he stopped crestor  Claudication: Patient notes he previously saw vascular surgery.  They did not find any evidence of a blood flow issue.  They thought he may have pseudoclaudication.  The patient notes no recurrence of the symptoms.  No back pain.  No numbness.  No weakness.  No incontinence.  History of panic attacks: Patient notes he has not had any of these anytime recently.  He has been on Wellbutrin for many years for this.  He wonders about tapering off of this.  Weight loss: Patient notes his weight has trended down over the years.  His appetite is just not what it used to be.  He has 2-3 meals a day.  If he skips breakfast he will have a snack.  No vomiting, diarrhea, night sweats, depression, anxiety, blood in the stool, or blood in his urine.   Social History   Tobacco Use  Smoking Status Former Smoker  . Packs/day: 1.00  . Years: 20.00  . Pack years: 20.00  . Types: Cigarettes  . Quit date: 08/15/1976  . Years since quitting: 44.3  Smokeless Tobacco Never Used    Current Outpatient Medications on File Prior to Visit  Medication Sig Dispense Refill  . amLODipine (NORVASC) 10 MG tablet Take 1 tablet (10 mg total) by mouth daily. 90 tablet 3  . aspirin 81 MG tablet Take 81 mg by mouth daily.    Marland Kitchen ezetimibe (ZETIA) 10 MG tablet TAKE ONE TABLET BY MOUTH DAILY 90 tablet 3  . fluticasone  (FLONASE) 50 MCG/ACT nasal spray SPRAY 2 SPRAYS INTO EACH NOSTRIL EVERY DAY 48 mL 0  . metoprolol succinate (TOPROL-XL) 25 MG 24 hr tablet TAKE ONE TABLET BY MOUTH DAILY 90 tablet 0  . Multiple Vitamin (MULTI-VITAMIN DAILY PO) Take by mouth.    . nitroGLYCERIN (NITROSTAT) 0.4 MG SL tablet PLACE 1 TABLET UNDER THE TONGUE EVERY 5 MINUTES AS NEEDED FOR CHEST PAIN. MAXIMUM OF 3 DOSES. 25 tablet 0  . omeprazole (PRILOSEC) 20 MG capsule TAKE ONE CAPSULE BY MOUTH DAILY 90 capsule 2  . ramipril (ALTACE) 10 MG capsule TAKE ONE CAPSULE BY MOUTH DAILY 90 capsule 0  . rosuvastatin (CRESTOR) 5 MG tablet TAKE ONE TABLET BY MOUTH EVERY OTHER DAY 45 tablet 1  . sildenafil (VIAGRA) 50 MG tablet TAKE 1 TO 2 TABLETS DAILY AS NEEDED FOR ERECTILE DYSFUNCTION 10 tablet 0   No current facility-administered medications on file prior to visit.     ROS see history of present illness  Objective  Physical Exam Vitals:   12/21/20 1007  BP: 110/60  Pulse: 69  Temp: 98.2 F (36.8 C)  SpO2: 94%    BP Readings from Last 3 Encounters:  12/21/20 110/60  07/22/20 130/61  06/30/20 127/68   Wt Readings from Last 3 Encounters:  12/21/20 173 lb (78.5 kg)  07/22/20 177 lb (80.3 kg)  06/30/20 177 lb (  80.3 kg)    Physical Exam Constitutional:      General: He is not in acute distress.    Appearance: He is not diaphoretic.  Cardiovascular:     Rate and Rhythm: Normal rate and regular rhythm.     Heart sounds: Normal heart sounds.  Pulmonary:     Effort: Pulmonary effort is normal.     Breath sounds: Normal breath sounds.  Abdominal:     General: Bowel sounds are normal. There is no distension.     Palpations: Abdomen is soft.     Tenderness: There is no abdominal tenderness. There is no guarding or rebound.  Musculoskeletal:     Right lower leg: No edema.     Left lower leg: No edema.  Lymphadenopathy:     Cervical: No cervical adenopathy.  Skin:    General: Skin is warm and dry.  Neurological:      Mental Status: He is alert.     Comments: 5/5 strength bilateral quads, hamstrings, plantar flexion, and dorsiflexion, sensation to light touch intact bilateral lower extremities      Assessment/Plan: Please see individual problem list.  Problem List Items Addressed This Visit    Mixed hyperlipidemia    Continue Zetia 10 mg once daily.  Check lipid panel today.  We will refer him to chronic care management to get their input on PCSK9 inhibitor.      Relevant Orders   Lipid panel   Comp Met (CMET)   AMB Referral to [facility] Coordinaton   Essential hypertension - Primary    Well-controlled.  We will continue with amlodipine 10 mg once daily, metoprolol 25 mg daily, and ramipril 10 mg daily.  He will monitor his blood pressure if it drops further he will let us know.  If he develops lightheadedness he will also let us know.      Relevant Orders   Comp Met (CMET)   Elevated PSA    Check PSA today given weight loss.      Relevant Orders   PSA   Anxiety    The patient has been asymptomatic with no recent panic attacks.  We discussed stopping the Wellbutrin and seeing how he does.  If he has recurrent anxiety or panic attacks he will let us know.      Intermittent claudication (HCC)    Negative work-up with vascular surgery.  This could have represented neurogenic claudication though he does not have symptoms currently.  He will monitor for recurrence.      Weight loss    Patient has had a slow trend down over the last few years.  I suspect this is related to decreased calorie intake related to decreased appetite.  We will obtain lab work to evaluate for other underlying causes.      Relevant Orders   Comp Met (CMET)   TSH   CBC w/Diff   Sedimentation rate      Return in about 6 weeks (around 02/01/2021) for Weight, anxiety.  This visit occurred during the SARS-CoV-2 public health emergency.  Safety protocols were in place, including screening questions prior to the  visit, additional usage of staff PPE, and extensive cleaning of exam room while observing appropriate contact time as indicated for disinfecting solutions.    Rodney Rumps, MD [place]

## 2020-12-21 NOTE — Patient Instructions (Addendum)
Nice to see you. We will get lab work today. The pharmacist's team should call you to get you scheduled. You can stop the Wellbutrin.  If you have recurrence of anxiety or panic attacks please let us know.

## 2020-12-21 NOTE — Assessment & Plan Note (Signed)
Well-controlled.  We will continue with amlodipine 10 mg once daily, metoprolol 25 mg daily, and ramipril 10 mg daily.  He will monitor his blood pressure if it drops further he will let us know.  If he develops lightheadedness he will also let us know.

## 2020-12-21 NOTE — Assessment & Plan Note (Signed)
Continue Zetia 10 mg once daily.  Check lipid panel today.  We will refer him to chronic care management to get their input on PCSK9 inhibitor.

## 2020-12-21 NOTE — Assessment & Plan Note (Signed)
Check PSA today given weight loss.

## 2020-12-21 NOTE — Assessment & Plan Note (Signed)
Patient has had a slow trend down over the last few years.  I suspect this is related to decreased calorie intake related to decreased appetite.  We will obtain lab work to evaluate for other underlying causes.

## 2020-12-21 NOTE — Assessment & Plan Note (Signed)
Negative work-up with vascular surgery.  This could have represented neurogenic claudication though he does not have symptoms currently.  He will monitor for recurrence.

## 2020-12-21 NOTE — Assessment & Plan Note (Signed)
The patient has been asymptomatic with no recent panic attacks.  We discussed stopping the Wellbutrin and seeing how he does.  If he has recurrent anxiety or panic attacks he will let us know.

## 2020-12-22 ENCOUNTER — Telehealth: Payer: Self-pay

## 2020-12-22 NOTE — Progress Notes (Signed)
Date:  12/23/2020   ID:  Rodney Delacruz, DOB [birth year], MRN [id number]  Patient Location:  [address] [place] [address]   Provider location:   [facility], [place] office  PCP:  [name], MD  Cardiologist:  Arvid Right [facility]  Chief Complaint  Patient presents with  . Follow-up    Annual follow up. Medications verbally reviewed with patient.      History of Present Illness:    Patient has a past medical history of coronary artery disease, prior PCI and bypass surgery in 2008 at [facility],  presenting to [facility] for chest pain on 10/30/2012.  ruled out for MI  Myoview study that showed no ischemia.  GERD.  prior smoking history, stopped in the 70s,  no diabetes He presents today for routine followup of his coronary artery disease  In follow-up today reports that he feels well, walking without any significant chest pain or shortness of breath symptoms  Reports that he quit a statin, was having muscle ache States he is working with primary care, Would like to go on Repatha but only if he gets assistance from the company Scheduled to meet with pharmacy May 24 in several weeks time to see if company assistance can be arranged  Denies any lower extremity edema, no PND orthopnea,  Reports weight relatively stable  Denies any tachypalpitations concerning for arrhythmia  EKG today reviewed with him showing new atrial flutter Ventricular rate 70 bpm  Prior statin myalgias on Crestor, Lipitor After missing his Zetia in the past  Prior CV studies:   The following studies were reviewed today:  Last cardiac catheterization 2009  Stress test February 2014   Past Medical History:  Diagnosis Date  . Allergy   . Anxiety   . Coronary artery disease   . Depression   . Elevated PSA    Followed by Dr. Rosana Berger every 6 month  . Heart murmur   . Hyperlipidemia   . Hypertension   . MI (myocardial infarction) ([place]) 2009  . Ulcer    Past  Surgical History:  Procedure Laterality Date  . CARDIAC CATHETERIZATION  2005, 2009  . CHOLECYSTECTOMY  2001  . CORONARY ANGIOPLASTY  2005   s/p stent placement @ [facility]  . CORONARY ARTERY BYPASS GRAFT  2009   [facility]  . TONSILLECTOMY AND ADENOIDECTOMY  1942     Current Meds  Medication Sig  . amLODipine (NORVASC) 10 MG tablet Take 1 tablet (10 mg total) by mouth daily.  Marland Kitchen aspirin 81 MG tablet Take 81 mg by mouth daily.  Marland Kitchen ezetimibe (ZETIA) 10 MG tablet TAKE ONE TABLET BY MOUTH DAILY  . fluticasone (FLONASE) 50 MCG/ACT nasal spray SPRAY 2 SPRAYS INTO EACH NOSTRIL EVERY DAY  . metoprolol succinate (TOPROL-XL) 25 MG 24 hr tablet TAKE ONE TABLET BY MOUTH DAILY  . Multiple Vitamin (MULTI-VITAMIN DAILY PO) Take by mouth.  . nitroGLYCERIN (NITROSTAT) 0.4 MG SL tablet PLACE 1 TABLET UNDER THE TONGUE EVERY 5 MINUTES AS NEEDED FOR CHEST PAIN. MAXIMUM OF 3 DOSES.  Marland Kitchen omeprazole (PRILOSEC) 20 MG capsule TAKE ONE CAPSULE BY MOUTH DAILY  . ramipril (ALTACE) 10 MG capsule TAKE ONE CAPSULE BY MOUTH DAILY  . sildenafil (VIAGRA) 50 MG tablet TAKE 1 TO 2 TABLETS DAILY AS NEEDED FOR ERECTILE DYSFUNCTION     Allergies:   Plavix [clopidogrel bisulfate]   Social History   Tobacco Use  . Smoking status: Former Smoker    Packs/day: 1.00  Years: 20.00    Pack years: 20.00    Types: Cigarettes    Quit date: 08/15/1976    Years since quitting: 44.3  . Smokeless tobacco: Never Used  Substance Use Topics  . Alcohol use: Yes    Alcohol/week: 0.0 standard drinks    Comment: Occasional  . Drug use: No     Family Hx: The patient's family history includes Alcohol abuse in his father and paternal grandfather; Diabetes in his sister; Heart attack in his brother; Heart disease in his brother; Stroke in his mother.  ROS:   Please see the history of present illness.    Review of Systems  Constitutional: Negative.   HENT: Negative.   Respiratory: Negative.   Cardiovascular: Negative.   Gastrointestinal:  Negative.   Musculoskeletal: Negative.   Neurological: Negative.   Psychiatric/Behavioral: Negative.   All other systems reviewed and are negative.    Labs/Other Tests and Data Reviewed:    Recent Labs: 12/21/2020: ALT 16; BUN 22; Creatinine, Ser 1.48; Hemoglobin 12.7; Platelets 261.0; Potassium 4.9; Sodium 139; TSH 1.99   Recent Lipid Panel Lab Results  Component Value Date/Time   CHOL 174 12/21/2020 10:38 AM   CHOL 148 10/01/2012 01:05 PM   TRIG 74.0 12/21/2020 10:38 AM   TRIG 89 10/01/2012 01:05 PM   HDL 53.20 12/21/2020 10:38 AM   HDL 49 10/01/2012 01:05 PM   CHOLHDL 3 12/21/2020 10:38 AM   LDLCALC 106 (H) 12/21/2020 10:38 AM   LDLCALC 81 10/01/2012 01:05 PM   LDLDIRECT 124.0 04/23/2019 08:28 AM    Wt Readings from Last 3 Encounters:  12/23/20 172 lb (78 kg)  12/21/20 173 lb (78.5 kg)  07/22/20 177 lb (80.3 kg)     Exam:    BP 128/70 (BP Location: Left Arm, Patient Position: Sitting, Cuff Size: Normal)   Pulse 70   Ht 6' (1.829 m)   Wt 172 lb (78 kg)   SpO2 98%   BMI 23.33 kg/m  Constitutional:  oriented to person, place, and time. No distress.  HENT:  Head: Grossly normal Eyes:  no discharge. No scleral icterus.  Neck: No JVD, no carotid bruits  Cardiovascular: Regular rate and rhythm, no murmurs appreciated Pulmonary/Chest: Clear to auscultation bilaterally, no wheezes or rails Abdominal: Soft.  no distension.  no tenderness.  Musculoskeletal: Normal range of motion Neurological:  normal muscle tone. Coordination normal. No atrophy Skin: Skin warm and dry Psychiatric: normal affect, pleasant   ASSESSMENT & PLAN:    Atrial flutter New finding, asymptomatic Long discussion with him concerning various treatment options including rate or rhythm control Recommend he start Eliquis 5 twice daily Is not particularly inclined to lean towards cardioversion but will call us if he develops symptoms  Atherosclerosis of native coronary artery of native heart with  stable angina pectoris ([place]) Currently with no symptoms of angina. No further workup at this time. Continue current medication regimen. Cholesterol above goal, he is can work with pharmacy to see if a PCSK9 inhibitor is affordable  Essential hypertension Blood pressure is well controlled on today's visit. No changes made to the medications.  S/P CABG (coronary artery bypass graft) No further testing at this time Recommend continued exercise program  Long discussion concerning new atrial flutter finding  Total encounter time more than 35 minutes  Greater than 50% was spent in counseling and coordination of care with the patient     Signed, Ida Rogue, MD  12/23/2020 10:37 AM    [facility] [address], [place], [place] [address]

## 2020-12-22 NOTE — Chronic Care Management (AMB) (Signed)
  Chronic Care Management   Note  12/22/2020 Name: Rodney Delacruz MRN: [id number] DOB: Dec 15, 1933  Rodney Delacruz is a 87 y.o. year old male who is a primary care patient of Caryl Bis, Angela Adam, MD. I reached out to [name] by phone today in response to a referral sent by Rodney Delacruz, Rodney Haven, MD     Rodney Delacruz was given information about Chronic Care Management services today including:  1. CCM service includes personalized support from designated clinical staff supervised by his physician, including individualized plan of care and coordination with other care providers 2. 24/7 contact phone numbers for assistance for urgent and routine care needs. 3. Service will only be billed when office clinical staff spend 20 minutes or more in a month to coordinate care. 4. Only one practitioner may furnish and bill the service in a calendar month. 5. The patient may stop CCM services at any time (effective at the end of the month) by phone call to the office staff. 6. The patient will be responsible for cost sharing (co-pay) of up to 20% of the service fee (after annual deductible is met).  Patient agreed to services and verbal consent obtained.   Follow up plan: Telephone appointment with care management team member scheduled for:01/05/2021  Rodney Delacruz, Rodney Delacruz, Rodney Delacruz, Rodney Delacruz Direct Dial: [phone] [email or web address] Website: [email or web address]

## 2020-12-23 ENCOUNTER — Other Ambulatory Visit: Payer: Self-pay

## 2020-12-23 ENCOUNTER — Ambulatory Visit (INDEPENDENT_AMBULATORY_CARE_PROVIDER_SITE_OTHER): Payer: Medicare Other | Admitting: Cardiovascular Disease

## 2020-12-23 ENCOUNTER — Encounter: Payer: Self-pay | Admitting: Cardiovascular Disease

## 2020-12-23 VITALS — BP 128/70 | HR 70 | Ht 72.0 in | Wt 172.0 lb

## 2020-12-23 DIAGNOSIS — E782 Mixed hyperlipidemia: Secondary | ICD-10-CM | POA: Diagnosis not present

## 2020-12-23 DIAGNOSIS — Z951 Presence of aortocoronary bypass graft: Secondary | ICD-10-CM

## 2020-12-23 DIAGNOSIS — I10 Essential (primary) hypertension: Secondary | ICD-10-CM

## 2020-12-23 DIAGNOSIS — I25118 Atherosclerotic heart disease of native coronary artery with other forms of angina pectoris: Secondary | ICD-10-CM | POA: Diagnosis not present

## 2020-12-23 DIAGNOSIS — I483 Typical atrial flutter: Secondary | ICD-10-CM

## 2020-12-23 MED ORDER — APIXABAN 5 MG PO TABS: 5.0000 mg | ORAL_TABLET | Freq: Two times a day (BID) | ORAL | 6 refills | Status: DC

## 2020-12-23 NOTE — Patient Instructions (Addendum)
Read about cardioversion for atrial flutter  Medication Instructions:  STOP aspirin  START  eliquis 5 mg twice a day  2  boxes of samples  Lot: [id number]  Exp: 12/2022  Please fill out the patient assistance application if you need assistance with paying for medication  Try to register for the $10 co-pay card  If you need a refill on your cardiac medications before your next appointment, please call your pharmacy.   Lab work: No new labs needed  Testing/Procedures: Echo for new atrial flutter, hx of CAD  (You will see you results in your MyChart the same time the doctor receives the results, they need time to review results and make recommendations, you can expect a call a few days after your test)  Your physician has requested that you have an echocardiogram. Echocardiography is a painless test that uses sound waves to create images of your heart. It provides your doctor with information about the size and shape of your heart and how well your heart's chambers and valves are working. This procedure takes approximately one hour. There are no restrictions for this procedure.  There is a possibility that an IV may need to be started during your test to inject an image enhancing agent. This is done to obtain more optimal pictures of your heart. Therefore we ask that you do at least drink some water prior to coming in to hydrate your veins.    Follow-Up:  . You will need a follow up appointment in 3 months  . Providers on your designated Care Team:   . Murray Hodgkins, NP . Christell Faith, PA-C . Marrianne Mood, PA-C  COVID-19 Vaccine Information can be found at: ShippingScam.co.uk For questions related to vaccine distribution or appointments, please email vaccine@[email or web address] or call [phone].    Electrical Cardioversion Electrical cardioversion is the delivery of a jolt of electricity to restore a normal rhythm  to the heart. A rhythm that is too fast or is not regular keeps the heart from pumping well. In this procedure, sticky patches or metal paddles are placed on the chest to deliver electricity to the heart from a device. This procedure may be done in an emergency if:  There is low or no blood pressure as a result of the heart rhythm.  Normal rhythm must be restored as fast as possible to protect the brain and heart from further damage.  It may save a life. This may also be a scheduled procedure for irregular or fast heart rhythms that are not immediately life-threatening. Tell a health care provider about:  Any allergies you have.  All medicines you are taking, including vitamins, herbs, eye drops, creams, and over-the-counter medicines.  Any problems you or family members have had with anesthetic medicines.  Any blood disorders you have.  Any surgeries you have had.  Any medical conditions you have.  Whether you are pregnant or may be pregnant. What are the risks? Generally, this is a safe procedure. However, problems may occur, including:  Allergic reactions to medicines.  A blood clot that breaks free and travels to other parts of your body.  The possible return of an abnormal heart rhythm within hours or days after the procedure.  Your heart stopping (cardiac arrest). This is rare. What happens before the procedure? Medicines  Your health care provider may have you start taking: ? Blood-thinning medicines (anticoagulants) so your blood does not clot as easily. ? Medicines to help stabilize your heart rate and rhythm.  Ask  your health care provider about: ? Changing or stopping your regular medicines. This is especially important if you are taking diabetes medicines or blood thinners. ? Taking medicines such as aspirin and ibuprofen. These medicines can thin your blood. Do not take these medicines unless your health care provider tells you to take them. ? Taking  over-the-counter medicines, vitamins, herbs, and supplements. General instructions  Follow instructions from your health care provider about eating or drinking restrictions.  Plan to have someone take you home from the hospital or clinic.  If you will be going home right after the procedure, plan to have someone with you for 24 hours.  Ask your health care provider what steps will be taken to help prevent infection. These may include washing your skin with a germ-killing soap. What happens during the procedure?  An IV will be inserted into one of your veins.  Sticky patches (electrodes) or metal paddles may be placed on your chest.  You will be given a medicine to help you relax (sedative).  An electrical shock will be delivered. The procedure may vary among health care providers and hospitals.   What can I expect after the procedure?  Your blood pressure, heart rate, breathing rate, and blood oxygen level will be monitored until you leave the hospital or clinic.  Your heart rhythm will be watched to make sure it does not change.  You may have some redness on the skin where the shocks were given. Follow these instructions at home:  Do not drive for 24 hours if you were given a sedative during your procedure.  Take over-the-counter and prescription medicines only as told by your health care provider.  Ask your health care provider how to check your pulse. Check it often.  Rest for 48 hours after the procedure or as told by your health care provider.  Avoid or limit your caffeine use as told by your health care provider.  Keep all follow-up visits as told by your health care provider. This is important. Contact a health care provider if:  You feel like your heart is beating too quickly or your pulse is not regular.  You have a serious muscle cramp that does not go away. Get help right away if:  You have discomfort in your chest.  You are dizzy or you feel faint.  You  have trouble breathing or you are short of breath.  Your speech is slurred.  You have trouble moving an arm or leg on one side of your body.  Your fingers or toes turn cold or blue. Summary  Electrical cardioversion is the delivery of a jolt of electricity to restore a normal rhythm to the heart.  This procedure may be done right away in an emergency or may be a scheduled procedure if the condition is not an emergency.  Generally, this is a safe procedure.  After the procedure, check your pulse often as told by your health care provider. This information is not intended to replace advice given to you by your health care provider. Make sure you discuss any questions you have with your health care provider. Document Revised: 03/04/2019 Document Reviewed: 03/04/2019 Elsevier Patient Education  [place].

## 2020-12-24 ENCOUNTER — Telehealth: Payer: Self-pay

## 2020-12-28 NOTE — Telephone Encounter (Signed)
Lmtcb.05/16 ng

## 2020-12-28 NOTE — Telephone Encounter (Signed)
Noted.  Can we see if we can request his last PSA result from them?  You may be able to call them and have them verbally give you the last PSA level.  Thanks.

## 2020-12-28 NOTE — Telephone Encounter (Signed)
LVM at [facility] for the to call back for the patients last PSA results.  [phone]

## 2020-12-31 ENCOUNTER — Other Ambulatory Visit: Payer: Self-pay | Admitting: Cardiovascular Disease

## 2020-12-31 ENCOUNTER — Other Ambulatory Visit: Payer: Self-pay | Admitting: Family Medicine

## 2021-01-01 ENCOUNTER — Telehealth: Payer: Self-pay

## 2021-01-01 NOTE — Telephone Encounter (Signed)
I called and spoke with a person at [facility] and they will fax my the patients las PSA results.  Ledford Goodson,cma

## 2021-01-03 NOTE — Telephone Encounter (Signed)
Please let the patient know that if he is to take the viagra he can not take the nitroglycerin within 48 hours of the viagra. Also if he ever is to take the viagra and needs to call EMS or go to the ED he needs to let them kow that he has taken the viagra. Please also find out if he ever had to use the nitroglycerin.

## 2021-01-04 NOTE — Telephone Encounter (Signed)
Patient stated he has not taken Nitroglycerin, he has it on hand for when he needs it. He stated he has already been informed of the below.

## 2021-01-05 ENCOUNTER — Ambulatory Visit (INDEPENDENT_AMBULATORY_CARE_PROVIDER_SITE_OTHER): Payer: Medicare Other | Admitting: Pharmacist

## 2021-01-05 DIAGNOSIS — I2510 Atherosclerotic heart disease of native coronary artery without angina pectoris: Secondary | ICD-10-CM

## 2021-01-05 DIAGNOSIS — I4892 Unspecified atrial flutter: Secondary | ICD-10-CM

## 2021-01-05 DIAGNOSIS — E782 Mixed hyperlipidemia: Secondary | ICD-10-CM | POA: Diagnosis not present

## 2021-01-05 DIAGNOSIS — I10 Essential (primary) hypertension: Secondary | ICD-10-CM

## 2021-01-05 MED ORDER — PRALUENT 75 MG/ML ~~LOC~~ SOAJ: 75.0000 mg | SUBCUTANEOUS | 2 refills | Status: DC

## 2021-01-05 NOTE — Patient Instructions (Addendum)
Rodney Delacruz,   It was great talking to you today!  We are going to start Praluent 75 mg every 14 days. This is the version of Repatha that your insurance prefers. Call the [facility] at [phone] to enroll in the High Cholesterol fund for Praluent. Your copay on your insurance is [phone] - likely because of that deductible you need to meet first.   Have them fax the funding approval to our office at [phone] attention Catie. They will mail you information to take to the pharmacy for them to use in combination with your insurance to bring the copay to $0. Call me if any issues with that.   Eliquis patient assistance has an income cut off of $54,930. Then, they require you to spend 3% of your total yearly household income on prescription copays. This can be your copays and your wife's copays. Check on your total yearly income and look at if you think you would come anywhere close to 3% of your income on your copay total.   Call me with any questions or concerns!  Catie Darnelle Maffucci, PharmD [phone]  Visit Information   PATIENT GOALS:  Goals Addressed              This Visit's Progress     Patient Stated   .  Medication Management (pt-stated)         Patient Goals/Self-Care Activities . Over the next 90 days, patient will:  - take medications as prescribed collaborate with provider on medication access solutions       Consent to CCM Services: Mr. Brister was given information about Chronic Care Management services today including:  1. CCM service includes personalized support from designated clinical staff supervised by his physician, including individualized plan of care and coordination with other care providers 2. 24/7 contact phone numbers for assistance for urgent and routine care needs. 3. Service will only be billed when office clinical staff spend 20 minutes or more in a month to coordinate care. 4. Only one practitioner may furnish and bill the service in a  calendar month. 5. The patient may stop CCM services at any time (effective at the end of the month) by phone call to the office staff. 6. The patient will be responsible for cost sharing (co-pay) of up to 20% of the service fee (after annual deductible is met).  Patient agreed to services and verbal consent obtained.   Patient verbalizes understanding of instructions provided today and agrees to view in [place].   Plan: Telephone follow up appointment with care management team member scheduled for:  pending medication start  Catie Darnelle Maffucci, PharmD, [place], CPP Clinical Pharmacist [place] at [facility] [place]: Patient Care Plan: Medication Management    Problem Identified: HLD, Atrial Flutter, CAD     Long-Range Goal: Disease Progression Prevention   Start Date: 01/05/2021  This Visit's Progress: On track  Priority: High  Note:   Current Barriers:  . Unable to achieve control of cholesterol  . Unable to afford medication regimen  Pharmacist Clinical Goal(s):  Marland Kitchen Over the next 90 days, patient will achieve control of cholesterol through collaboration with PharmD and provider.   Interventions: . 1:1 collaboration with [name], MD regarding development and update of comprehensive plan of care as evidenced by provider attestation and co-signature . Inter-disciplinary care team collaboration (see longitudinal plan of care) . Comprehensive medication review performed; medication list updated in electronic medical record  Hypertension, new onset Atrial  Flutter: . Appropriately managed; current treatment: rate control: metoprolol succinate 25 mg daily; anticoagulant: Eliquis 5 mg BID  . Additional antihypertensives: ramipril 10 mg daily, amlodipine 10 mg daily - though reports he takes this about every 2-3 days "when BP is high" . Will evaluate appropriate BP control moving forward.  . Notes that he started Eliquis 5 mg BID with  the free 30 day starter coupon. We discussed that the long term $10 coupon will not work as he has Medicare Part D insurance. Discussed Eliquis patient assistance. Patient thinks they are near the cut off for income, and is unsure of total out of pocket spend at this time. He will look into this. . Recommend to continue current regimen at this time   Hyperlipidemia, CAD s/p revascularization: . Uncontrolled; current treatment: ezetimibe 10 mg daily . Medications previously tried: atorvastatin, rosuvastatin, pravastatin, lovastatin - all resulted in significant muscle aches and pains . Discussed role of PCSK9i for secondary prevention. Patient amenable to injection. Started PA process for Repatha, though appears Praluent is preferred and PA was not required. Start Praluent 75 mg Q14 days. Script sent to pharmacy. Copay $478/28 day, though this includes his deductible that he has not paid into. . Discussed cost, as patient has a deductible Part D plan. Discussed [employer] for hyperlipidemia. Patient will call [employer] to enroll in Hyperlipidemia grant for Praluent assistance. He will call me if any questions or concerns and we can complete a 3 way call at that time to enroll. . Recommend LFTs, lipid panel in 4-12 weeks  GERD: . Appropriately managed; current regimen: omeprazole 20 mg daily . Continue current regimen at this time  Patient Goals/Self-Care Activities . Over the next 90 days, patient will:  - take medications as prescribed collaborate with provider on medication access solutions  Follow Up Plan: Telephone follow up appointment with care management team member scheduled for: ~ 6 weeks

## 2021-01-05 NOTE — Chronic Care Management (AMB) (Signed)
Chronic Care Management Pharmacy Note  01/05/2021 Name:  Rodney Delacruz MRN:  [id number] DOB:  February 22, 1934  Subjective: Rodney Delacruz is an 87 y.o. year old male who is a primary patient of Caryl Bis, Angela Adam, MD.  The CCM team was consulted for assistance with disease management and care coordination needs.    Engaged with patient by telephone for initial visit in response to provider referral for pharmacy case management and/or care coordination services.   Consent to Services:  The patient was given information about Chronic Care Management services, agreed to services, and gave verbal consent prior to initiation of services.  Please see initial visit note for detailed documentation.   Patient Care Team: [name], MD as PCP - General (Family Medicine) Minna Merritts, MD as Consulting Physician (Cardiology) De Hollingshead, RPH-CPP (Pharmacist)  Recent office visits:  5/9 - PCP - d/c bupropion d/t stability; f/u urology d/t PSA, LDL 106 on ezetimibe therapy  Recent consult visits:  5/11 - cardiology [place], [place] if affordable, new onset atrial flutter, start Eliquis 5 mg BID  Hospital visits: None in previous 6 months  Objective:  Lab Results  Component Value Date   CREATININE 1.48 12/21/2020   CREATININE 1.43 06/23/2020   CREATININE 1.27 12/24/2019    Lab Results  Component Value Date   HGBA1C 5.8 12/24/2019   Last diabetic Eye exam: No results found for: HMDIABEYEEXA  Last diabetic Foot exam: No results found for: HMDIABFOOTEX      Component Value Date/Time   CHOL 174 12/21/2020 1038   CHOL 148 10/01/2012 1305   TRIG 74.0 12/21/2020 1038   TRIG 89 10/01/2012 1305   HDL 53.20 12/21/2020 1038   HDL 49 10/01/2012 1305   CHOLHDL 3 12/21/2020 1038   VLDL 14.8 12/21/2020 1038   VLDL 18 10/01/2012 1305   LDLCALC 106 (H) 12/21/2020 1038   LDLCALC 81 10/01/2012 1305   LDLDIRECT 124.0 04/23/2019 0828    Hepatic Function Latest Ref Rng &  Units 12/21/2020 02/11/2020 12/24/2019  Total Protein 6.0 - 8.3 g/dL 6.9 6.5 6.5  Albumin 3.5 - 5.2 g/dL 4.1 4.2 4.0  AST 0 - 37 U/L 21 19 16   ALT 0 - 53 U/L 16 19 14   Alk Phosphatase 39 - 117 U/L 90 77 76  Total Bilirubin 0.2 - 1.2 mg/dL 0.7 0.6 0.6  Bilirubin, Direct 0.0 - 0.3 mg/dL - 0.1 -    Lab Results  Component Value Date/Time   TSH 1.99 12/21/2020 10:38 AM    CBC Latest Ref Rng & Units 12/21/2020 04/24/2017 10/19/2016  WBC 4.0 - 10.5 K/uL 7.9 8.3 8.7  Hemoglobin 13.0 - 17.0 g/dL 12.7(L) 12.6(L) 12.3(L)  Hematocrit 39.0 - 52.0 % 36.8(L) 38.5(L) 37.1(L)  Platelets 150.0 - 400.0 K/uL 261.0 265.0 271.0    Lab Results  Component Value Date/Time   VD25OH 30.44 01/19/2015 08:50 AM   VD25OH 39 10/21/2013 09:11 AM    Clinical ASCVD: Yes    CHA2DS2-VASc Score = 4  This indicates a 4.8% annual risk of stroke. The patient's score is based upon: CHF History: No HTN History: Yes Diabetes History: No Stroke History: No Vascular Disease History: Yes Age Score: 2 Gender Score: 0    Social History   Tobacco Use  Smoking Status Former Smoker  . Packs/day: 1.00  . Years: 20.00  . Pack years: 20.00  . Types: Cigarettes  . Quit date: 08/15/1976  . Years since quitting: 44.4  Smokeless Tobacco  Never Used   BP Readings from Last 3 Encounters:  12/23/20 128/70  12/21/20 110/60  07/22/20 130/61   Pulse Readings from Last 3 Encounters:  12/23/20 70  12/21/20 69  07/22/20 66   Wt Readings from Last 3 Encounters:  12/23/20 172 lb (78 kg)  12/21/20 173 lb (78.5 kg)  07/22/20 177 lb (80.3 kg)    Assessment: Review of patient past medical history, allergies, medications, health status, including review of consultants reports, laboratory and other test data, was performed as part of comprehensive evaluation and provision of chronic care management services.   SDOH:  (Social Determinants of Health) assessments and interventions performed:  SDOH Interventions   Flowsheet Row Most  Recent Value  SDOH Interventions   Financial Strain Interventions Other (Comment)  [[facility]]      CCM Care Plan  Allergies  Allergen Reactions  . Plavix [Clopidogrel Bisulfate] Rash    Medications Reviewed Today    Reviewed by De Hollingshead, RPH-CPP (Pharmacist) on 01/05/21 at 1503  Med List Status: <None>  Medication Order Taking? Sig Documenting Provider Last Dose Status Informant  amLODipine (NORVASC) 10 MG tablet [id number] Yes Take 1 tablet (10 mg total) by mouth daily. Minna Merritts, MD Taking Active            Med Note De Hollingshead   Tue Jan 05, 2021  3:00 PM) Taking 2-3 times weekly  apixaban (ELIQUIS) 5 MG TABS tablet [id number] Yes Take 1 tablet (5 mg total) by mouth 2 (two) times daily. Minna Merritts, MD Taking Active   ezetimibe (ZETIA) 10 MG tablet [id number] Yes TAKE ONE TABLET BY MOUTH DAILY Rockey Situ, Kathlene November, MD Taking Active   fluticasone (FLONASE) 50 MCG/ACT nasal spray [id number] Yes SPRAY 2 SPRAYS INTO EACH NOSTRIL EVERY DAY [name], MD Taking Active   metoprolol succinate (TOPROL-XL) 25 MG 24 hr tablet [id number] Yes TAKE ONE TABLET BY MOUTH DAILY Rockey Situ Kathlene November, MD Taking Active   Multiple Vitamin (MULTI-VITAMIN DAILY PO) [id number] Yes Take by mouth. [provider] Taking Active   nitroGLYCERIN (NITROSTAT) 0.4 MG SL tablet [id number] No PLACE 1 TABLET UNDER THE TONGUE EVERY 5 MINUTES AS NEEDED FOR CHEST PAIN. MAXIMUM OF 3 DOSES.  Patient not taking: Reported on 01/05/2021   Minna Merritts, MD Not Taking Active   omeprazole (PRILOSEC) 20 MG capsule [id number] Yes TAKE ONE CAPSULE BY MOUTH DAILY Rockey Situ, Kathlene November, MD Taking Active   ramipril (ALTACE) 10 MG capsule [id number] Yes TAKE ONE CAPSULE BY MOUTH DAILY Rockey Situ Kathlene November, MD Taking Active   sildenafil (VIAGRA) 50 MG tablet [id number] Yes TAKE 1-2 TABLETS BY MOUTH DAILY AS NEEDED FOR ERECTILE DYSFUNCTION [name], MD Taking Active            Patient Active Problem List   Diagnosis Date Noted  . Weight loss 12/21/2020  . Hammer toes of both feet 06/23/2020  . Intermittent claudication ([place]) 06/23/2020  . Kidney dysfunction 12/18/2019  . Anxiety 03/20/2019  . GERD (gastroesophageal reflux disease) 03/19/2018  . Left foot pain 09/19/2017  . Decreased pedal pulses 09/19/2017  . Prediabetes 09/19/2017  . Erectile dysfunction 04/24/2017  . Elevated PSA 07/20/2016  . Gout 07/27/2015  . Personal history of other malignant neoplasm of skin 07/16/2013  . CAD (coronary artery disease) 10/11/2012  . Mixed hyperlipidemia 10/11/2012  . Essential hypertension 10/11/2012  . S/P CABG (coronary artery bypass graft) 10/11/2012    Immunization History  Administered Date(s) Administered  .  Fluad Quad(high Dose 65+) 05/11/2020  . Influenza Split 07/13/2013, 06/02/2014  . Influenza, High Dose Seasonal PF 04/24/2017  . Influenza-Unspecified 06/29/2016, 06/15/2018  . PFIZER(Purple Top)SARS-COV-2 Vaccination 08/26/2019, 09/16/2019, 05/11/2020  . Pneumococcal Conjugate-13 10/21/2013  . Pneumococcal Polysaccharide-23 08/15/2008  . Td 08/15/2008  . Tdap 09/19/2018, 10/25/2018  . Zoster 08/15/2008    Conditions to be addressed/monitored: Atrial Fibrillation, CAD and HLD  Care Plan : Medication Management  Updates made by De Hollingshead, RPH-CPP since 01/05/2021 12:00 AM    Problem: HLD, Atrial Flutter, CAD     Long-Range Goal: Disease Progression Prevention   Start Date: 01/05/2021  This Visit's Progress: On track  Priority: High  Note:   Current Barriers:  . Unable to achieve control of cholesterol  . Unable to afford medication regimen  Pharmacist Clinical Goal(s):  Marland Kitchen Over the next 90 days, patient will achieve control of cholesterol through collaboration with PharmD and provider.   Interventions: . 1:1 collaboration with [name], MD regarding development and update of comprehensive plan of care as  evidenced by provider attestation and co-signature . Inter-disciplinary care team collaboration (see longitudinal plan of care) . Comprehensive medication review performed; medication list updated in electronic medical record  Hypertension, new onset Atrial Flutter: . Appropriately managed; current treatment: rate control: metoprolol succinate 25 mg daily; anticoagulant: Eliquis 5 mg BID  . Additional antihypertensives: ramipril 10 mg daily, amlodipine 10 mg daily - though reports he takes this about every 2-3 days "when BP is high" . Will evaluate appropriate BP control moving forward.  . Notes that he started Eliquis 5 mg BID with the free 30 day starter coupon. We discussed that the long term $10 coupon will not work as he has Medicare Part D insurance. Discussed Eliquis patient assistance. Patient thinks they are near the cut off for income, and is unsure of total out of pocket spend at this time. He will look into this. . Recommend to continue current regimen at this time   Hyperlipidemia, CAD s/p revascularization: . Uncontrolled; current treatment: ezetimibe 10 mg daily . Medications previously tried: atorvastatin, rosuvastatin, pravastatin, lovastatin - all resulted in significant muscle aches and pains . Discussed role of PCSK9i for secondary prevention. Patient amenable to injection. Started PA process for Repatha, though appears Praluent is preferred and PA was not required. Start Praluent 75 mg Q14 days. Script sent to pharmacy. Copay $478/28 day, though this includes his deductible that he has not paid into. . Discussed cost, as patient has a deductible Part D plan. Discussed [employer] for hyperlipidemia. Patient will call [employer] to enroll in Hyperlipidemia grant for Praluent assistance. He will call me if any questions or concerns and we can complete a 3 way call at that time to enroll. . Recommend LFTs, lipid panel in 4-12 weeks  GERD: . Appropriately  managed; current regimen: omeprazole 20 mg daily . Continue current regimen at this time  Patient Goals/Self-Care Activities . Over the next 90 days, patient will:  - take medications as prescribed collaborate with provider on medication access solutions  Follow Up Plan: Telephone follow up appointment with care management team member scheduled for: ~ 6 weeks      Medication Assistance: Discussed [place]  Patient's preferred pharmacy is:  [facility], [address] [place] [address] Phone: [phone] Fax: [phone]   Follow Up:  Patient agrees to Care Plan and Follow-up.  Plan: Telephone  follow up appointment with care management team member scheduled for:  pending medication start  Catie Darnelle Maffucci, PharmD, [place], [place] Clinical Pharmacist [employer] at [facility] [phone]

## 2021-01-06 NOTE — Telephone Encounter (Signed)
I called and spoke with the patient and informed him that the provider stated their is no need to follow up on his PSA and he understood.  Wateen Varon,cma

## 2021-01-06 NOTE — Telephone Encounter (Signed)
Please let the patient know that his psa from 2017 is exactly the same as his current PSA. This is likely the baseline for him given his age. No need for further follow-up at this time.

## 2021-01-20 DIAGNOSIS — L570 Actinic keratosis: Secondary | ICD-10-CM | POA: Diagnosis not present

## 2021-01-25 ENCOUNTER — Other Ambulatory Visit: Payer: Self-pay

## 2021-01-25 ENCOUNTER — Ambulatory Visit (INDEPENDENT_AMBULATORY_CARE_PROVIDER_SITE_OTHER): Payer: Medicare Other

## 2021-01-25 DIAGNOSIS — I483 Typical atrial flutter: Secondary | ICD-10-CM

## 2021-01-25 DIAGNOSIS — I25118 Atherosclerotic heart disease of native coronary artery with other forms of angina pectoris: Secondary | ICD-10-CM

## 2021-01-25 LAB — ECHOCARDIOGRAM COMPLETE
AV Vena cont: 0.5 cm
Area-P 1/2: 5.54 cm2
Calc EF: 71.1 %
P 1/2 time: 614 msec
S' Lateral: 2.4 cm
Single Plane A2C EF: 65.3 %
Single Plane A4C EF: 75.7 %

## 2021-01-28 ENCOUNTER — Other Ambulatory Visit: Payer: Medicare Other

## 2021-02-02 ENCOUNTER — Telehealth: Payer: Self-pay | Admitting: *Deleted

## 2021-02-02 NOTE — Telephone Encounter (Signed)
-----   Message from Minna Merritts, MD sent at 01/31/2021  2:19 PM EDT ----- Echocardiogram Normal cardiac function right and left ventricle, no significant valve disease

## 2021-02-02 NOTE — Telephone Encounter (Signed)
Spoke with patient and reviewed his echo results. He verbalized understanding but stated he is having some swelling to his lower legs and is concerned about this. He also mentioned that he is interested in having cardioversion if his atrial fibrillation persists. Advised that I would send this over to Dr. Rockey Situ for his review and his nurse would be in touch with any recommendations. Instructed him to continue the Eliquis and to not miss any doses. He verbalized understanding of our conversation with no further questions at this time.

## 2021-02-03 ENCOUNTER — Ambulatory Visit: Payer: Medicare Other | Admitting: Family Medicine

## 2021-02-11 ENCOUNTER — Ambulatory Visit: Payer: Medicare Other | Admitting: Family Medicine

## 2021-02-11 NOTE — Telephone Encounter (Signed)
Pt schedule with Christell Faith, PA-C tomorrow 02/12/21 at 09:30am

## 2021-02-12 ENCOUNTER — Encounter: Payer: Self-pay | Admitting: Physician Assistant

## 2021-02-12 ENCOUNTER — Ambulatory Visit (INDEPENDENT_AMBULATORY_CARE_PROVIDER_SITE_OTHER): Payer: Medicare Other | Admitting: Physician Assistant

## 2021-02-12 ENCOUNTER — Ambulatory Visit (INDEPENDENT_AMBULATORY_CARE_PROVIDER_SITE_OTHER): Payer: Medicare Other | Admitting: Family Medicine

## 2021-02-12 ENCOUNTER — Other Ambulatory Visit: Payer: Self-pay

## 2021-02-12 VITALS — BP 140/60 | HR 73 | Ht 72.0 in | Wt 176.1 lb

## 2021-02-12 DIAGNOSIS — Z951 Presence of aortocoronary bypass graft: Secondary | ICD-10-CM

## 2021-02-12 DIAGNOSIS — R634 Abnormal weight loss: Secondary | ICD-10-CM

## 2021-02-12 DIAGNOSIS — I2510 Atherosclerotic heart disease of native coronary artery without angina pectoris: Secondary | ICD-10-CM

## 2021-02-12 DIAGNOSIS — M7989 Other specified soft tissue disorders: Secondary | ICD-10-CM

## 2021-02-12 DIAGNOSIS — I483 Typical atrial flutter: Secondary | ICD-10-CM | POA: Diagnosis not present

## 2021-02-12 DIAGNOSIS — F419 Anxiety disorder, unspecified: Secondary | ICD-10-CM | POA: Diagnosis not present

## 2021-02-12 DIAGNOSIS — D649 Anemia, unspecified: Secondary | ICD-10-CM

## 2021-02-12 DIAGNOSIS — K219 Gastro-esophageal reflux disease without esophagitis: Secondary | ICD-10-CM

## 2021-02-12 DIAGNOSIS — E785 Hyperlipidemia, unspecified: Secondary | ICD-10-CM | POA: Diagnosis not present

## 2021-02-12 DIAGNOSIS — I10 Essential (primary) hypertension: Secondary | ICD-10-CM

## 2021-02-12 DIAGNOSIS — I4892 Unspecified atrial flutter: Secondary | ICD-10-CM | POA: Insufficient documentation

## 2021-02-12 MED ORDER — FUROSEMIDE 20 MG PO TABS: 20.0000 mg | ORAL_TABLET | Freq: Every day | ORAL | 0 refills | Status: DC

## 2021-02-12 NOTE — H&P (View-Only) (Signed)
Cardiology Office Note    Date:  02/12/2021   ID:  Rodney Delacruz, DOB 1934/05/30, MRN [id number]  PCP:  [name], MD  Cardiologist:  Ida Rogue, MD  Electrophysiologist:  None   Chief Complaint: Follow up atrial flutter  History of Present Illness:   Rodney Delacruz is a 87 y.o. male with history of CAD status post prior PCI s/p 3-vessel CABG on 01/31/2008 (LIMA-LAD, SVG-OM1, SVG-distal RCA) at [facility], recently diagnosed typical atrial flutter on Eliquis, HTN, HLD intolerant to statins secondary to myalgias, anemia, and prior tobacco use quitting in the 61s who presents for follow-up of recently diagnosed atrial flutter.  LHC from 2009 demonstrated severe multivessel CAD with recommendation to undergo CABG.  Most recent ischemic evaluation by exercise MPI in 10/2012 which showed no significant ischemia with an EF of 55%.  Overall, this was a low risk scan.  More recently, he was evaluated by his primary cardiologist on 12/23/2020 at which time he was without chest pain or dyspnea.  EKG showed new atrial flutter with controlled ventricular response.  He was started on Eliquis 5 mg twice daily.  No indicates he was not inclined to proceed with DCCV.  Recent labs obtained just a couple days prior demonstrated a normal TSH and potassium at goal.  Subsequent echo on 01/25/2021 showed an EF of 60-65%, no regional wall motion abnormalities, normal LV diastolic function parameters, normal RV systolic function and ventricular cavity size, normal PASP, mildly dilated left atrium measuring 45 mm, mild mitral regurgitation with moderate mitral annular calcification, mild to moderate tricuspid regurgitation, mild aortic insufficiency, and mild to moderate aortic valve sclerosis without evidence of stenosis.  Following this, he reported lower extremity edema in 01/2021 and was interested in moving forward with DCCV.  In this setting, he was started on prn Lasix.   He comes in today noting a couple week  history of bilateral lower extremity swelling to the mid shins.  He also notes some intermittent randomly occurring short-lived chest tightness.  No dyspnea or palpitations.  Heart rates have predominantly been in the 70s with occasional readings in the low 100s.  At baseline, and sinus, his rates are in the upper 50s to 60s bpm.  He has not missed any doses of Eliquis and has been taking this twice daily.  No falls, hematochezia, or melena.  He does have amlodipine on his medication list though takes this as needed, typically 2 to 3 days/week.  More recently he has noted some elevated BP readings.  He is interested in moving forward with DCCV.   Labs independently reviewed: 12/2020 - potassium 4.9, BUN 22, serum creatinine 1.48, AST/ALT normal, albumin 4.1, TSH normal, TC 174, TG 74, HDL 53, LDL 106, HGB 12.7, PLT 261  Past Medical History:  Diagnosis Date   Allergy    Anxiety    Coronary artery disease    Depression    Elevated PSA    Followed by Dr. Rosana Berger every 6 month   Heart murmur    Hyperlipidemia    Hypertension    MI (myocardial infarction) [facility]) 2009   Ulcer     Past Surgical History:  Procedure Laterality Date   CARDIAC CATHETERIZATION  2005, 2009   CHOLECYSTECTOMY  2001   CORONARY ANGIOPLASTY  2005   s/p stent placement @ [facility]   CORONARY ARTERY BYPASS GRAFT  2009   [facility]   TONSILLECTOMY AND ADENOIDECTOMY  1942    Current Medications: Current Meds  Medication Sig   Alirocumab (PRALUENT) 75 MG/ML SOAJ Inject 75 mg into the skin every 14 (fourteen) days.   amLODipine (NORVASC) 10 MG tablet Take 10 mg by mouth every other day.   apixaban (ELIQUIS) 5 MG TABS tablet Take 1 tablet (5 mg total) by mouth 2 (two) times daily.   fluticasone (FLONASE) 50 MCG/ACT nasal spray SPRAY 2 SPRAYS INTO EACH NOSTRIL EVERY DAY   furosemide (LASIX) 20 MG tablet Take 1 tablet (20 mg total) by mouth daily.   metoprolol succinate (TOPROL-XL) 25 MG 24 hr tablet TAKE ONE TABLET BY MOUTH DAILY    Multiple Vitamin (MULTI-VITAMIN DAILY PO) Take by mouth.   nitroGLYCERIN (NITROSTAT) 0.4 MG SL tablet PLACE 1 TABLET UNDER THE TONGUE EVERY 5 MINUTES AS NEEDED FOR CHEST PAIN. MAXIMUM OF 3 DOSES.   omeprazole (PRILOSEC) 20 MG capsule TAKE ONE CAPSULE BY MOUTH DAILY   ramipril (ALTACE) 10 MG capsule TAKE ONE CAPSULE BY MOUTH DAILY   sildenafil (VIAGRA) 50 MG tablet TAKE 1-2 TABLETS BY MOUTH DAILY AS NEEDED FOR ERECTILE DYSFUNCTION    Allergies:   Plavix [clopidogrel bisulfate]   Social History   Socioeconomic History   Marital status: Married    Spouse name: Olin Hauser   Number of children: 2   Years of education: 12   Highest education level: Not on file  Occupational History   Occupation: Information systems manager    Comment: Retired  Tobacco Use   Smoking status: Former    Packs/day: 1.00    Years: 20.00    Pack years: 20.00    Types: Cigarettes    Quit date: 08/15/1976    Years since quitting: 44.5   Smokeless tobacco: Never  Substance and Sexual Activity   Alcohol use: Yes    Alcohol/week: 0.0 standard drinks    Comment: Occasional   Drug use: No   Sexual activity: Not on file  Other Topics Concern   Not on file  Social History Narrative   Rodney Delacruz grew up in [place], [place]. He currently lives in [place] with his wife, Olin Hauser, of 14 years. He was married previously to his high school sweetheart. They were married for 33 years. He widowed in 1993. They had two son Christia Reading and Ngai). Rodney Delacruz sons live locally [facility] and [place]). He worked at [employer] and retired from [employer]. He was there for 20 years. He worked in Designer, television/film set. He enjoys fishing, reading, watching TV on his spare time. He also enjoys walking and visiting friends.   Social Determinants of Health   Financial Resource Strain: Medium Risk   Difficulty of Paying Living Expenses: Somewhat hard  Food Insecurity: Not on file  Transportation Needs: Not on file   Physical Activity: Not on file  Stress: Not on file  Social Connections: Not on file     Family History:  The patient's family history includes Alcohol abuse in his father and paternal grandfather; Diabetes in his sister; Heart attack in his brother; Heart disease in his brother; Stroke in his mother.  ROS:   Review of Systems  Constitutional:  Negative for chills, diaphoresis, fever, malaise/fatigue and weight loss.  HENT:  Negative for congestion.   Eyes:  Negative for discharge and redness.  Respiratory:  Negative for cough, sputum production, shortness of breath and wheezing.   Cardiovascular:  Positive for leg swelling. Negative for chest pain, palpitations, orthopnea, claudication and PND.       Chest tightness  Gastrointestinal:  Negative for abdominal pain, blood in stool, heartburn, melena, nausea and vomiting.  Musculoskeletal:  Negative for falls and myalgias.  Skin:  Negative for rash.  Neurological:  Negative for dizziness, tingling, tremors, sensory change, speech change, focal weakness, loss of consciousness and weakness.  Endo/Heme/Allergies:  Does not bruise/bleed easily.  Psychiatric/Behavioral:  Negative for substance abuse. The patient is not nervous/anxious.   All other systems reviewed and are negative.   EKGs/Labs/Other Studies Reviewed:    Studies reviewed were summarized above. The additional studies were reviewed today:   1. Left ventricular ejection fraction, by estimation, is 60 to 65%. The  left ventricle has normal function. The left ventricle has no regional  wall motion abnormalities. Left ventricular diastolic parameters were  normal.   2. Right ventricular systolic function is normal. The right ventricular  size is normal. There is normal pulmonary artery systolic pressure.   3. Left atrial size was mildly dilated.   4. The mitral valve is normal in structure. Mild mitral valve  regurgitation. No evidence of mitral stenosis. Moderate mitral  annular  calcification.   5. Tricuspid valve regurgitation is mild to moderate.   6. The aortic valve is normal in structure. Aortic valve regurgitation is  mild. Mild to moderate aortic valve sclerosis/calcification is present,  without any evidence of aortic stenosis.   7. The inferior vena cava is dilated in size with >50% respiratory  variability, suggesting right atrial pressure of 8 mmHg.  __________  Exercise MPI 09/2012: IMPRESSION:   Exercise myocardial perfusion imaging study with no significant ischemia noted. Normal ejection fraction estimated at greater than 55%. No focal wall motion abnormality noted. No EKG changes concerning for ischemia. Overall, this is a low risk scan.   EKG:  EKG is ordered today.  The EKG ordered today demonstrates atrial flutter with 41 AV block, 73 bpm, no acute ST-T changes  Recent Labs: 12/21/2020: ALT 16; BUN 22; Creatinine, Ser 1.48; Hemoglobin 12.7; Platelets 261.0; Potassium 4.9; Sodium 139; TSH 1.99  Recent Lipid Panel    Component Value Date/Time   CHOL 174 12/21/2020 1038   CHOL 148 10/01/2012 1305   TRIG 74.0 12/21/2020 1038   TRIG 89 10/01/2012 1305   HDL 53.20 12/21/2020 1038   HDL 49 10/01/2012 1305   CHOLHDL 3 12/21/2020 1038   VLDL 14.8 12/21/2020 1038   VLDL 18 10/01/2012 1305   LDLCALC 106 (H) 12/21/2020 1038   LDLCALC 81 10/01/2012 1305   LDLDIRECT 124.0 04/23/2019 0828    PHYSICAL EXAM:    VS:  BP 140/60 (BP Location: Left Arm, Patient Position: Sitting, Cuff Size: Normal)   Pulse 73   Ht 6' (1.829 m)   Wt 176 lb 2 oz (79.9 kg)   SpO2 97%   BMI 23.89 kg/m   BMI: Body mass index is 23.89 kg/m.  Physical Exam Vitals reviewed.  Constitutional:      Appearance: He is well-developed.  HENT:     Head: Normocephalic and atraumatic.  Eyes:     General:        Right eye: No discharge.        Left eye: No discharge.  Neck:     Vascular: No JVD.  Cardiovascular:     Rate and Rhythm: Normal rate and regular  rhythm.     Heart sounds: Normal heart sounds, S1 normal and S2 normal. Heart sounds not distant. No midsystolic click and no opening snap. No murmur heard.   No friction rub.  Pulmonary:  Effort: Pulmonary effort is normal. No respiratory distress.     Breath sounds: Normal breath sounds. No decreased breath sounds, wheezing or rales.  Chest:     Chest wall: No tenderness.  Abdominal:     General: There is no distension.     Palpations: Abdomen is soft.     Tenderness: There is no abdominal tenderness.  Musculoskeletal:     Cervical back: Normal range of motion.     Right lower leg: Edema present.     Left lower leg: Edema present.     Comments: 1+ bilateral pretibial edema  Skin:    General: Skin is warm and dry.     Nails: There is no clubbing.  Neurological:     Mental Status: He is alert and oriented to person, place, and time.  Psychiatric:        Speech: Speech normal.        Behavior: Behavior normal.        Thought Content: Thought content normal.        Judgment: Judgment normal.    Wt Readings from Last 3 Encounters:  02/12/21 176 lb 2 oz (79.9 kg)  12/23/20 172 lb (78 kg)  12/21/20 173 lb (78.5 kg)     ASSESSMENT & PLAN:   Persistent atrial flutter: He remains in atrial flutter with controlled ventricular response.  Continue Toprol-XL 25 mg daily for rate control.  Resting sinus heart rates in the 50s to 60s bpm precludes escalation of beta-blocker at this time.  Potassium at goal and TSH normal.  He has been adequately anticoagulated for greater than 4 weeks without missing any doses.  Schedule DCCV.  Risks discussed in detail.  CHADS2VASc 4 (HTN, age x 2, vascular disease).  CAD s/p CABG: No symptoms of angina.  He does note some randomly occurring intermittent chest tightness, possibly related to fluid accumulation/atrial flutter.  Plan to pursue rhythm control as outlined above.  Should symptoms persist, we can consider Lexiscan MPI in follow-up.  On Eliquis  in place of aspirin.  Lower extremity swelling: Likely in the setting of atrial flutter.  Start Lasix 20 mg daily for 5 days.  Check BMP in 1 week.  He is also on amlodipine 10 mg which could be contributing however, he takes this only approximately 2-3 times per week.  In this setting, we will pursue rhythm control of atrial flutter initially.  Following diuresis and rhythm control, should his lower extremity swelling persist, would recommend tapering off amlodipine with escalation of ramipril.  HTN: Blood pressure is mildly elevated in the office today.  Diuresis as above.  He will otherwise continue current medical therapy.  HLD: LDL 106 in 12/2020 with goal being < 70.  Currently on Praluent.  Follow-up lipid panel as previously directed.  Anemia: Recheck CBC on DOAC.    Disposition: F/u with Dr. Rockey Situ or an APP in 1 month (DCCV delayed until 7/12, due to scheduling).   Medication Adjustments/Labs and Tests Ordered: Current medicines are reviewed at length with the patient today.  Concerns regarding medicines are outlined above. Medication changes, Labs and Tests ordered today are summarized above and listed in the Patient Instructions accessible in Encounters.   Signed, Christell Faith, PA-C 02/12/2021 12:33 PM     [address] [place], [place] [address] [phone]

## 2021-02-12 NOTE — Progress Notes (Signed)
Cardiology Office Note    Date:  02/12/2021   ID:  Rodney Delacruz, DOB June 21, 1934, MRN [id number]  PCP:  [name], MD  Cardiologist:  Ida Rogue, MD  Electrophysiologist:  None   Chief Complaint: Follow up atrial flutter  History of Present Illness:   Rodney Delacruz is a 87 y.o. male with history of CAD status post prior PCI s/p 3-vessel CABG on 01/31/2008 (LIMA-LAD, SVG-OM1, SVG-distal RCA) at [facility], recently diagnosed typical atrial flutter on Eliquis, HTN, HLD intolerant to statins secondary to myalgias, anemia, and prior tobacco use quitting in the 52s who presents for follow-up of recently diagnosed atrial flutter.  LHC from 2009 demonstrated severe multivessel CAD with recommendation to undergo CABG.  Most recent ischemic evaluation by exercise MPI in 10/2012 which showed no significant ischemia with an EF of 55%.  Overall, this was a low risk scan.  More recently, he was evaluated by his primary cardiologist on 12/23/2020 at which time he was without chest pain or dyspnea.  EKG showed new atrial flutter with controlled ventricular response.  He was started on Eliquis 5 mg twice daily.  No indicates he was not inclined to proceed with DCCV.  Recent labs obtained just a couple days prior demonstrated a normal TSH and potassium at goal.  Subsequent echo on 01/25/2021 showed an EF of 60-65%, no regional wall motion abnormalities, normal LV diastolic function parameters, normal RV systolic function and ventricular cavity size, normal PASP, mildly dilated left atrium measuring 45 mm, mild mitral regurgitation with moderate mitral annular calcification, mild to moderate tricuspid regurgitation, mild aortic insufficiency, and mild to moderate aortic valve sclerosis without evidence of stenosis.  Following this, he reported lower extremity edema in 01/2021 and was interested in moving forward with DCCV.  In this setting, he was started on prn Lasix.   He comes in today noting a couple week  history of bilateral lower extremity swelling to the mid shins.  He also notes some intermittent randomly occurring short-lived chest tightness.  No dyspnea or palpitations.  Heart rates have predominantly been in the 70s with occasional readings in the low 100s.  At baseline, and sinus, his rates are in the upper 50s to 60s bpm.  He has not missed any doses of Eliquis and has been taking this twice daily.  No falls, hematochezia, or melena.  He does have amlodipine on his medication list though takes this as needed, typically 2 to 3 days/week.  More recently he has noted some elevated BP readings.  He is interested in moving forward with DCCV.   Labs independently reviewed: 12/2020 - potassium 4.9, BUN 22, serum creatinine 1.48, AST/ALT normal, albumin 4.1, TSH normal, TC 174, TG 74, HDL 53, LDL 106, HGB 12.7, PLT 261  Past Medical History:  Diagnosis Date   Allergy    Anxiety    Coronary artery disease    Depression    Elevated PSA    Followed by Dr. Rosana Berger every 6 month   Heart murmur    Hyperlipidemia    Hypertension    MI (myocardial infarction) [facility]) 2009   Ulcer     Past Surgical History:  Procedure Laterality Date   CARDIAC CATHETERIZATION  2005, 2009   CHOLECYSTECTOMY  2001   CORONARY ANGIOPLASTY  2005   s/p stent placement @ [facility]   CORONARY ARTERY BYPASS GRAFT  2009   [facility]   TONSILLECTOMY AND ADENOIDECTOMY  1942    Current Medications: Current Meds  Medication Sig   Alirocumab (PRALUENT) 75 MG/ML SOAJ Inject 75 mg into the skin every 14 (fourteen) days.   amLODipine (NORVASC) 10 MG tablet Take 10 mg by mouth every other day.   apixaban (ELIQUIS) 5 MG TABS tablet Take 1 tablet (5 mg total) by mouth 2 (two) times daily.   fluticasone (FLONASE) 50 MCG/ACT nasal spray SPRAY 2 SPRAYS INTO EACH NOSTRIL EVERY DAY   furosemide (LASIX) 20 MG tablet Take 1 tablet (20 mg total) by mouth daily.   metoprolol succinate (TOPROL-XL) 25 MG 24 hr tablet TAKE ONE TABLET BY MOUTH DAILY    Multiple Vitamin (MULTI-VITAMIN DAILY PO) Take by mouth.   nitroGLYCERIN (NITROSTAT) 0.4 MG SL tablet PLACE 1 TABLET UNDER THE TONGUE EVERY 5 MINUTES AS NEEDED FOR CHEST PAIN. MAXIMUM OF 3 DOSES.   omeprazole (PRILOSEC) 20 MG capsule TAKE ONE CAPSULE BY MOUTH DAILY   ramipril (ALTACE) 10 MG capsule TAKE ONE CAPSULE BY MOUTH DAILY   sildenafil (VIAGRA) 50 MG tablet TAKE 1-2 TABLETS BY MOUTH DAILY AS NEEDED FOR ERECTILE DYSFUNCTION    Allergies:   Plavix [clopidogrel bisulfate]   Social History   Socioeconomic History   Marital status: Married    Spouse name: Olin Hauser   Number of children: 2   Years of education: 12   Highest education level: Not on file  Occupational History   Occupation: Information systems manager    Comment: Retired  Tobacco Use   Smoking status: Former    Packs/day: 1.00    Years: 20.00    Pack years: 20.00    Types: Cigarettes    Quit date: 08/15/1976    Years since quitting: 44.5   Smokeless tobacco: Never  Substance and Sexual Activity   Alcohol use: Yes    Alcohol/week: 0.0 standard drinks    Comment: Occasional   Drug use: No   Sexual activity: Not on file  Other Topics Concern   Not on file  Social History Narrative   [name] grew up in [place], [place]. He currently lives in [place] with his wife, Olin Hauser, of 14 years. He was married previously to his high school sweetheart. They were married for 33 years. He widowed in 1993. They had two son Christia Reading and Haywood). [name] sons live locally [facility] and [place]). He worked at [employer] and retired from [employer]. He was there for 20 years. He worked in Designer, television/film set. He enjoys fishing, reading, watching TV on his spare time. He also enjoys walking and visiting friends.   Social Determinants of Health   Financial Resource Strain: Medium Risk   Difficulty of Paying Living Expenses: Somewhat hard  Food Insecurity: Not on file  Transportation Needs: Not on file   Physical Activity: Not on file  Stress: Not on file  Social Connections: Not on file     Family History:  The patient's family history includes Alcohol abuse in his father and paternal grandfather; Diabetes in his sister; Heart attack in his brother; Heart disease in his brother; Stroke in his mother.  ROS:   Review of Systems  Constitutional:  Negative for chills, diaphoresis, fever, malaise/fatigue and weight loss.  HENT:  Negative for congestion.   Eyes:  Negative for discharge and redness.  Respiratory:  Negative for cough, sputum production, shortness of breath and wheezing.   Cardiovascular:  Positive for leg swelling. Negative for chest pain, palpitations, orthopnea, claudication and PND.       Chest tightness  Gastrointestinal:  Negative for abdominal pain, blood in stool, heartburn, melena, nausea and vomiting.  Musculoskeletal:  Negative for falls and myalgias.  Skin:  Negative for rash.  Neurological:  Negative for dizziness, tingling, tremors, sensory change, speech change, focal weakness, loss of consciousness and weakness.  Endo/Heme/Allergies:  Does not bruise/bleed easily.  Psychiatric/Behavioral:  Negative for substance abuse. The patient is not nervous/anxious.   All other systems reviewed and are negative.   EKGs/Labs/Other Studies Reviewed:    Studies reviewed were summarized above. The additional studies were reviewed today:   1. Left ventricular ejection fraction, by estimation, is 60 to 65%. The  left ventricle has normal function. The left ventricle has no regional  wall motion abnormalities. Left ventricular diastolic parameters were  normal.   2. Right ventricular systolic function is normal. The right ventricular  size is normal. There is normal pulmonary artery systolic pressure.   3. Left atrial size was mildly dilated.   4. The mitral valve is normal in structure. Mild mitral valve  regurgitation. No evidence of mitral stenosis. Moderate mitral  annular  calcification.   5. Tricuspid valve regurgitation is mild to moderate.   6. The aortic valve is normal in structure. Aortic valve regurgitation is  mild. Mild to moderate aortic valve sclerosis/calcification is present,  without any evidence of aortic stenosis.   7. The inferior vena cava is dilated in size with >50% respiratory  variability, suggesting right atrial pressure of 8 mmHg.  __________  Exercise MPI 09/2012: IMPRESSION:   Exercise myocardial perfusion imaging study with no significant ischemia noted. Normal ejection fraction estimated at greater than 55%. No focal wall motion abnormality noted. No EKG changes concerning for ischemia. Overall, this is a low risk scan.   EKG:  EKG is ordered today.  The EKG ordered today demonstrates atrial flutter with 41 AV block, 73 bpm, no acute ST-T changes  Recent Labs: 12/21/2020: ALT 16; BUN 22; Creatinine, Ser 1.48; Hemoglobin 12.7; Platelets 261.0; Potassium 4.9; Sodium 139; TSH 1.99  Recent Lipid Panel    Component Value Date/Time   CHOL 174 12/21/2020 1038   CHOL 148 10/01/2012 1305   TRIG 74.0 12/21/2020 1038   TRIG 89 10/01/2012 1305   HDL 53.20 12/21/2020 1038   HDL 49 10/01/2012 1305   CHOLHDL 3 12/21/2020 1038   VLDL 14.8 12/21/2020 1038   VLDL 18 10/01/2012 1305   LDLCALC 106 (H) 12/21/2020 1038   LDLCALC 81 10/01/2012 1305   LDLDIRECT 124.0 04/23/2019 0828    PHYSICAL EXAM:    VS:  BP 140/60 (BP Location: Left Arm, Patient Position: Sitting, Cuff Size: Normal)   Pulse 73   Ht 6' (1.829 m)   Wt 176 lb 2 oz (79.9 kg)   SpO2 97%   BMI 23.89 kg/m   BMI: Body mass index is 23.89 kg/m.  Physical Exam Vitals reviewed.  Constitutional:      Appearance: He is well-developed.  HENT:     Head: Normocephalic and atraumatic.  Eyes:     General:        Right eye: No discharge.        Left eye: No discharge.  Neck:     Vascular: No JVD.  Cardiovascular:     Rate and Rhythm: Normal rate and regular  rhythm.     Heart sounds: Normal heart sounds, S1 normal and S2 normal. Heart sounds not distant. No midsystolic click and no opening snap. No murmur heard.   No friction rub.  Pulmonary:  Effort: Pulmonary effort is normal. No respiratory distress.     Breath sounds: Normal breath sounds. No decreased breath sounds, wheezing or rales.  Chest:     Chest wall: No tenderness.  Abdominal:     General: There is no distension.     Palpations: Abdomen is soft.     Tenderness: There is no abdominal tenderness.  Musculoskeletal:     Cervical back: Normal range of motion.     Right lower leg: Edema present.     Left lower leg: Edema present.     Comments: 1+ bilateral pretibial edema  Skin:    General: Skin is warm and dry.     Nails: There is no clubbing.  Neurological:     Mental Status: He is alert and oriented to person, place, and time.  Psychiatric:        Speech: Speech normal.        Behavior: Behavior normal.        Thought Content: Thought content normal.        Judgment: Judgment normal.    Wt Readings from Last 3 Encounters:  02/12/21 176 lb 2 oz (79.9 kg)  12/23/20 172 lb (78 kg)  12/21/20 173 lb (78.5 kg)     ASSESSMENT & PLAN:   Persistent atrial flutter: He remains in atrial flutter with controlled ventricular response.  Continue Toprol-XL 25 mg daily for rate control.  Resting sinus heart rates in the 50s to 60s bpm precludes escalation of beta-blocker at this time.  Potassium at goal and TSH normal.  He has been adequately anticoagulated for greater than 4 weeks without missing any doses.  Schedule DCCV.  Risks discussed in detail.  CHADS2VASc 4 (HTN, age x 2, vascular disease).  CAD s/p CABG: No symptoms of angina.  He does note some randomly occurring intermittent chest tightness, possibly related to fluid accumulation/atrial flutter.  Plan to pursue rhythm control as outlined above.  Should symptoms persist, we can consider Lexiscan MPI in follow-up.  On Eliquis  in place of aspirin.  Lower extremity swelling: Likely in the setting of atrial flutter.  Start Lasix 20 mg daily for 5 days.  Check BMP in 1 week.  He is also on amlodipine 10 mg which could be contributing however, he takes this only approximately 2-3 times per week.  In this setting, we will pursue rhythm control of atrial flutter initially.  Following diuresis and rhythm control, should his lower extremity swelling persist, would recommend tapering off amlodipine with escalation of ramipril.  HTN: Blood pressure is mildly elevated in the office today.  Diuresis as above.  He will otherwise continue current medical therapy.  HLD: LDL 106 in 12/2020 with goal being < 70.  Currently on Praluent.  Follow-up lipid panel as previously directed.  Anemia: Recheck CBC on DOAC.    Disposition: F/u with Dr. Rockey Situ or an APP in 1 month (DCCV delayed until 7/12, due to scheduling).   Medication Adjustments/Labs and Tests Ordered: Current medicines are reviewed at length with the patient today.  Concerns regarding medicines are outlined above. Medication changes, Labs and Tests ordered today are summarized above and listed in the Patient Instructions accessible in Encounters.   Signed, Christell Faith, PA-C 02/12/2021 12:33 PM     [address] [place], [place] [address] [phone]

## 2021-02-12 NOTE — Patient Instructions (Addendum)
Medication Instructions:  Your physician has recommended you make the following change in your medication:   START Furosemide (Lasix) 20 mg for just 5 days.   *If you need a refill on your cardiac medications before your next appointment, please call your pharmacy*   Lab Work: BMET and CBC in one week. Go to Medical Mall Entrance at [facility] then go to 1st desk on the right to check in, past the screening table. No appointment is needed for this.   Lab hours: Monday- Friday (7:30 am- 5:30 pm)  If you have labs (blood work) drawn today and your tests are completely normal, you will receive your results only by: [place] (if you have MyChart) OR A paper copy in the mail If you have any lab test that is abnormal or we need to change your treatment, we will call you to review the results.   Testing/Procedures: You are scheduled for a Cardioversion on July 12 th with Dr. Rockey Situ. Please arrive at the [place] [facility] at 06:30 a.m. on the day of your procedure.  DIET INSTRUCTIONS:  Nothing to eat or drink after midnight except your medications with a small sip of water.         Medications:  YOU MAY TAKE ALL of your remaining medications with a small amount of water.  Must have a responsible person to drive you home.  Bring a current list of your medications and current insurance cards.    If you have any questions after you get home, please call the office at [phone]    Follow-Up: At [facility], you and your health needs are our priority.  As part of our continuing mission to provide you with exceptional heart care, we have created designated Provider Care Teams.  These Care Teams include your primary Cardiologist (physician) and Advanced Practice Providers (APPs -  Physician Assistants and Nurse Practitioners) who all work together to provide you with the care you need, when you need it.   Your next appointment:   1 month(s)  The format for your next appointment:    In Person  Provider:   Ida Rogue, MD or Christell Faith, PA-C

## 2021-02-12 NOTE — Assessment & Plan Note (Signed)
He will remain on his Eliquis.  He will follow up with cardiology as planned.

## 2021-02-12 NOTE — Assessment & Plan Note (Signed)
Weight has trended up.  He will continue to eat adequately and monitor his weight.

## 2021-02-12 NOTE — Patient Instructions (Signed)
Nice to see you. Please try stopping the omeprazole. Please keep an eye on your weight and try to eat adequately.  If you notice your weight trending down please let us know.

## 2021-02-12 NOTE — Progress Notes (Signed)
Tommi Rumps, MD Phone: [phone]  Rodney Delacruz is a 87 y.o. male who presents today for follow-up.  Anxiety: Patient notes no anxiety since stopping the Wellbutrin.  He notes no depression.  Weight loss: His weight has trended up since his last visit.  He is trying to eat a little bit more.  He does walk 3-4 times per week for exercise.  Atrial fibrillation: He is scheduled to have a cardioversion in July.  He does take his Eliquis.  GERD: He notes no reflux.  He has decreased his omeprazole to every other day and notes no change to his lack of symptoms.  He wonders about stopping this medication.  Social History   Tobacco Use  Smoking Status Former   Packs/day: 1.00   Years: 20.00   Pack years: 20.00   Types: Cigarettes   Quit date: 08/15/1976   Years since quitting: 44.5  Smokeless Tobacco Never    Current Outpatient Medications on File Prior to Visit  Medication Sig Dispense Refill   Alirocumab (PRALUENT) 75 MG/ML SOAJ Inject 75 mg into the skin every 14 (fourteen) days. 2 mL 2   apixaban (ELIQUIS) 5 MG TABS tablet Take 1 tablet (5 mg total) by mouth 2 (two) times daily. 60 tablet 6   fluticasone (FLONASE) 50 MCG/ACT nasal spray SPRAY 2 SPRAYS INTO EACH NOSTRIL EVERY DAY 48 mL 0   metoprolol succinate (TOPROL-XL) 25 MG 24 hr tablet TAKE ONE TABLET BY MOUTH DAILY 90 tablet 0   Multiple Vitamin (MULTI-VITAMIN DAILY PO) Take by mouth.     nitroGLYCERIN (NITROSTAT) 0.4 MG SL tablet PLACE 1 TABLET UNDER THE TONGUE EVERY 5 MINUTES AS NEEDED FOR CHEST PAIN. MAXIMUM OF 3 DOSES. 25 tablet 0   ramipril (ALTACE) 10 MG capsule TAKE ONE CAPSULE BY MOUTH DAILY 90 capsule 0   sildenafil (VIAGRA) 50 MG tablet TAKE 1-2 TABLETS BY MOUTH DAILY AS NEEDED FOR ERECTILE DYSFUNCTION 10 tablet 0   No current facility-administered medications on file prior to visit.     ROS see history of present illness  Objective  Physical Exam Vitals:   02/12/21 1615  BP: 104/60  Pulse: 68   Temp: 98.5 F (36.9 C)  SpO2: 97%    BP Readings from Last 3 Encounters:  02/12/21 104/60  02/12/21 140/60  12/23/20 128/70   Wt Readings from Last 3 Encounters:  02/12/21 176 lb (79.8 kg)  02/12/21 176 lb 2 oz (79.9 kg)  12/23/20 172 lb (78 kg)    Physical Exam Constitutional:      General: He is not in acute distress.    Appearance: He is not diaphoretic.  Cardiovascular:     Rate and Rhythm: Normal rate and regular rhythm.     Heart sounds: Normal heart sounds.  Pulmonary:     Effort: Pulmonary effort is normal.     Breath sounds: Normal breath sounds.  Skin:    General: Skin is warm and dry.  Neurological:     Mental Status: He is alert.     Assessment/Plan: Please see individual problem list.  Problem List Items Addressed This Visit     Anxiety    Asymptomatic.  He will remain off of the Wellbutrin.  He will monitor for recurrence.       Atrial flutter ([place])    He will remain on his Eliquis.  He will follow up with cardiology as planned.       GERD (gastroesophageal reflux disease)    Patient has  tapered down on this medication.  He has had no symptoms with every other day dosing.  I discussed that he could attempt to come off of this medicine and if he had recurrence of symptoms he can always go back on it for try Pepcid.       Weight loss    Weight has trended up.  He will continue to eat adequately and monitor his weight.       Return in about 4 months (around 06/15/2021) for Weight, hypertension.  This visit occurred during the SARS-CoV-2 public health emergency.  Safety protocols were in place, including screening questions prior to the visit, additional usage of staff PPE, and extensive cleaning of exam room while observing appropriate contact time as indicated for disinfecting solutions.    Tommi Rumps, MD [place]

## 2021-02-12 NOTE — Assessment & Plan Note (Signed)
Asymptomatic.  He will remain off of the Wellbutrin.  He will monitor for recurrence.

## 2021-02-12 NOTE — Assessment & Plan Note (Signed)
Patient has tapered down on this medication.  He has had no symptoms with every other day dosing.  I discussed that he could attempt to come off of this medicine and if he had recurrence of symptoms he can always go back on it for try Pepcid.

## 2021-02-17 ENCOUNTER — Telehealth: Payer: Self-pay | Admitting: Cardiovascular Disease

## 2021-02-17 NOTE — Telephone Encounter (Addendum)
Was able to reach back out to Rodney Delacruz, he reports he confused his wife because he couldn't the "proper name" for the procedure. Reeducated Rodney Delacruz on the "cardioversion, not a cardiac ablation". Reviewed date and time of procedure along with instructions. Rodney Delacruz very thankful for the call, will let Rodney Delacruz know that I return call, she is currently getting ready for work. Nothing further at this time.

## 2021-02-17 NOTE — Telephone Encounter (Signed)
Patient wife calling to discuss details of ablation.   She prefers a call back from [facility].   Please call afternoon as she will be in a meeting now.

## 2021-02-19 ENCOUNTER — Other Ambulatory Visit
Admission: RE | Admit: 2021-02-19 | Discharge: 2021-02-19 | Disposition: A | Discharge status: Home / Self Care | Payer: Medicare Other | Attending: Physician Assistant | Admitting: Physician Assistant

## 2021-02-19 DIAGNOSIS — I483 Typical atrial flutter: Secondary | ICD-10-CM | POA: Insufficient documentation

## 2021-02-19 DIAGNOSIS — M7989 Other specified soft tissue disorders: Secondary | ICD-10-CM | POA: Diagnosis not present

## 2021-02-19 DIAGNOSIS — I2510 Atherosclerotic heart disease of native coronary artery without angina pectoris: Secondary | ICD-10-CM | POA: Diagnosis not present

## 2021-02-19 DIAGNOSIS — I10 Essential (primary) hypertension: Secondary | ICD-10-CM | POA: Diagnosis not present

## 2021-02-19 DIAGNOSIS — Z951 Presence of aortocoronary bypass graft: Secondary | ICD-10-CM

## 2021-02-19 DIAGNOSIS — D649 Anemia, unspecified: Secondary | ICD-10-CM | POA: Diagnosis not present

## 2021-02-19 DIAGNOSIS — E785 Hyperlipidemia, unspecified: Secondary | ICD-10-CM | POA: Insufficient documentation

## 2021-02-19 LAB — CBC
HCT: 37.3 % — ABNORMAL LOW (ref 39.0–52.0)
Hemoglobin: 12.1 g/dL — ABNORMAL LOW (ref 13.0–17.0)
MCH: 32.7 pg (ref 26.0–34.0)
MCHC: 32.4 g/dL (ref 30.0–36.0)
MCV: 100.8 fL — ABNORMAL HIGH (ref 80.0–100.0)
Platelets: 275 10*3/uL (ref 150–400)
RBC: 3.7 MIL/uL — ABNORMAL LOW (ref 4.22–5.81)
RDW: 13.2 % (ref 11.5–15.5)
WBC: 8.6 10*3/uL (ref 4.0–10.5)
nRBC: 0 % (ref 0.0–0.2)

## 2021-02-19 LAB — BASIC METABOLIC PANEL
Anion gap: 6 (ref 5–15)
BUN: 22 mg/dL (ref 8–23)
CO2: 27 mmol/L (ref 22–32)
Calcium: 8.9 mg/dL (ref 8.9–10.3)
Chloride: 105 mmol/L (ref 98–111)
Creatinine, Ser: 1.17 mg/dL (ref 0.61–1.24)
GFR, Estimated: >60 mL/min (ref >60)
Glucose, Bld: 108 mg/dL — ABNORMAL HIGH (ref 70–99)
Potassium: 4.8 mmol/L (ref 3.5–5.1)
Sodium: 138 mmol/L (ref 135–145)

## 2021-02-23 ENCOUNTER — Ambulatory Visit
Admission: RE | Admit: 2021-02-23 | Discharge: 2021-02-23 | Disposition: A | Discharge status: Home / Self Care | Payer: Medicare Other | Attending: Cardiovascular Disease | Admitting: Cardiovascular Disease

## 2021-02-23 ENCOUNTER — Encounter: Payer: Self-pay | Admitting: Cardiovascular Disease

## 2021-02-23 ENCOUNTER — Encounter
Admission: RE | Disposition: A | Discharge status: Home / Self Care | Payer: Self-pay | Source: Home / Self Care | Attending: Cardiovascular Disease

## 2021-02-23 ENCOUNTER — Ambulatory Visit: Payer: Medicare Other | Admitting: Anesthesiology

## 2021-02-23 DIAGNOSIS — E785 Hyperlipidemia, unspecified: Secondary | ICD-10-CM | POA: Diagnosis not present

## 2021-02-23 DIAGNOSIS — I10 Essential (primary) hypertension: Secondary | ICD-10-CM | POA: Diagnosis not present

## 2021-02-23 DIAGNOSIS — Z823 Family history of stroke: Secondary | ICD-10-CM | POA: Insufficient documentation

## 2021-02-23 DIAGNOSIS — Z888 Allergy status to other drugs, medicaments and biological substances status: Secondary | ICD-10-CM | POA: Diagnosis not present

## 2021-02-23 DIAGNOSIS — E782 Mixed hyperlipidemia: Secondary | ICD-10-CM | POA: Diagnosis not present

## 2021-02-23 DIAGNOSIS — Z833 Family history of diabetes mellitus: Secondary | ICD-10-CM | POA: Diagnosis not present

## 2021-02-23 DIAGNOSIS — Z8249 Family history of ischemic heart disease and other diseases of the circulatory system: Secondary | ICD-10-CM | POA: Insufficient documentation

## 2021-02-23 DIAGNOSIS — I4892 Unspecified atrial flutter: Secondary | ICD-10-CM | POA: Diagnosis not present

## 2021-02-23 DIAGNOSIS — Z951 Presence of aortocoronary bypass graft: Secondary | ICD-10-CM | POA: Diagnosis not present

## 2021-02-23 DIAGNOSIS — Z7901 Long term (current) use of anticoagulants: Secondary | ICD-10-CM | POA: Insufficient documentation

## 2021-02-23 DIAGNOSIS — I2510 Atherosclerotic heart disease of native coronary artery without angina pectoris: Secondary | ICD-10-CM | POA: Diagnosis not present

## 2021-02-23 DIAGNOSIS — Z79899 Other long term (current) drug therapy: Secondary | ICD-10-CM | POA: Insufficient documentation

## 2021-02-23 DIAGNOSIS — Z955 Presence of coronary angioplasty implant and graft: Secondary | ICD-10-CM | POA: Diagnosis not present

## 2021-02-23 DIAGNOSIS — Z87891 Personal history of nicotine dependence: Secondary | ICD-10-CM | POA: Diagnosis not present

## 2021-02-23 DIAGNOSIS — I4891 Unspecified atrial fibrillation: Secondary | ICD-10-CM | POA: Diagnosis not present

## 2021-02-23 HISTORY — PX: CARDIOVERSION: SHX1299

## 2021-02-23 SURGERY — CARDIOVERSION
Anesthesia: General

## 2021-02-23 MED ORDER — PROPOFOL 10 MG/ML IV BOLUS
INTRAVENOUS | Status: DC | PRN
  Administered 2021-02-23: 40 mg via INTRAVENOUS

## 2021-02-23 MED ORDER — SODIUM CHLORIDE 0.9 % IV SOLN: INTRAVENOUS | Status: DC | PRN

## 2021-02-23 MED ORDER — PROPOFOL 10 MG/ML IV BOLUS
INTRAVENOUS | Status: AC
  Filled 2021-02-23: qty 20

## 2021-02-23 NOTE — Anesthesia Postprocedure Evaluation (Signed)
Anesthesia Post Note  Patient: Rodney Delacruz  Procedure(s) Performed: CARDIOVERSION  Patient location during evaluation: Specials Recovery Anesthesia Type: General Level of consciousness: awake and alert Pain management: pain level controlled Vital Signs Assessment: post-procedure vital signs reviewed and stable Respiratory status: spontaneous breathing, nonlabored ventilation, respiratory function stable and patient connected to nasal cannula oxygen Cardiovascular status: blood pressure returned to baseline and stable Postop Assessment: no apparent nausea or vomiting Anesthetic complications: no   No notable events documented.   Last Vitals:  Vitals:   02/23/21 0739 02/23/21 0740  BP:  (!) 146/65  Pulse: 70 70  Resp: (!) 22 20  Temp:    SpO2: 98% 98%    Last Pain:  Vitals:   02/23/21 0745  TempSrc:   PainSc: 0-No pain                 Arita Miss

## 2021-02-23 NOTE — Anesthesia Procedure Notes (Signed)
Procedure Name: MAC Date/Time: 02/23/2021 7:33 AM Performed by: Jerrye Noble, CRNA Pre-anesthesia Checklist: Patient identified, Emergency Drugs available, Suction available and Patient being monitored Patient Re-evaluated:Patient Re-evaluated prior to induction Oxygen Delivery Method: Nasal cannula

## 2021-02-23 NOTE — Transfer of Care (Signed)
Immediate Anesthesia Transfer of Care Note  Patient: Rodney Delacruz  Procedure(s) Performed: CARDIOVERSION  Patient Location: PACU and special recoveries  Anesthesia Type:General  Level of Consciousness: awake and patient cooperative  Airway & Oxygen Therapy: Patient Spontanous Breathing and Patient connected to nasal cannula oxygen  Post-op Assessment: Report given to RN and Post -op Vital signs reviewed and stable  Post vital signs: Reviewed and stable  Last Vitals:  Vitals Value Taken Time  BP 146/65 02/23/21 0740  Temp    Pulse 70 02/23/21 0740  Resp 20 02/23/21 0740  SpO2 98 % 02/23/21 0740    Last Pain:  Vitals:   02/23/21 0658  TempSrc: Oral  PainSc: 0-No pain         Complications: No notable events documented.

## 2021-02-23 NOTE — CV Procedure (Signed)
Cardioversion procedure note For atrial flutter.  Procedure Details:  Consent: Risks of procedure as well as the alternatives and risks of each were explained to the (patient/caregiver).  Consent for procedure obtained.  Time Out: Verified patient identification, verified procedure, site/side was marked, verified correct patient position, special equipment/implants available, medications/allergies/relevent history reviewed, required imaging and test results available.  Performed  Patient placed on cardiac monitor, pulse oximetry, supplemental oxygen as necessary.   Sedation given: propofol IV, Dr. Zak Pacer pads placed anterior and posterior chest.   Cardioverted 1 time(s).   Cardioverted at  150 J. Synchronized biphasic Converted to NSR   Evaluation: Findings: Post procedure EKG shows: NSR Complications: None Patient did tolerate procedure well.  Time Spent Directly with the Patient:  45 minutes   Tim Nesiah Jump, M.D., Ph.D.  

## 2021-02-23 NOTE — Anesthesia Preprocedure Evaluation (Signed)
Anesthesia Evaluation  Patient identified by MRN, date of birth, ID band Patient awake    Reviewed: Allergy & Precautions, NPO status , Patient's Chart, lab work & pertinent test results  History of Anesthesia Complications Negative for: history of anesthetic complications  Airway Mallampati: III  TM Distance: >3 FB Neck ROM: Full    Dental  (+) Partial Lower   Pulmonary neg pulmonary ROS, neg sleep apnea, neg COPD, Patient abstained from smoking.Not current smoker, former smoker,    Pulmonary exam normal breath sounds clear to auscultation       Cardiovascular Exercise Tolerance: Good METShypertension, + CAD, + Past MI, + Cardiac Stents and + CABG  + dysrhythmias Atrial Fibrillation + Valvular Problems/Murmurs MR  Rhythm:Irregular Rate:Normal - Systolic murmurs 1. Left ventricular ejection fraction, by estimation, is 60 to 65%. The  left ventricle has normal function. The left ventricle has no regional  wall motion abnormalities. Left ventricular diastolic parameters were  normal.  2. Right ventricular systolic function is normal. The right ventricular  size is normal. There is normal pulmonary artery systolic pressure.  3. Left atrial size was mildly dilated.  4. The mitral valve is normal in structure. Mild mitral valve  regurgitation. No evidence of mitral stenosis. Moderate mitral annular  calcification.  5. Tricuspid valve regurgitation is mild to moderate.  6. The aortic valve is normal in structure. Aortic valve regurgitation is  mild. Mild to moderate aortic valve sclerosis/calcification is present,  without any evidence of aortic stenosis.  7. The inferior vena cava is dilated in size with >50% respiratory  variability, suggesting right atrial pressure of 8 mmHg.    Neuro/Psych PSYCHIATRIC DISORDERS Anxiety Depression negative neurological ROS     GI/Hepatic GERD  Controlled,(+)     (-) substance abuse  ,    Endo/Other  neg diabetes  Renal/GU negative Renal ROS     Musculoskeletal   Abdominal   Peds  Hematology   Anesthesia Other Findings Past Medical History: No date: Allergy No date: Anxiety No date: Coronary artery disease No date: Depression No date: Elevated PSA     Comment:  Followed by Dr. Rosana Berger every 6 month No date: Heart murmur No date: Hyperlipidemia No date: Hypertension 2009: MI (myocardial infarction) ([place]) No date: Ulcer  Reproductive/Obstetrics                             Anesthesia Physical Anesthesia Plan  ASA: 3  Anesthesia Plan: General   Post-op Pain Management:    Induction: Intravenous  PONV Risk Score and Plan: 2 and Ondansetron, Propofol infusion and TIVA  Airway Management Planned: Nasal Cannula  Additional Equipment: None  Intra-op Plan:   Post-operative Plan:   Informed Consent: I have reviewed the patients History and Physical, chart, labs and discussed the procedure including the risks, benefits and alternatives for the proposed anesthesia with the patient or authorized representative who has indicated his/her understanding and acceptance.     Dental advisory given  Plan Discussed with: CRNA and Surgeon  Anesthesia Plan Comments: (Discussed risks of anesthesia with patient, including possibility of difficulty with spontaneous ventilation under anesthesia necessitating airway intervention, PONV, and rare risks such as cardiac or respiratory or neurological events. Patient understands.)        Anesthesia Quick Evaluation

## 2021-02-25 ENCOUNTER — Other Ambulatory Visit: Payer: Self-pay | Admitting: Family Medicine

## 2021-02-28 NOTE — Interval H&P Note (Signed)
History and Physical Interval Note:  02/28/2021 9:01 PM  Rodney Delacruz  has presented today for surgery, with the diagnosis of Cardioversion   Afib.  The various methods of treatment have been discussed with the patient and family. After consideration of risks, benefits and other options for treatment, the patient has consented to  Procedure(s): CARDIOVERSION (N/A) as a surgical intervention.  The patient's history has been reviewed, patient examined, no change in status, stable for surgery.  I have reviewed the patient's chart and labs.  Questions were answered to the patient's satisfaction.     Ida Rogue

## 2021-02-28 NOTE — H&P (Signed)
H&P Addendum, pre-cardioversion  Patient was seen and evaluated prior to -cardioversion procedure Symptoms, prior testing details again confirmed with the patient Patient examined, no significant change from prior exam Lab work reviewed in detail personally by myself Patient understands risk and benefit of the procedure, willing to proceed  Signed, Tim Jette Lewan, MD, Ph.D [facility]  

## 2021-03-16 ENCOUNTER — Ambulatory Visit: Payer: Medicare Other | Admitting: Cardiovascular Disease

## 2021-03-18 NOTE — Progress Notes (Signed)
Date:  03/19/2021   ID:  Rodney Delacruz, DOB Sep 03, 1933, MRN [id number]  Patient Location:  [address] [place] [address]   Provider location:   [facility], [place] office  PCP:  [name], MD  Cardiologist:  Arvid [facility]  Chief Complaint  Patient presents with   Cardioversion follow up     "Doing well." Medications reviewed by the patient verbally.     History of Present Illness:   [name] is a 87 yo male with  past medical history of coronary artery disease, prior PCI and bypass surgery in 2008 at [facility],  presenting to [facility] for chest pain on 10/30/2012.  ruled out for MI  Myoview study that showed no ischemia.  GERD.   prior smoking history, stopped in the 70s,  no diabetes He presents today for routine followup of his coronary artery disease, atrial flutter  LOV: 12/23/2020 12/23/2020 in the office new atrial flutter with controlled ventricular response.   started on Eliquis 5 mg twice daily Cardioversion 02/23/21, back in [facility]  No side effects praluent Active 4-5 x a week 1 mile Feels better off statin  Labs reviewed: BMp and CBC stable Total chol 174 LDL 106, before praluent  EKG personally reviewed by myself on todays visit Nsr rate 68 bpm no ST or T wave changes  Other past medical history reviewed Prior statin myalgias on Crestor, Lipitor After missing his Zetia in the past  Last cardiac catheterization 2009  Stress test February 2014   Past Medical History:  Diagnosis Date   Allergy    Anxiety    Coronary artery disease    Depression    Elevated PSA    Followed by Dr. Rosana Berger every 6 month   Heart murmur    Hyperlipidemia    Hypertension    MI (myocardial infarction) [facility]) 2009   Ulcer    Past Surgical History:  Procedure Laterality Date   CARDIAC CATHETERIZATION  2005, 2009   CARDIOVERSION N/A 02/23/2021   Procedure: CARDIOVERSION;  Surgeon: Minna Merritts, MD;  Location: [facility] ORS;   Service: Cardiovascular;  Laterality: N/A;   CHOLECYSTECTOMY  2001   CORONARY ANGIOPLASTY  2005   s/p stent placement @ [facility]   CORONARY ARTERY BYPASS GRAFT  2009   [facility]   TONSILLECTOMY AND ADENOIDECTOMY  1942     Current Meds  Medication Sig   acetaminophen (TYLENOL) 500 MG tablet Take 500 mg by mouth every 6 (six) hours as needed for moderate pain or headache.   Alirocumab (PRALUENT) 75 MG/ML SOAJ Inject 75 mg into the skin every 14 (fourteen) days.   amLODipine (NORVASC) 10 MG tablet Take 10 mg by mouth every other day.   apixaban (ELIQUIS) 5 MG TABS tablet Take 1 tablet (5 mg total) by mouth 2 (two) times daily.   fluticasone (FLONASE) 50 MCG/ACT nasal spray SPRAY TWO SPRAYS IN THE AFFECTED NOSTRIL DAILY   metoprolol succinate (TOPROL-XL) 25 MG 24 hr tablet TAKE ONE TABLET BY MOUTH DAILY   Multiple Vitamin (MULTI-VITAMIN DAILY PO) Take 1 tablet by mouth daily.   nitroGLYCERIN (NITROSTAT) 0.4 MG SL tablet PLACE 1 TABLET UNDER THE TONGUE EVERY 5 MINUTES AS NEEDED FOR CHEST PAIN. MAXIMUM OF 3 DOSES. (Patient taking differently: Place 0.4 mg under the tongue every 5 (five) minutes as needed for chest pain.)   omeprazole (PRILOSEC) 20 MG capsule Take 20 mg by mouth every other day.   ramipril (ALTACE)  10 MG capsule TAKE ONE CAPSULE BY MOUTH DAILY   sildenafil (VIAGRA) 50 MG tablet TAKE 1-2 TABLETS BY MOUTH DAILY AS NEEDED FOR ERECTILE DYSFUNCTION     Allergies:   Plavix [clopidogrel bisulfate]   Social History   Tobacco Use   Smoking status: Former    Packs/day: 1.00    Years: 20.00    Pack years: 20.00    Types: Cigarettes    Quit date: 08/15/1976    Years since quitting: 44.6   Smokeless tobacco: Never  Vaping Use   Vaping Use: Never used  Substance Use Topics   Alcohol use: Yes    Alcohol/week: 0.0 standard drinks    Comment: Occasional   Drug use: No     Family Hx: The patient's family history includes Alcohol abuse in his father and paternal grandfather; Diabetes in  his sister; Heart attack in his brother; Heart disease in his brother; Stroke in his mother.  ROS:   Please see the history of present illness.    Review of Systems  Constitutional: Negative.   HENT: Negative.    Respiratory: Negative.    Cardiovascular: Negative.   Gastrointestinal: Negative.   Musculoskeletal: Negative.   Neurological: Negative.   Psychiatric/Behavioral: Negative.    All other systems reviewed and are negative.   Labs/Other Tests and Data Reviewed:    Recent Labs: 12/21/2020: ALT 16; TSH 1.99 02/19/2021: BUN 22; Creatinine, Ser 1.17; Hemoglobin 12.1; Platelets 275; Potassium 4.8; Sodium 138   Recent Lipid Panel Lab Results  Component Value Date/Time   CHOL 174 12/21/2020 10:38 AM   CHOL 148 10/01/2012 01:05 PM   TRIG 74.0 12/21/2020 10:38 AM   TRIG 89 10/01/2012 01:05 PM   HDL 53.20 12/21/2020 10:38 AM   HDL 49 10/01/2012 01:05 PM   CHOLHDL 3 12/21/2020 10:38 AM   LDLCALC 106 (H) 12/21/2020 10:38 AM   LDLCALC 81 10/01/2012 01:05 PM   LDLDIRECT 124.0 04/23/2019 08:28 AM    Wt Readings from Last 3 Encounters:  03/19/21 175 lb (79.4 kg)  02/23/21 176 lb (79.8 kg)  02/12/21 176 lb (79.8 kg)     Exam:    BP (!) 118/50 (BP Location: Left Arm, Patient Position: Sitting, Cuff Size: Normal)   Pulse 68   Ht 6' (1.829 m)   Wt 175 lb (79.4 kg)   SpO2 98%   BMI 23.73 kg/m  Constitutional:  oriented to person, place, and time. No distress.  HENT:  Head: Grossly normal Eyes:  no discharge. No scleral icterus.  Neck: No JVD, no carotid bruits  Cardiovascular: Regular rate and rhythm, no murmurs appreciated Pulmonary/Chest: Clear to auscultation bilaterally, no wheezes or rails Abdominal: Soft.  no distension.  no tenderness.  Musculoskeletal: Normal range of motion Neurological:  normal muscle tone. Coordination normal. No atrophy Skin: Skin warm and dry Psychiatric: normal affect, pleasant   ASSESSMENT & PLAN:    Atrial flutter Recent cardioversion,  maintaining normal sinus rhythm Continue Eliquis as he was asymptomatic in the past Continue metoprolol succinate 25 daily  Atherosclerosis of native coronary artery of native heart with stable angina pectoris (HCC) Currently with no symptoms of angina. No further workup at this time. Continue current medication regimen.  Essential hypertension Blood pressure is well controlled on today's visit. No changes made to the medications.  S/P CABG (coronary artery bypass graft) Denies anginal symptoms, discussed symptoms to watch for in the future   Total encounter time more than 25 minutes  Greater than 50% was  spent in counseling and coordination of care with the patient     Signed, Ida Rogue, MD  03/19/2021 9:49 AM    [place] [id number], [place], [place] [address]

## 2021-03-19 ENCOUNTER — Ambulatory Visit (INDEPENDENT_AMBULATORY_CARE_PROVIDER_SITE_OTHER): Payer: Medicare Other | Admitting: Cardiovascular Disease

## 2021-03-19 ENCOUNTER — Other Ambulatory Visit: Payer: Self-pay

## 2021-03-19 ENCOUNTER — Encounter: Payer: Self-pay | Admitting: Cardiovascular Disease

## 2021-03-19 VITALS — BP 118/50 | HR 68 | Ht 72.0 in | Wt 175.0 lb

## 2021-03-19 DIAGNOSIS — I2510 Atherosclerotic heart disease of native coronary artery without angina pectoris: Secondary | ICD-10-CM

## 2021-03-19 DIAGNOSIS — M7989 Other specified soft tissue disorders: Secondary | ICD-10-CM | POA: Diagnosis not present

## 2021-03-19 DIAGNOSIS — I483 Typical atrial flutter: Secondary | ICD-10-CM

## 2021-03-19 DIAGNOSIS — Z951 Presence of aortocoronary bypass graft: Secondary | ICD-10-CM | POA: Diagnosis not present

## 2021-03-19 DIAGNOSIS — E785 Hyperlipidemia, unspecified: Secondary | ICD-10-CM | POA: Diagnosis not present

## 2021-03-19 DIAGNOSIS — I10 Essential (primary) hypertension: Secondary | ICD-10-CM

## 2021-03-19 DIAGNOSIS — D649 Anemia, unspecified: Secondary | ICD-10-CM | POA: Diagnosis not present

## 2021-03-19 NOTE — Patient Instructions (Addendum)
Medication Instructions:  Amlodipine 5 to 10 mg daily  as needed for pressure >140  Lasix daily as needed  for worsening ankle swelling  If you need a refill on your cardiac medications before your next appointment, please call your pharmacy.   Lab work: No new labs needed  Testing/Procedures: No new testing needed  Follow-Up: At [facility], you and your health needs are our priority.  As part of our continuing mission to provide you with exceptional heart care, we have created designated Provider Care Teams.  These Care Teams include your primary Cardiologist (physician) and Advanced Practice Providers (APPs -  Physician Assistants and Nurse Practitioners) who all work together to provide you with the care you need, when you need it.  You will need a follow up appointment in 6 months  Providers on your designated Care Team:   Murray Hodgkins, NP Christell Faith, PA-C Marrianne Mood, PA-C [place], [place]  COVID-19 Vaccine Information can be found at: ShippingScam.co.uk For questions related to vaccine distribution or appointments, please email [email or web address] or call [phone] [phone].   Speak with PCP our pulmonary regarding sleep study

## 2021-04-02 ENCOUNTER — Other Ambulatory Visit: Payer: Self-pay | Admitting: Cardiovascular Disease

## 2021-04-02 ENCOUNTER — Other Ambulatory Visit: Payer: Self-pay | Admitting: Family Medicine

## 2021-04-07 ENCOUNTER — Ambulatory Visit: Payer: Medicare Other | Admitting: Cardiovascular Disease

## 2021-04-14 ENCOUNTER — Other Ambulatory Visit: Payer: Self-pay | Admitting: Family Medicine

## 2021-04-14 DIAGNOSIS — I2510 Atherosclerotic heart disease of native coronary artery without angina pectoris: Secondary | ICD-10-CM

## 2021-05-03 ENCOUNTER — Other Ambulatory Visit: Payer: Self-pay | Admitting: Cardiovascular Disease

## 2021-05-11 DIAGNOSIS — Z23 Encounter for immunization: Secondary | ICD-10-CM | POA: Diagnosis not present

## 2021-06-15 ENCOUNTER — Other Ambulatory Visit: Payer: Self-pay

## 2021-06-15 ENCOUNTER — Encounter: Payer: Self-pay | Admitting: Family Medicine

## 2021-06-15 ENCOUNTER — Ambulatory Visit (INDEPENDENT_AMBULATORY_CARE_PROVIDER_SITE_OTHER): Payer: Medicare Other | Admitting: Family Medicine

## 2021-06-15 VITALS — BP 115/60 | HR 69 | Temp 98.5°F | Ht 72.0 in | Wt 177.2 lb

## 2021-06-15 DIAGNOSIS — K219 Gastro-esophageal reflux disease without esophagitis: Secondary | ICD-10-CM | POA: Diagnosis not present

## 2021-06-15 DIAGNOSIS — E782 Mixed hyperlipidemia: Secondary | ICD-10-CM | POA: Diagnosis not present

## 2021-06-15 DIAGNOSIS — R972 Elevated prostate specific antigen [PSA]: Secondary | ICD-10-CM | POA: Diagnosis not present

## 2021-06-15 DIAGNOSIS — I10 Essential (primary) hypertension: Secondary | ICD-10-CM

## 2021-06-15 DIAGNOSIS — F419 Anxiety disorder, unspecified: Secondary | ICD-10-CM | POA: Diagnosis not present

## 2021-06-15 DIAGNOSIS — I2510 Atherosclerotic heart disease of native coronary artery without angina pectoris: Secondary | ICD-10-CM | POA: Diagnosis not present

## 2021-06-15 LAB — COMPREHENSIVE METABOLIC PANEL
ALT: 12 U/L (ref 0–53)
AST: 16 U/L (ref 0–37)
Albumin: 3.9 g/dL (ref 3.5–5.2)
Alkaline Phosphatase: 74 U/L (ref 39–117)
BUN: 20 mg/dL (ref 6–23)
CO2: 27 mEq/L (ref 19–32)
Calcium: 8.8 mg/dL (ref 8.4–10.5)
Chloride: 106 mEq/L (ref 96–112)
Creatinine, Ser: 1.27 mg/dL (ref 0.40–1.50)
GFR: 51.57 mL/min — ABNORMAL LOW (ref >60.00)
Glucose, Bld: 87 mg/dL (ref 70–99)
Potassium: 4.7 mEq/L (ref 3.5–5.1)
Sodium: 139 mEq/L (ref 135–145)
Total Bilirubin: 0.5 mg/dL (ref 0.2–1.2)
Total Protein: 6.4 g/dL (ref 6.0–8.3)

## 2021-06-15 LAB — LIPID PANEL
Cholesterol: 115 mg/dL (ref 0–200)
HDL: 47 mg/dL (ref >39.00)
LDL Cholesterol: 50 mg/dL (ref 0–99)
NonHDL: 68.29
Total CHOL/HDL Ratio: 2
Triglycerides: 90 mg/dL (ref 0.0–149.0)
VLDL: 18 mg/dL (ref 0.0–40.0)

## 2021-06-15 NOTE — Assessment & Plan Note (Signed)
Check lipid panel.  He will continue Praluent 75 mg every 14 days.

## 2021-06-15 NOTE — Progress Notes (Signed)
Tommi Rumps, MD Phone: [phone]  HARSHAAN Delacruz is a 87 y.o. male who presents today for follow-up.  Anxiety: Patient has remained off of medications.  He notes no anxiety symptoms.  GERD: Patient reports rare symptoms of reflux.  He will take omeprazole occasionally and that will resolve his symptoms.  No blood in stool, dysphagia, or abdominal pain.  He does report having had an EGD in the past with dilation done though he has had no issues since.  Hypertension: Typically gets down to a systolic of 627'O after taking his medication.  If his systolic is 350 prior to taking medication he does not take the amlodipine.  He is taking amlodipine, metoprolol, and ramipril.  No chest pain, shortness of breath, or significant edema.  He notes an occasional sock line.  Hyperlipidemia: He has been taking Praluent.  He is due for repeat labs.  Social History   Tobacco Use  Smoking Status Former   Packs/day: 1.00   Years: 20.00   Pack years: 20.00   Types: Cigarettes   Quit date: 08/15/1976   Years since quitting: 44.8  Smokeless Tobacco Never    Current Outpatient Medications on File Prior to Visit  Medication Sig Dispense Refill   acetaminophen (TYLENOL) 500 MG tablet Take 500 mg by mouth every 6 (six) hours as needed for moderate pain or headache.     amLODipine (NORVASC) 10 MG tablet Take 10 mg by mouth every other day.     apixaban (ELIQUIS) 5 MG TABS tablet Take 1 tablet (5 mg total) by mouth 2 (two) times daily. 60 tablet 6   fluticasone (FLONASE) 50 MCG/ACT nasal spray SPRAY TWO SPRAYS IN THE AFFECTED NOSTRIL DAILY 48 mL 0   metoprolol succinate (TOPROL-XL) 25 MG 24 hr tablet TAKE ONE TABLET BY MOUTH DAILY 90 tablet 1   Multiple Vitamin (MULTI-VITAMIN DAILY PO) Take 1 tablet by mouth daily.     nitroGLYCERIN (NITROSTAT) 0.4 MG SL tablet PLACE 1 TABLET UNDER THE TONGUE EVERY 5 MINUTES AS NEEDED FOR CHEST PAIN. MAXIMUM OF 3 DOSES. (Patient taking differently: Place 0.4 mg under  the tongue every 5 (five) minutes as needed for chest pain.) 25 tablet 0   omeprazole (PRILOSEC) 20 MG capsule Take 20 mg by mouth every other day.     PRALUENT 75 MG/ML SOAJ INJECT 75MG INTO THE SKIN EVERY 14 DAYS 2 mL 2   ramipril (ALTACE) 10 MG capsule TAKE ONE CAPSULE BY MOUTH DAILY 90 capsule 2   sildenafil (VIAGRA) 50 MG tablet TAKE 1-2 TABLETS BY MOUTH DAILY AS NEEDED FOR ERECTILE DYSFUNCTION 10 tablet 0   furosemide (LASIX) 20 MG tablet Take 1 tablet (20 mg total) by mouth daily. (Patient not taking: No sig reported) 30 tablet 0   No current facility-administered medications on file prior to visit.     ROS see history of present illness  Objective  Physical Exam Vitals:   06/15/21 1048  BP: 115/60  Pulse: 69  Temp: 98.5 F (36.9 C)  SpO2: 99%    BP Readings from Last 3 Encounters:  06/15/21 115/60  03/19/21 (!) 118/50  02/23/21 (!) 147/62   Wt Readings from Last 3 Encounters:  06/15/21 177 lb 3.2 oz (80.4 kg)  03/19/21 175 lb (79.4 kg)  02/23/21 176 lb (79.8 kg)    Physical Exam Constitutional:      General: He is not in acute distress.    Appearance: He is not diaphoretic.  Cardiovascular:     Rate  and Rhythm: Normal rate and regular rhythm.     Heart sounds: Normal heart sounds.  Pulmonary:     Effort: Pulmonary effort is normal.     Breath sounds: Normal breath sounds.  Musculoskeletal:     Right lower leg: No edema.     Left lower leg: No edema.  Skin:    General: Skin is warm and dry.  Neurological:     Mental Status: He is alert.     Assessment/Plan: Please see individual problem list.  Problem List Items Addressed This Visit     RESOLVED: Anxiety    Continues to have no symptoms.  We will resolve this issue.      Elevated PSA    On review of requested records his PSA has been stable.  Given his age no further evaluation will be undertaken.      Essential hypertension    Adequately controlled.  He will continue metoprolol 25 mg once  daily, amlodipine 10 mg once daily, and ramipril 10 mg once daily.  Check CMP.      GERD (gastroesophageal reflux disease)    He will continue to monitor.  He can take the omeprazole as needed.      Mixed hyperlipidemia - Primary    Check lipid panel.  He will continue Praluent 75 mg every 14 days.      Relevant Orders   Comp Met (CMET)   Lipid panel     Return in about 6 months (around 12/13/2021) for hld, htn.  This visit occurred during the SARS-CoV-2 public health emergency.  Safety protocols were in place, including screening questions prior to the visit, additional usage of staff PPE, and extensive cleaning of exam room while observing appropriate contact time as indicated for disinfecting solutions.    Tommi Rumps, MD [place]

## 2021-06-15 NOTE — Assessment & Plan Note (Signed)
Continues to have no symptoms.  We will resolve this issue.

## 2021-06-15 NOTE — Assessment & Plan Note (Signed)
Adequately controlled.  He will continue metoprolol 25 mg once daily, amlodipine 10 mg once daily, and ramipril 10 mg once daily.  Check CMP.

## 2021-06-15 NOTE — Assessment & Plan Note (Signed)
On review of requested records his PSA has been stable.  Given his age no further evaluation will be undertaken.

## 2021-06-15 NOTE — Patient Instructions (Signed)
Nice to see you. We will check labs today and contact you with the results.  

## 2021-06-15 NOTE — Assessment & Plan Note (Signed)
He will continue to monitor.  He can take the omeprazole as needed.

## 2021-06-24 ENCOUNTER — Other Ambulatory Visit: Payer: Self-pay | Admitting: Family Medicine

## 2021-06-25 ENCOUNTER — Telehealth: Payer: Medicare Other

## 2021-06-29 ENCOUNTER — Telehealth: Payer: Self-pay | Admitting: Cardiovascular Disease

## 2021-06-29 DIAGNOSIS — H353131 Nonexudative age-related macular degeneration, bilateral, early dry stage: Secondary | ICD-10-CM | POA: Diagnosis not present

## 2021-06-29 NOTE — Telephone Encounter (Signed)
Pt c/o of Chest Pain: STAT if CP now or developed within 24 hours  1. Are you having CP right now? No, just when he walks.  2. Are you experiencing any other symptoms (ex. SOB, nausea, vomiting, sweating)? SOB  3. How long have you been experiencing CP? 3-4 weeks  4. Is your CP continuous or coming and going? Comes and goes  5. Have you taken Nitroglycerin? Yes, 2-3 days ago ? Patient has not experienced any pain for 2-3 days.

## 2021-06-29 NOTE — Telephone Encounter (Signed)
Spoke with the pt.  Pt c/o chest pain that began 3-4 weeks ago.  Pt walks a mile a day regularly and 3-4 weeks ago began having chest pain and shortness of breath during walk.  Chest pain resolves usually after he sits down and rests.  Pt has taken SL NTG x 1 only. But CP only resolves with rest per pt.  Pt reports chest pain has incr in frequency with walking over past few days.  Denies chest pain or any other symptoms at time of this call.   Pt w/ h/o CAD s/p PCI and CABG 2008 / h/o A flutter.  Last ov with Dr. Rockey Situ 03/19/21 and no s/s chest pain at visit.   BP/HR at time of call 143/76 68 Pt does report his HR this morning was 108 via pulse ox.  Pt does not feel that he is in a fib/flutter and states he usually can tell, however, his HR is "usually in the 60s."  Med list reviewed with pt.  Pt has not missed any doses of Eliquis or Metoprolol Succinate.   Pt does feel stable at this time.  Scheduled pt to see Dr. Rockey Situ first available which is 07/05/21 at 11 AM.  Advised pt to monitor s/s and reviewed instructions to take SL NTG for recurrent chest pain: If chest pain remains after 3rd dose of NTG and or feels unstable, to call 911.  Advised to also let us know if HR consistently elevated prior to next visit.  Pt appreciative and voiced understanding and has no further questions at this time.

## 2021-07-04 NOTE — Progress Notes (Signed)
Date:  07/05/2021   ID:  Rodney Delacruz, DOB 03-05-1934, MRN [id number]  Patient Location:  [address] [place] [address]   Provider location:   [facility], [place] office  PCP:  [name], MD  Cardiologist:  Arvid Right [facility]  Chief Complaint  Patient presents with   Chest Pain    Patient c/o severe chest pain & shortness of breath with walking. Medications reviewed by the patient verbally.     History of Present Illness:   [name] is a 87 yo male with  past medical history of coronary artery disease, prior PCI and bypass surgery in 2008 at [facility],  presenting to [facility] for chest pain on 10/30/2012.  ruled out for MI  Myoview study that showed no ischemia.  GERD.   prior smoking history, stopped in the 70s,  no diabetes He presents today for routine followup of his coronary artery disease, atrial flutter  LOV: 03/2021 In follow-up today, reports having angina sx, worse with walking Walks 1 mile a day, typically no sx Now with chest burning after half a mile, has to stop wait till chest pain resolves Starts hurting again walking back to his car Symptoms progressive over the past several weeks Hands bilaterally hurt a little bit when he has these episodes Concerned about blockages  History of statin intolerance  Denies any tachycardia concerning for recurrent atrial fib flutter 12/23/2020 in the office , new atrial flutter with controlled ventricular response.   started on Eliquis 5 mg twice daily Cardioversion 02/23/21, back in NSR  Labs reviewed: Total chol 174 LDL 106, before praluent Significant improvement in follow-up, now at goal  EKG personally reviewed by myself on todays visit Nsr rate 63 bpm no ST or T wave changes PVCs  Other past medical history reviewed Prior statin myalgias on Crestor, Lipitor After missing his Zetia in the past  Last cardiac catheterization 2009  Stress test February 2014  Past  Medical History:  Diagnosis Date   Allergy    Anxiety    Coronary artery disease    Depression    Elevated PSA    Followed by Dr. Rosana Berger every 6 month   Heart murmur    Hyperlipidemia    Hypertension    MI (myocardial infarction) [facility]) 2009   Ulcer    Past Surgical History:  Procedure Laterality Date   CARDIAC CATHETERIZATION  2005, 2009   CARDIOVERSION N/A 02/23/2021   Procedure: CARDIOVERSION;  Surgeon: Minna Merritts, MD;  Location: [facility] ORS;  Service: Cardiovascular;  Laterality: N/A;   CHOLECYSTECTOMY  2001   CORONARY ANGIOPLASTY  2005   s/p stent placement @ [facility]   CORONARY ARTERY BYPASS GRAFT  2009   [facility]   TONSILLECTOMY AND ADENOIDECTOMY  1942     Current Meds  Medication Sig   acetaminophen (TYLENOL) 500 MG tablet Take 1,000 mg by mouth every 6 (six) hours as needed for moderate pain or headache.   apixaban (ELIQUIS) 5 MG TABS tablet Take 1 tablet (5 mg total) by mouth 2 (two) times daily.   fluticasone (FLONASE) 50 MCG/ACT nasal spray SPRAY TWO SPRAYS IN EACH NOSTRIL ONCE DAILY (Patient taking differently: 1 spray daily as needed for allergies.)   Multiple Vitamin (MULTI-VITAMIN DAILY PO) Take 1 tablet by mouth daily.   omeprazole (PRILOSEC) 20 MG capsule Take 20 mg by mouth every other day.   PRALUENT 75 MG/ML SOAJ INJECT 75MG  INTO THE SKIN EVERY 14  DAYS   ramipril (ALTACE) 10 MG capsule TAKE ONE CAPSULE BY MOUTH DAILY   sildenafil (VIAGRA) 50 MG tablet TAKE 1-2 TABLETS BY MOUTH DAILY AS NEEDED FOR ERECTILE DYSFUNCTION   [DISCONTINUED] amLODipine (NORVASC) 10 MG tablet Take 10 mg by mouth every other day.   [DISCONTINUED] metoprolol succinate (TOPROL-XL) 25 MG 24 hr tablet TAKE ONE TABLET BY MOUTH DAILY   [DISCONTINUED] nitroGLYCERIN (NITROSTAT) 0.4 MG SL tablet PLACE 1 TABLET UNDER THE TONGUE EVERY 5 MINUTES AS NEEDED FOR CHEST PAIN. MAXIMUM OF 3 DOSES. (Patient taking differently: Place 0.4 mg under the tongue every 5 (five) minutes as needed for chest pain.)      Allergies:   Plavix [clopidogrel bisulfate]   Social History   Tobacco Use   Smoking status: Former    Packs/day: 1.00    Years: 20.00    Pack years: 20.00    Types: Cigarettes    Quit date: 08/15/1976    Years since quitting: 44.9   Smokeless tobacco: Never  Vaping Use   Vaping Use: Never used  Substance Use Topics   Alcohol use: Yes    Alcohol/week: 0.0 standard drinks    Comment: Occasional   Drug use: No     Family Hx: The patient's family history includes Alcohol abuse in his father and paternal grandfather; Diabetes in his sister; Heart attack in his brother; Heart disease in his brother; Stroke in his mother.  ROS:   Please see the history of present illness.    Review of Systems  Constitutional: Negative.   HENT: Negative.    Respiratory: Negative.    Cardiovascular: Negative.   Gastrointestinal: Negative.   Musculoskeletal: Negative.   Neurological: Negative.   Psychiatric/Behavioral: Negative.    All other systems reviewed and are negative.   Labs/Other Tests and Data Reviewed:    Recent Labs: 12/21/2020: TSH 1.99 02/19/2021: Hemoglobin 12.1; Platelets 275 06/15/2021: ALT 12; BUN 20; Creatinine, Ser 1.27; Potassium 4.7; Sodium 139   Recent Lipid Panel Lab Results  Component Value Date/Time   CHOL 115 06/15/2021 10:58 AM   CHOL 148 10/01/2012 01:05 PM   TRIG 90.0 06/15/2021 10:58 AM   TRIG 89 10/01/2012 01:05 PM   HDL 47.00 06/15/2021 10:58 AM   HDL 49 10/01/2012 01:05 PM   CHOLHDL 2 06/15/2021 10:58 AM   LDLCALC 50 06/15/2021 10:58 AM   LDLCALC 81 10/01/2012 01:05 PM   LDLDIRECT 124.0 04/23/2019 08:28 AM    Wt Readings from Last 3 Encounters:  07/05/21 179 lb (81.2 kg)  06/15/21 177 lb 3.2 oz (80.4 kg)  03/19/21 175 lb (79.4 kg)     Exam:    BP (!) 130/50 (BP Location: Left Arm, Patient Position: Sitting, Cuff Size: Normal)   Pulse 63   Ht 6' (1.829 m)   Wt 179 lb (81.2 kg)   SpO2 98%   BMI 24.28 kg/m  Constitutional:  oriented to  person, place, and time. No distress.  HENT:  Head: Grossly normal Eyes:  no discharge. No scleral icterus.  Neck: No JVD, no carotid bruits  Cardiovascular: Regular rate and rhythm, no murmurs appreciated Pulmonary/Chest: Clear to auscultation bilaterally, no wheezes or rails Abdominal: Soft.  no distension.  no tenderness.  Musculoskeletal: Normal range of motion Neurological:  normal muscle tone. Coordination normal. No atrophy Skin: Skin warm and dry Psychiatric: normal affect, pleasant   ASSESSMENT & PLAN:    Atrial flutter Recent cardioversion, maintaining normal sinus rhythm Has been on Eliquis, we will hold this  in preparation for cardiac catheterization in 2 days time Continue metoprolol succinate 25 daily  Atherosclerosis of native coronary artery of native heart with stable angina pectoris ([place]) Having worsening anginal symptoms over the past several weeks We have recommended cardiac catheterization given severity of his symptoms I have reviewed the risks, indications, and alternatives to cardiac catheterization, possible angioplasty, and stenting with the patient. Risks include but are not limited to bleeding, infection, vascular injury, stroke, myocardial infection, arrhythmia, kidney injury, radiation-related injury in the case of prolonged fluoroscopy use, emergency cardiac surgery, and death. The patient understands the risks of serious complication is 1-2 in 5329 with diagnostic cardiac cath and 1-2% or less with angioplasty/stenting.  -After long discussion, we will schedule him for cardiac catheterization this Wednesday, November 23 with Dr. Fletcher Anon at [facility] -Has been on Eliquis, will hold for catheterization this Wednesday Aspirin 81 mg daily  Essential hypertension Blood pressure is well controlled on today's visit. No changes made to the medications.  S/P CABG (coronary artery bypass graft) Cardiac catheterization scheduled as above   Total encounter time more  than 35 minutes  Greater than 50% was spent in counseling and coordination of care with the patient     Signed, Ida Rogue, MD  07/05/2021 1:11 PM    [place] [facility] [address] [place] [address], [place], [place] [address]

## 2021-07-04 NOTE — H&P (View-Only) (Signed)
Date:  07/05/2021   ID:  Rodney Delacruz, DOB May 01, 1934, MRN [id number]  Patient Location:  [address] [place] [address]   Provider location:   [facility], [place] office  PCP:  [name], MD  Cardiologist:  Arvid Right [facility]  Chief Complaint  Patient presents with   Chest Pain    Patient c/o severe chest pain & shortness of breath with walking. Medications reviewed by the patient verbally.     History of Present Illness:   Rodney Delacruz is a 87 yo male with  past medical history of coronary artery disease, prior PCI and bypass surgery in 2008 at [facility],  presenting to [facility] for chest pain on 10/30/2012.  ruled out for MI  Myoview study that showed no ischemia.  GERD.   prior smoking history, stopped in the 70s,  no diabetes He presents today for routine followup of his coronary artery disease, atrial flutter  LOV: 03/2021 In follow-up today, reports having angina sx, worse with walking Walks 1 mile a day, typically no sx Now with chest burning after half a mile, has to stop wait till chest pain resolves Starts hurting again walking back to his car Symptoms progressive over the past several weeks Hands bilaterally hurt a little bit when he has these episodes Concerned about blockages  History of statin intolerance  Denies any tachycardia concerning for recurrent atrial fib flutter 12/23/2020 in the office , new atrial flutter with controlled ventricular response.   started on Eliquis 5 mg twice daily Cardioversion 02/23/21, back in NSR  Labs reviewed: Total chol 174 LDL 106, before praluent Significant improvement in follow-up, now at goal  EKG personally reviewed by myself on todays visit Nsr rate 63 bpm no ST or T wave changes PVCs  Other past medical history reviewed Prior statin myalgias on Crestor, Lipitor After missing his Zetia in the past  Last cardiac catheterization 2009  Stress test February 2014  Past  Medical History:  Diagnosis Date   Allergy    Anxiety    Coronary artery disease    Depression    Elevated PSA    Followed by Dr. Rosana Berger every 6 month   Heart murmur    Hyperlipidemia    Hypertension    MI (myocardial infarction) [facility]) 2009   Ulcer    Past Surgical History:  Procedure Laterality Date   CARDIAC CATHETERIZATION  2005, 2009   CARDIOVERSION N/A 02/23/2021   Procedure: CARDIOVERSION;  Surgeon: Minna Merritts, MD;  Location: [facility] ORS;  Service: Cardiovascular;  Laterality: N/A;   CHOLECYSTECTOMY  2001   CORONARY ANGIOPLASTY  2005   s/p stent placement @ [facility]   CORONARY ARTERY BYPASS GRAFT  2009   [facility]   TONSILLECTOMY AND ADENOIDECTOMY  1942     Current Meds  Medication Sig   acetaminophen (TYLENOL) 500 MG tablet Take 1,000 mg by mouth every 6 (six) hours as needed for moderate pain or headache.   apixaban (ELIQUIS) 5 MG TABS tablet Take 1 tablet (5 mg total) by mouth 2 (two) times daily.   fluticasone (FLONASE) 50 MCG/ACT nasal spray SPRAY TWO SPRAYS IN EACH NOSTRIL ONCE DAILY (Patient taking differently: 1 spray daily as needed for allergies.)   Multiple Vitamin (MULTI-VITAMIN DAILY PO) Take 1 tablet by mouth daily.   omeprazole (PRILOSEC) 20 MG capsule Take 20 mg by mouth every other day.   PRALUENT 75 MG/ML SOAJ INJECT 75MG  INTO THE SKIN EVERY 14  DAYS   ramipril (ALTACE) 10 MG capsule TAKE ONE CAPSULE BY MOUTH DAILY   sildenafil (VIAGRA) 50 MG tablet TAKE 1-2 TABLETS BY MOUTH DAILY AS NEEDED FOR ERECTILE DYSFUNCTION   [DISCONTINUED] amLODipine (NORVASC) 10 MG tablet Take 10 mg by mouth every other day.   [DISCONTINUED] metoprolol succinate (TOPROL-XL) 25 MG 24 hr tablet TAKE ONE TABLET BY MOUTH DAILY   [DISCONTINUED] nitroGLYCERIN (NITROSTAT) 0.4 MG SL tablet PLACE 1 TABLET UNDER THE TONGUE EVERY 5 MINUTES AS NEEDED FOR CHEST PAIN. MAXIMUM OF 3 DOSES. (Patient taking differently: Place 0.4 mg under the tongue every 5 (five) minutes as needed for chest pain.)      Allergies:   Plavix [clopidogrel bisulfate]   Social History   Tobacco Use   Smoking status: Former    Packs/day: 1.00    Years: 20.00    Pack years: 20.00    Types: Cigarettes    Quit date: 08/15/1976    Years since quitting: 44.9   Smokeless tobacco: Never  Vaping Use   Vaping Use: Never used  Substance Use Topics   Alcohol use: Yes    Alcohol/week: 0.0 standard drinks    Comment: Occasional   Drug use: No     Family Hx: The patient's family history includes Alcohol abuse in his father and paternal grandfather; Diabetes in his sister; Heart attack in his brother; Heart disease in his brother; Stroke in his mother.  ROS:   Please see the history of present illness.    Review of Systems  Constitutional: Negative.   HENT: Negative.    Respiratory: Negative.    Cardiovascular: Negative.   Gastrointestinal: Negative.   Musculoskeletal: Negative.   Neurological: Negative.   Psychiatric/Behavioral: Negative.    All other systems reviewed and are negative.   Labs/Other Tests and Data Reviewed:    Recent Labs: 12/21/2020: TSH 1.99 02/19/2021: Hemoglobin 12.1; Platelets 275 06/15/2021: ALT 12; BUN 20; Creatinine, Ser 1.27; Potassium 4.7; Sodium 139   Recent Lipid Panel Lab Results  Component Value Date/Time   CHOL 115 06/15/2021 10:58 AM   CHOL 148 10/01/2012 01:05 PM   TRIG 90.0 06/15/2021 10:58 AM   TRIG 89 10/01/2012 01:05 PM   HDL 47.00 06/15/2021 10:58 AM   HDL 49 10/01/2012 01:05 PM   CHOLHDL 2 06/15/2021 10:58 AM   LDLCALC 50 06/15/2021 10:58 AM   LDLCALC 81 10/01/2012 01:05 PM   LDLDIRECT 124.0 04/23/2019 08:28 AM    Wt Readings from Last 3 Encounters:  07/05/21 179 lb (81.2 kg)  06/15/21 177 lb 3.2 oz (80.4 kg)  03/19/21 175 lb (79.4 kg)     Exam:    BP (!) 130/50 (BP Location: Left Arm, Patient Position: Sitting, Cuff Size: Normal)   Pulse 63   Ht 6' (1.829 m)   Wt 179 lb (81.2 kg)   SpO2 98%   BMI 24.28 kg/m  Constitutional:  oriented to  person, place, and time. No distress.  HENT:  Head: Grossly normal Eyes:  no discharge. No scleral icterus.  Neck: No JVD, no carotid bruits  Cardiovascular: Regular rate and rhythm, no murmurs appreciated Pulmonary/Chest: Clear to auscultation bilaterally, no wheezes or rails Abdominal: Soft.  no distension.  no tenderness.  Musculoskeletal: Normal range of motion Neurological:  normal muscle tone. Coordination normal. No atrophy Skin: Skin warm and dry Psychiatric: normal affect, pleasant   ASSESSMENT & PLAN:    Atrial flutter Recent cardioversion, maintaining normal sinus rhythm Has been on Eliquis, we will hold this  in preparation for cardiac catheterization in 2 days time Continue metoprolol succinate 25 daily  Atherosclerosis of native coronary artery of native heart with stable angina pectoris ([place]) Having worsening anginal symptoms over the past several weeks We have recommended cardiac catheterization given severity of his symptoms I have reviewed the risks, indications, and alternatives to cardiac catheterization, possible angioplasty, and stenting with the patient. Risks include but are not limited to bleeding, infection, vascular injury, stroke, myocardial infection, arrhythmia, kidney injury, radiation-related injury in the case of prolonged fluoroscopy use, emergency cardiac surgery, and death. The patient understands the risks of serious complication is 1-2 in 1638 with diagnostic cardiac cath and 1-2% or less with angioplasty/stenting.  -After long discussion, we will schedule him for cardiac catheterization this Wednesday, November 23 with Dr. Fletcher Anon at [facility] -Has been on Eliquis, will hold for catheterization this Wednesday Aspirin 81 mg daily  Essential hypertension Blood pressure is well controlled on today's visit. No changes made to the medications.  S/P CABG (coronary artery bypass graft) Cardiac catheterization scheduled as above   Total encounter time more  than 35 minutes  Greater than 50% was spent in counseling and coordination of care with the patient     Signed, Ida Rogue, MD  07/05/2021 1:11 PM    [place] [facility] [address] [place] [address], [place], [place] [address]

## 2021-07-05 ENCOUNTER — Ambulatory Visit (INDEPENDENT_AMBULATORY_CARE_PROVIDER_SITE_OTHER): Payer: Medicare Other | Admitting: Cardiovascular Disease

## 2021-07-05 ENCOUNTER — Other Ambulatory Visit: Payer: Self-pay

## 2021-07-05 ENCOUNTER — Telehealth: Payer: Self-pay

## 2021-07-05 ENCOUNTER — Encounter: Payer: Self-pay | Admitting: Cardiovascular Disease

## 2021-07-05 VITALS — BP 130/50 | HR 63 | Ht 72.0 in | Wt 179.0 lb

## 2021-07-05 DIAGNOSIS — Z951 Presence of aortocoronary bypass graft: Secondary | ICD-10-CM

## 2021-07-05 DIAGNOSIS — I2510 Atherosclerotic heart disease of native coronary artery without angina pectoris: Secondary | ICD-10-CM | POA: Diagnosis not present

## 2021-07-05 DIAGNOSIS — E785 Hyperlipidemia, unspecified: Secondary | ICD-10-CM

## 2021-07-05 DIAGNOSIS — I483 Typical atrial flutter: Secondary | ICD-10-CM | POA: Diagnosis not present

## 2021-07-05 DIAGNOSIS — E782 Mixed hyperlipidemia: Secondary | ICD-10-CM

## 2021-07-05 DIAGNOSIS — I10 Essential (primary) hypertension: Secondary | ICD-10-CM | POA: Diagnosis not present

## 2021-07-05 DIAGNOSIS — I25118 Atherosclerotic heart disease of native coronary artery with other forms of angina pectoris: Secondary | ICD-10-CM | POA: Diagnosis not present

## 2021-07-05 DIAGNOSIS — I200 Unstable angina: Secondary | ICD-10-CM

## 2021-07-05 DIAGNOSIS — D649 Anemia, unspecified: Secondary | ICD-10-CM | POA: Diagnosis not present

## 2021-07-05 DIAGNOSIS — Z01810 Encounter for preprocedural cardiovascular examination: Secondary | ICD-10-CM

## 2021-07-05 DIAGNOSIS — M7989 Other specified soft tissue disorders: Secondary | ICD-10-CM

## 2021-07-05 DIAGNOSIS — Z01818 Encounter for other preprocedural examination: Secondary | ICD-10-CM

## 2021-07-05 MED ORDER — METOPROLOL SUCCINATE ER 25 MG PO TB24: 25.0000 mg | ORAL_TABLET | Freq: Every day | ORAL | 3 refills | Status: DC

## 2021-07-05 MED ORDER — AMLODIPINE BESYLATE 10 MG PO TABS: 10.0000 mg | ORAL_TABLET | ORAL | 3 refills | Status: DC

## 2021-07-05 MED ORDER — NITROGLYCERIN 0.4 MG SL SUBL: 0.4000 mg | SUBLINGUAL_TABLET | SUBLINGUAL | 3 refills | Status: DC | PRN

## 2021-07-05 NOTE — Telephone Encounter (Signed)
Dr. Rockey Situ advised to call pt to ask for him to take 81 mg ASA while off Eliquis as he has heart cath [facility] 11/23.  Reach out to Rodney Delacruz, he reports understanding, has ASA 81 mg on hand and will take once daily while off Eliquis.

## 2021-07-05 NOTE — Patient Instructions (Addendum)
Medication Instructions:  Hold Eliquis now until after heart cath Aspirin 81 mg daily  If you need a refill on your cardiac medications before your next appointment, please call your pharmacy.    Lab work: CBC  Testing/Procedures: Heart Catheterization with Dr. Fletcher Anon   Follow-Up: At [facility], you and your health needs are our priority.  As part of our continuing mission to provide you with exceptional heart care, we have created designated Provider Care Teams.  These Care Teams include your primary Cardiologist (physician) and Advanced Practice Providers (APPs -  Physician Assistants and Nurse Practitioners) who all work together to provide you with the care you need, when you need it.  You will need a follow up appointment in 1 month  Providers on your designated Care Team:   Murray Hodgkins, NP Christell Faith, PA-C Cadence Kathlen Mody, [place]   COVID-19 Vaccine Information can be found at: ShippingScam.co.uk For questions related to vaccine distribution or appointments, please email vaccine@[place] .com or call 267-794-1951.   [facility] Cardiac Cath Instructions  You are scheduled for a Cardiac Cath on: 07/07/2021  Please arrive at 08:30 am on the day of your procedure Your start time is 09:30 am Please expect a call from our [facility] to  pre-register you Do not eat/drink anything after midnight Someone will need to drive you home It is recommended someone be with you for the first 24 hours after  your procedure Wear clothes that are easy to get on/off and wear slip on shoes if  possible  Please bring a current list of all medications with you  _X__ You may take all of your medications the morning of your procedure with enough water to swallow safely  ___ DO NOT take these medications before your procedure: Lasix  ___ Please STOP Eliquis today and until after procedure  For your safety and  being able to monitor your condition, we request that they you do not wear nail polish, gel polish, artificial nails, or any other type of covering on natural nails including finger and toenails. If you have artificial nails, gel coating, etc.that requires to be removed by a nail saloon please have this removed prior to procedure. Your procedure may need to be canceled/ delayed if the performing provider/anesthesia team feels like the patient is unable to be adequately monitored.  Day of your procedure:  Arrive at the [facility] entrance.  Free valet service is available.  After entering the [place] please check-in at the registration desk (1st desk on your right) to receive your armband. After receiving your armband someone will escort you to the cardiac cath/special procedures waiting area.  The usual length of stay after your procedure is about 2 to 3 hours.  This can vary.  If you have any questions, please call our office at [phone], or you may call the cardiac cath lab at [facility] directly at [phone]  Your physician has requested that you have a cardiac catheterization. Cardiac catheterization is used to diagnose and/or treat various heart conditions. Doctors may recommend this procedure for a number of different reasons. The most common reason is to evaluate chest pain. Chest pain can be a symptom of coronary artery disease (CAD), and cardiac catheterization can show whether plaque is narrowing or blocking your heart's arteries. This procedure is also used to evaluate the valves, as well as measure the blood flow and oxygen levels in different parts of your heart. For further information please visit HugeFiesta.tn. Please follow instruction sheet,  as given.

## 2021-07-06 ENCOUNTER — Other Ambulatory Visit: Payer: Self-pay | Admitting: Cardiovascular Disease

## 2021-07-06 DIAGNOSIS — I200 Unstable angina: Secondary | ICD-10-CM

## 2021-07-06 LAB — CBC
Hematocrit: 34.5 % — ABNORMAL LOW (ref 37.5–51.0)
Hemoglobin: 11.3 g/dL — ABNORMAL LOW (ref 13.0–17.7)
MCH: 32.3 pg (ref 26.6–33.0)
MCHC: 32.8 g/dL (ref 31.5–35.7)
MCV: 99 fL — ABNORMAL HIGH (ref 79–97)
Platelets: 269 10*3/uL (ref 150–450)
RBC: 3.5 x10E6/uL — ABNORMAL LOW (ref 4.14–5.80)
RDW: 12.3 % (ref 11.6–15.4)
WBC: 8.5 10*3/uL (ref 3.4–10.8)

## 2021-07-07 ENCOUNTER — Encounter: Payer: Self-pay | Admitting: Cardiovascular Disease

## 2021-07-07 ENCOUNTER — Other Ambulatory Visit: Payer: Self-pay

## 2021-07-07 ENCOUNTER — Encounter
Admission: RE | Disposition: A | Discharge status: Home / Self Care | Payer: Self-pay | Source: Home / Self Care | Attending: Cardiovascular Disease

## 2021-07-07 ENCOUNTER — Ambulatory Visit
Admission: RE | Admit: 2021-07-07 | Discharge: 2021-07-07 | Disposition: A | Discharge status: Home / Self Care | Payer: Medicare Other | Attending: Cardiovascular Disease | Admitting: Cardiovascular Disease

## 2021-07-07 DIAGNOSIS — Z951 Presence of aortocoronary bypass graft: Secondary | ICD-10-CM | POA: Diagnosis not present

## 2021-07-07 DIAGNOSIS — I2582 Chronic total occlusion of coronary artery: Secondary | ICD-10-CM | POA: Insufficient documentation

## 2021-07-07 DIAGNOSIS — K219 Gastro-esophageal reflux disease without esophagitis: Secondary | ICD-10-CM | POA: Insufficient documentation

## 2021-07-07 DIAGNOSIS — I25110 Atherosclerotic heart disease of native coronary artery with unstable angina pectoris: Secondary | ICD-10-CM | POA: Diagnosis not present

## 2021-07-07 DIAGNOSIS — Z87891 Personal history of nicotine dependence: Secondary | ICD-10-CM | POA: Diagnosis not present

## 2021-07-07 DIAGNOSIS — I10 Essential (primary) hypertension: Secondary | ICD-10-CM | POA: Insufficient documentation

## 2021-07-07 DIAGNOSIS — Z7982 Long term (current) use of aspirin: Secondary | ICD-10-CM | POA: Insufficient documentation

## 2021-07-07 DIAGNOSIS — I200 Unstable angina: Secondary | ICD-10-CM

## 2021-07-07 DIAGNOSIS — I4892 Unspecified atrial flutter: Secondary | ICD-10-CM | POA: Diagnosis not present

## 2021-07-07 DIAGNOSIS — Z955 Presence of coronary angioplasty implant and graft: Secondary | ICD-10-CM | POA: Insufficient documentation

## 2021-07-07 DIAGNOSIS — Z7901 Long term (current) use of anticoagulants: Secondary | ICD-10-CM | POA: Insufficient documentation

## 2021-07-07 HISTORY — PX: LEFT HEART CATH AND CORS/GRAFTS ANGIOGRAPHY: CATH118250

## 2021-07-07 SURGERY — LEFT HEART CATH AND CORS/GRAFTS ANGIOGRAPHY
Anesthesia: Moderate Sedation | Laterality: Left

## 2021-07-07 SURGERY — LEFT HEART CATH AND CORONARY ANGIOGRAPHY
Anesthesia: Moderate Sedation

## 2021-07-07 MED ORDER — MIDAZOLAM HCL 2 MG/2ML IJ SOLN
INTRAMUSCULAR | Status: DC | PRN
  Administered 2021-07-07: 1 mg via INTRAVENOUS

## 2021-07-07 MED ORDER — HEPARIN SODIUM (PORCINE) 1000 UNIT/ML IJ SOLN
INTRAMUSCULAR | Status: AC
  Filled 2021-07-07: qty 1

## 2021-07-07 MED ORDER — HEPARIN (PORCINE) IN NACL 1000-0.9 UT/500ML-% IV SOLN
INTRAVENOUS | Status: DC | PRN
  Administered 2021-07-07: 1000 mL

## 2021-07-07 MED ORDER — HEPARIN SODIUM (PORCINE) 1000 UNIT/ML IJ SOLN
INTRAMUSCULAR | Status: DC | PRN
  Administered 2021-07-07: 4000 [IU] via INTRAVENOUS

## 2021-07-07 MED ORDER — LIDOCAINE HCL (PF) 1 % IJ SOLN
INTRAMUSCULAR | Status: DC | PRN
  Administered 2021-07-07: 2 mL

## 2021-07-07 MED ORDER — VERAPAMIL HCL 2.5 MG/ML IV SOLN
INTRAVENOUS | Status: DC | PRN
  Administered 2021-07-07: 2.5 mg via INTRA_ARTERIAL

## 2021-07-07 MED ORDER — SODIUM CHLORIDE 0.9% FLUSH: 3.0000 mL | Freq: Two times a day (BID) | INTRAVENOUS | Status: DC

## 2021-07-07 MED ORDER — IOHEXOL 350 MG/ML SOLN
INTRAVENOUS | Status: DC | PRN
  Administered 2021-07-07: 58 mL

## 2021-07-07 MED ORDER — SODIUM CHLORIDE 0.9 % IV SOLN: 250.0000 mL | INTRAVENOUS | Status: DC | PRN

## 2021-07-07 MED ORDER — SODIUM CHLORIDE 0.9% FLUSH: 3.0000 mL | INTRAVENOUS | Status: DC | PRN

## 2021-07-07 MED ORDER — ASPIRIN 81 MG PO CHEW
CHEWABLE_TABLET | ORAL | Status: AC
  Filled 2021-07-07: qty 1

## 2021-07-07 MED ORDER — SODIUM CHLORIDE 0.9 % IV SOLN: INTRAVENOUS | Status: DC

## 2021-07-07 MED ORDER — ACETAMINOPHEN 325 MG PO TABS: 650.0000 mg | ORAL_TABLET | ORAL | Status: DC | PRN

## 2021-07-07 MED ORDER — SODIUM CHLORIDE 0.9 % WEIGHT BASED INFUSION
1.0000 mL/kg/h | INTRAVENOUS | Status: DC
  Administered 2021-07-07: 1 mL/kg/h via INTRAVENOUS

## 2021-07-07 MED ORDER — HEPARIN (PORCINE) IN NACL 1000-0.9 UT/500ML-% IV SOLN
INTRAVENOUS | Status: AC
  Filled 2021-07-07: qty 1000

## 2021-07-07 MED ORDER — ASPIRIN 81 MG PO CHEW
81.0000 mg | CHEWABLE_TABLET | ORAL | Status: AC
  Administered 2021-07-07: 81 mg via ORAL

## 2021-07-07 MED ORDER — FENTANYL CITRATE (PF) 100 MCG/2ML IJ SOLN
INTRAMUSCULAR | Status: DC | PRN
  Administered 2021-07-07: 25 ug via INTRAVENOUS

## 2021-07-07 MED ORDER — SODIUM CHLORIDE 0.9 % WEIGHT BASED INFUSION
3.0000 mL/kg/h | INTRAVENOUS | Status: AC
  Administered 2021-07-07: 3 mL/kg/h via INTRAVENOUS

## 2021-07-07 MED ORDER — FENTANYL CITRATE (PF) 100 MCG/2ML IJ SOLN
INTRAMUSCULAR | Status: AC
  Filled 2021-07-07: qty 2

## 2021-07-07 MED ORDER — ONDANSETRON HCL 4 MG/2ML IJ SOLN: 4.0000 mg | Freq: Four times a day (QID) | INTRAMUSCULAR | Status: DC | PRN

## 2021-07-07 MED ORDER — VERAPAMIL HCL 2.5 MG/ML IV SOLN
INTRAVENOUS | Status: AC
  Filled 2021-07-07: qty 2

## 2021-07-07 MED ORDER — LIDOCAINE HCL 1 % IJ SOLN
INTRAMUSCULAR | Status: AC
  Filled 2021-07-07: qty 20

## 2021-07-07 MED ORDER — MIDAZOLAM HCL 2 MG/2ML IJ SOLN
INTRAMUSCULAR | Status: AC
  Filled 2021-07-07: qty 2

## 2021-07-07 SURGICAL SUPPLY — 12 items
CATH 5F 110X4 TIG (CATHETERS) ×2 IMPLANT
CATH INFINITI 5 FR IM (CATHETERS) ×2 IMPLANT
DEVICE RAD TR BAND REGULAR (VASCULAR PRODUCTS) ×2 IMPLANT
DRAPE BRACHIAL (DRAPES) ×2 IMPLANT
GLIDESHEATH SLEND SS 6F .021 (SHEATH) ×2 IMPLANT
GUIDEWIRE INQWIRE 1.5J.035X260 (WIRE) ×1 IMPLANT
INQWIRE 1.5J .035X260CM (WIRE) ×2
PACK CARDIAC CATH (CUSTOM PROCEDURE TRAY) ×2 IMPLANT
PROTECTION STATION PRESSURIZED (MISCELLANEOUS) ×2
SET ATX SIMPLICITY (MISCELLANEOUS) ×2 IMPLANT
STATION PROTECTION PRESSURIZED (MISCELLANEOUS) ×1 IMPLANT
WIRE NITINOL .018 (WIRE) ×2 IMPLANT

## 2021-07-07 NOTE — Interval H&P Note (Signed)
History and Physical Interval Note:  07/07/2021 10:37 AM  Rodney Delacruz  has presented today for surgery, with the diagnosis of LT Cath w graft and possible PCI     Angina.  The various methods of treatment have been discussed with the patient and family. After consideration of risks, benefits and other options for treatment, the patient has consented to  Procedure(s): LEFT HEART CATH AND CORS/GRAFTS ANGIOGRAPHY (Left) as a surgical intervention.  The patient's history has been reviewed, patient examined, no change in status, stable for surgery.  I have reviewed the patient's chart and labs.  Questions were answered to the patient's satisfaction.     [name]

## 2021-07-13 ENCOUNTER — Other Ambulatory Visit: Payer: Self-pay | Admitting: Family Medicine

## 2021-07-20 ENCOUNTER — Ambulatory Visit: Payer: Medicare Other | Admitting: Cardiovascular Disease

## 2021-07-20 DIAGNOSIS — L570 Actinic keratosis: Secondary | ICD-10-CM | POA: Diagnosis not present

## 2021-07-20 DIAGNOSIS — C44622 Squamous cell carcinoma of skin of right upper limb, including shoulder: Secondary | ICD-10-CM | POA: Diagnosis not present

## 2021-07-20 DIAGNOSIS — C44311 Basal cell carcinoma of skin of nose: Secondary | ICD-10-CM | POA: Diagnosis not present

## 2021-07-20 DIAGNOSIS — L209 Atopic dermatitis, unspecified: Secondary | ICD-10-CM | POA: Diagnosis not present

## 2021-07-20 DIAGNOSIS — L988 Other specified disorders of the skin and subcutaneous tissue: Secondary | ICD-10-CM | POA: Diagnosis not present

## 2021-07-26 ENCOUNTER — Other Ambulatory Visit: Payer: Self-pay | Admitting: Family Medicine

## 2021-07-26 DIAGNOSIS — I2510 Atherosclerotic heart disease of native coronary artery without angina pectoris: Secondary | ICD-10-CM

## 2021-07-29 DIAGNOSIS — C44622 Squamous cell carcinoma of skin of right upper limb, including shoulder: Secondary | ICD-10-CM | POA: Diagnosis not present

## 2021-07-29 DIAGNOSIS — C44311 Basal cell carcinoma of skin of nose: Secondary | ICD-10-CM | POA: Diagnosis not present

## 2021-07-30 ENCOUNTER — Ambulatory Visit (INDEPENDENT_AMBULATORY_CARE_PROVIDER_SITE_OTHER): Payer: Medicare Other | Admitting: Pharmacist

## 2021-07-30 ENCOUNTER — Telehealth: Payer: Self-pay | Admitting: Family Medicine

## 2021-07-30 ENCOUNTER — Telehealth: Payer: Self-pay | Admitting: Pharmacist

## 2021-07-30 DIAGNOSIS — Z951 Presence of aortocoronary bypass graft: Secondary | ICD-10-CM

## 2021-07-30 DIAGNOSIS — E782 Mixed hyperlipidemia: Secondary | ICD-10-CM

## 2021-07-30 DIAGNOSIS — I10 Essential (primary) hypertension: Secondary | ICD-10-CM

## 2021-07-30 DIAGNOSIS — I2510 Atherosclerotic heart disease of native coronary artery without angina pectoris: Secondary | ICD-10-CM

## 2021-07-30 NOTE — Patient Instructions (Signed)
Visit Information  Following are the goals we discussed today:  Patient Goals/Self-Care Activities Over the next 90 days, patient will:  - take medications as prescribed collaborate with provider on medication access solutions        Plan: Telephone follow up appointment with care management team member scheduled for:  pending Praluent access   Catie Darnelle Maffucci, PharmD, [place], CPP Clinical Pharmacist [place] at [facility] [phone]     Please call the care guide team at [phone] if you need to cancel or reschedule your appointment.   Patient verbalizes understanding of instructions provided today and agrees to view in [place].

## 2021-07-30 NOTE — Telephone Encounter (Signed)
Returned call. See CCM documentation 

## 2021-07-30 NOTE — Chronic Care Management (AMB) (Signed)
Chronic Care Management CCM Pharmacy Note  07/30/2021 Name:  Rodney Delacruz MRN:  [id number] DOB:  07/26/1934  Summary: - Reports cost concerns with Eliquis, Praluent  Recommendations/Changes made from today's visit: - Assisted in [facility] application for [place] today. Will follow for eligibility for Eliquis assistance in 2023  Subjective: Rodney Delacruz is an 87 y.o. year old male who is a primary patient of Caryl Bis, Angela Adam, MD.  The CCM team was consulted for assistance with disease management and care coordination needs.    Engaged with patient by telephone for follow up visit for pharmacy case management and/or care coordination services.   Objective:  Medications Reviewed Today     Reviewed by De Hollingshead, RPH-CPP (Pharmacist) on 07/30/21 at 30  Med List Status: <None>   Medication Order Taking? Sig Documenting Provider Last Dose Status Informant  acetaminophen (TYLENOL) 500 MG tablet [id number] Yes Take 1,000 mg by mouth every 6 (six) hours as needed for moderate pain or headache. [provider] Taking Active Self  amLODipine (NORVASC) 10 MG tablet [id number] Yes Take 1 tablet (10 mg total) by mouth every other day. Minna Merritts, MD Taking Active Self  apixaban (ELIQUIS) 5 MG TABS tablet [id number] Yes Take 1 tablet (5 mg total) by mouth 2 (two) times daily. Minna Merritts, MD Taking Active Self  diphenhydrAMINE (BENADRYL) 25 MG tablet [id number]  Take 25 mg by mouth daily as needed for allergies. [provider]  Active Self  fluticasone (FLONASE) 50 MCG/ACT nasal spray [id number] Yes SPRAY TWO SPRAYS IN EACH NOSTRIL ONCE DAILY  Patient taking differently: 1 spray daily as needed for allergies.   [name], MD Taking Active Self  metoprolol succinate (TOPROL-XL) 25 MG 24 hr tablet [id number] Yes Take 1 tablet (25 mg total) by mouth daily. Minna Merritts, MD Taking Active Self  Multiple Vitamin (MULTI-VITAMIN DAILY PO)  [id number] Yes Take 1 tablet by mouth daily. [provider] Taking Active Self  nitroGLYCERIN (NITROSTAT) 0.4 MG SL tablet [id number]  Place 1 tablet (0.4 mg total) under the tongue every 5 (five) minutes as needed for chest pain. Minna Merritts, MD  Active Self  omeprazole (PRILOSEC) 20 MG capsule [id number] Yes Take 20 mg by mouth every other day. [provider] Taking Active Self  Polyethyl Glycol-Propyl Glycol (SYSTANE OP) [id number]  Place 1 drop into both eyes daily. [provider]  Active Self  PRALUENT 75 MG/ML Darden Palmer [id number] Yes INJECT 75MG  (1ML) INTO THE SKIN EVERY 14 DAYS [name], MD Taking Active   ramipril (ALTACE) 10 MG capsule [id number] Yes TAKE ONE CAPSULE BY MOUTH DAILY Rockey Situ Kathlene November, MD Taking Active Self  sildenafil (VIAGRA) 50 MG tablet [id number] Yes TAKE 1 TO 2 TABLETS BY MOUTH DAILY AS NEEDED FOR ERECTILE DYSFUNCTION [name], MD Taking Active             Pertinent Labs:   Lab Results  Component Value Date   HGBA1C 5.8 12/24/2019   Lab Results  Component Value Date   CHOL 115 06/15/2021   HDL 47.00 06/15/2021   LDLCALC 50 06/15/2021   LDLDIRECT 124.0 04/23/2019   TRIG 90.0 06/15/2021   CHOLHDL 2 06/15/2021   Lab Results  Component Value Date   CREATININE 1.27 06/15/2021   BUN 20 06/15/2021   NA 139 06/15/2021   K 4.7 06/15/2021   CL 106 06/15/2021   CO2 27 06/15/2021    SDOH:  (Social Determinants  of Health) assessments and interventions performed:  SDOH Interventions    Flowsheet Row Most Recent Value  SDOH Interventions   Financial Strain Interventions Other (Comment)  [[facility]well [facility]]       CCM Care Plan  Review of patient past medical history, allergies, medications, health status, including review of consultants reports, laboratory and other test data, was performed as part of comprehensive evaluation and provision of chronic care management services.   Care Plan : Medication  Management  Updates made by De Hollingshead, RPH-CPP since 07/30/2021 12:00 AM     Problem: HLD, Atrial Flutter, CAD      Long-Range Goal: Disease Progression Prevention   Start Date: 01/05/2021  Recent Progress: On track  Priority: High  Note:   Current Barriers:  Unable to achieve control of cholesterol  Unable to afford medication regimen  Pharmacist Clinical Goal(s):  Over the next 90 days, patient will achieve control of cholesterol through collaboration with PharmD and provider.   Interventions: 1:1 collaboration with [name], MD regarding development and update of comprehensive plan of care as evidenced by provider attestation and co-signature Inter-disciplinary care team collaboration (see longitudinal plan of care) Comprehensive medication review performed; medication list updated in electronic medical record  Hypertension, Atrial Flutter: Appropriately managed; current treatment: rate control: metoprolol succinate 25 mg daily; anticoagulant: Eliquis 5 mg BID  S/p cardioversion Additional antihypertensives: ramipril 10 mg daily, amlodipine 10 mg every other day Reports he is in the Coverage Gap and having a difficult time affording Eliquis. Discussed patient assistance. Follow out of pocket spend in 2023 to pursue assistance when eligible.  Recommend to continue current regimen at this time   Hyperlipidemia, CAD s/p revascularization: Uncontrolled; current treatment: Praluent 75 mg Q14 days Medications previously tried: atorvastatin, rosuvastatin, pravastatin, lovastatin - all resulted in significant muscle aches and pains Antiplatelet regimen: aspirin 81 mg daily Reports they never contacted [employer] for copay support. Assisted patient in completion of [employer] application today. Confirmation [id number]. Will follow for results.   GERD: Appropriately managed; current regimen: omeprazole 20 mg every other day Continue current regimen at this  time  Patient Goals/Self-Care Activities Over the next 90 days, patient will:  - take medications as prescribed collaborate with provider on medication access solutions      Plan: Telephone follow up appointment with care management team member scheduled for:  pending Praluent access  Catie Darnelle Maffucci, PharmD, [place], [place] Clinical Pharmacist [employer] at [facility] [phone]

## 2021-07-30 NOTE — Telephone Encounter (Signed)
Patient returned Catie's phone call.

## 2021-07-30 NOTE — Telephone Encounter (Signed)
°  Chronic Care Management   Note  07/30/2021 Name: Rodney Delacruz MRN: [id number] DOB: 06-Dec-1933   Attempted to contact patient for scheduled appointment for medication management support. Left HIPAA compliant message for patient to return my call at their convenience.    Plan: - If I do not hear back from the patient by end of business today, will collaborate with Care Guide to outreach to schedule follow up with me   Catie Darnelle Maffucci, PharmD, [place], [place] Pharmacist [employer] at [employer] [phone]

## 2021-08-01 NOTE — Progress Notes (Signed)
Date:  08/02/2021   ID:  [name], DOB 02/06/1934, MRN [id number]  Patient Location:  [address] [place] [address]   Provider location:   [facility], [place] office  PCP:  [name], MD  Cardiologist:  Arvid Right [facility]  Chief Complaint  Patient presents with   1 month folllow     "Doing well." Medications reviewed by the patient verbally.     History of Present Illness:   [name] is a 87 yo male with  past medical history of coronary artery disease, prior PCI and bypass surgery in 2008 at [facility],  presenting to [facility] for chest pain on 10/30/2012.  ruled out for MI  Myoview study that showed no ischemia.  GERD.   prior smoking history, stopped in the 70s,  no diabetes He presents today for routine followup of his coronary artery disease, atrial flutter  LOV: 06/2021 Recent events discussed with him Cath 07/07/21 for angina 1.  Severe underlying three-vessel and left main coronary artery disease with occluded mid left main and severe diffuse disease in the native right coronary artery.  Patent grafts including LIMA to distal LAD, SVG to OM1 and SVG to right PDA.  The mid to distal LAD has a stent that is occluded.  The proximal LAD is supplied partially by retrograde flow from SVG to OM1.  2.  Left ventricular angiography was not performed.  EF was normal by echo.  Normal left ventricular end-diastolic pressure.   Recommendations: Made for Continued medical therapy.  The patient's graft are patent with no significant disease.  He does have significant native vessel coronary artery disease likely causing some angina but there are no targets for revascularization.  Echo 6/22 Echocardiogram Normal cardiac function right and left ventricle, no significant valve disease  In follow-up today has stopped walking, no further chest pain Does not know whether to restart walking program  Blood pressure well controlled, cholesterol  well controlled  EKG personally reviewed by myself on todays visit Normal sinus rhythm rate 70 bpm PVCs   History of statin intolerance  Denies any tachycardia concerning for recurrent atrial fib flutter 12/23/2020 in the office , new atrial flutter with controlled ventricular response.   started on Eliquis 5 mg twice daily Cardioversion 02/23/21, back in [facility]  Prior statin myalgias on Crestor, Lipitor After missing his Zetia in the past  Last cardiac catheterization 2009  Stress test February 2014  Past Medical History:  Diagnosis Date   Allergy    Anxiety    Coronary artery disease    Depression    Elevated PSA    Followed by Dr. Rosana Berger every 6 month   Heart murmur    Hyperlipidemia    Hypertension    MI (myocardial infarction) [facility]) 2009   Ulcer    Past Surgical History:  Procedure Laterality Date   CARDIAC CATHETERIZATION  2005, 2009   CARDIOVERSION N/A 02/23/2021   Procedure: CARDIOVERSION;  Surgeon: Minna Merritts, MD;  Location: [facility] ORS;  Service: Cardiovascular;  Laterality: N/A;   CHOLECYSTECTOMY  2001   CORONARY ANGIOPLASTY  2005   s/p stent placement @ [facility]   CORONARY ARTERY BYPASS GRAFT  2009   [facility]   LEFT HEART CATH AND CORS/GRAFTS ANGIOGRAPHY Left 07/07/2021   Procedure: LEFT HEART CATH AND CORS/GRAFTS ANGIOGRAPHY;  Surgeon: Wellington Hampshire, MD;  Location: [place] CV LAB;  Service: Cardiovascular;  Laterality: Left;   TONSILLECTOMY AND ADENOIDECTOMY  1942     Current Meds  Medication Sig   acetaminophen (TYLENOL) 500 MG tablet Take 1,000 mg by mouth every 6 (six) hours as needed for moderate pain or headache.   amLODipine (NORVASC) 10 MG tablet Take 1 tablet (10 mg total) by mouth every other day.   apixaban (ELIQUIS) 5 MG TABS tablet Take 1 tablet (5 mg total) by mouth 2 (two) times daily.   dabigatran (PRADAXA) 150 MG CAPS capsule Take 1 capsule (150 mg total) by mouth 2 (two) times daily.   diphenhydrAMINE (BENADRYL) 25 MG tablet Take 25  mg by mouth daily as needed for allergies.   fluticasone (FLONASE) 50 MCG/ACT nasal spray SPRAY TWO SPRAYS IN EACH NOSTRIL ONCE DAILY (Patient taking differently: 1 spray daily as needed for allergies.)   isosorbide mononitrate (IMDUR) 30 MG 24 hr tablet Take 1 tablet (30 mg total) by mouth daily.   metoprolol succinate (TOPROL-XL) 25 MG 24 hr tablet Take 1 tablet (25 mg total) by mouth daily.   Multiple Vitamin (MULTI-VITAMIN DAILY PO) Take 1 tablet by mouth daily.   nitroGLYCERIN (NITROSTAT) 0.4 MG SL tablet Place 1 tablet (0.4 mg total) under the tongue every 5 (five) minutes as needed for chest pain.   omeprazole (PRILOSEC) 20 MG capsule Take 20 mg by mouth every other day.   Polyethyl Glycol-Propyl Glycol (SYSTANE OP) Place 1 drop into both eyes daily.   ramipril (ALTACE) 10 MG capsule TAKE ONE CAPSULE BY MOUTH DAILY   [DISCONTINUED] PRALUENT 75 MG/ML SOAJ INJECT 75MG  (1ML) INTO THE SKIN EVERY 14 DAYS   [DISCONTINUED] sildenafil (VIAGRA) 50 MG tablet TAKE 1 TO 2 TABLETS BY MOUTH DAILY AS NEEDED FOR ERECTILE DYSFUNCTION     Allergies:   Plavix [clopidogrel bisulfate]   Social History   Tobacco Use   Smoking status: Former    Packs/day: 1.00    Years: 20.00    Pack years: 20.00    Types: Cigarettes    Quit date: 08/15/1976    Years since quitting: 44.9   Smokeless tobacco: Never  Vaping Use   Vaping Use: Never used  Substance Use Topics   Alcohol use: Yes    Alcohol/week: 0.0 standard drinks    Comment: Occasional   Drug use: No     Family Hx: The patient's family history includes Alcohol abuse in his father and paternal grandfather; Diabetes in his sister; Heart attack in his brother; Heart disease in his brother; Stroke in his mother.  ROS:   Please see the history of present illness.    Review of Systems  Constitutional: Negative.   HENT: Negative.    Respiratory: Negative.    Cardiovascular: Negative.   Gastrointestinal: Negative.   Musculoskeletal: Negative.    Neurological: Negative.   Psychiatric/Behavioral: Negative.    All other systems reviewed and are negative.   Labs/Other Tests and Data Reviewed:    Recent Labs: 12/21/2020: TSH 1.99 06/15/2021: ALT 12; BUN 20; Creatinine, Ser 1.27; Potassium 4.7; Sodium 139 07/05/2021: Hemoglobin 11.3; Platelets 269   Recent Lipid Panel Lab Results  Component Value Date/Time   CHOL 115 06/15/2021 10:58 AM   CHOL 148 10/01/2012 01:05 PM   TRIG 90.0 06/15/2021 10:58 AM   TRIG 89 10/01/2012 01:05 PM   HDL 47.00 06/15/2021 10:58 AM   HDL 49 10/01/2012 01:05 PM   CHOLHDL 2 06/15/2021 10:58 AM   LDLCALC 50 06/15/2021 10:58 AM   LDLCALC 81 10/01/2012 01:05 PM   LDLDIRECT 124.0 04/23/2019 08:28 AM  Wt Readings from Last 3 Encounters:  08/02/21 177 lb 6 oz (80.5 kg)  07/07/21 178 lb (80.7 kg)  07/05/21 179 lb (81.2 kg)     Exam:    BP (!) 140/52 (BP Location: Left Arm, Patient Position: Sitting, Cuff Size: Normal)    Pulse 70    Ht 6' (1.829 m)    Wt 177 lb 6 oz (80.5 kg)    BMI 24.06 kg/m  Constitutional:  oriented to person, place, and time. No distress.  HENT:  Head: Grossly normal Eyes:  no discharge. No scleral icterus.  Neck: No JVD, no carotid bruits  Cardiovascular: Regular rate and rhythm, no murmurs appreciated Pulmonary/Chest: Clear to auscultation bilaterally, no wheezes or rails Abdominal: Soft.  no distension.  no tenderness.  Musculoskeletal: Normal range of motion Neurological:  normal muscle tone. Coordination normal. No atrophy Skin: Skin warm and dry Psychiatric: normal affect, pleasant  ASSESSMENT & PLAN:    Atrial flutter Prior cardioversion, he is maintaining normal sinus rhythm On anticoagulation, very high price Recommend recheck into price of Pradaxa 150 twice daily Will change over to Pradaxa once covered and generic pricing At that time would stop the Eliquis, he is aware  Atherosclerosis of native coronary artery of native heart with stable angina  pectoris (HCC) Currently with no symptoms of angina. No further workup at this time. Continue current medication regimen. Recommend he restart his walking program, start isosorbide mononitrate 30 mg daily  Essential hypertension Adding isosorbide as above  S/P CABG (coronary artery bypass graft) Stable grafts   Total encounter time more than 25 minutes  Greater than 50% was spent in counseling and coordination of care with the patient     Signed, Ida Rogue, MD  08/02/2021 1:51 PM    [place] Office [address], [place], [place] [address]

## 2021-08-02 ENCOUNTER — Other Ambulatory Visit: Payer: Self-pay

## 2021-08-02 ENCOUNTER — Encounter: Payer: Self-pay | Admitting: Cardiovascular Disease

## 2021-08-02 ENCOUNTER — Ambulatory Visit (INDEPENDENT_AMBULATORY_CARE_PROVIDER_SITE_OTHER): Payer: Medicare Other | Admitting: Cardiovascular Disease

## 2021-08-02 ENCOUNTER — Ambulatory Visit: Payer: Medicare Other | Admitting: Pharmacist

## 2021-08-02 VITALS — BP 140/52 | HR 70 | Ht 72.0 in | Wt 177.4 lb

## 2021-08-02 DIAGNOSIS — M791 Myalgia, unspecified site: Secondary | ICD-10-CM

## 2021-08-02 DIAGNOSIS — I2510 Atherosclerotic heart disease of native coronary artery without angina pectoris: Secondary | ICD-10-CM

## 2021-08-02 DIAGNOSIS — T466X5A Adverse effect of antihyperlipidemic and antiarteriosclerotic drugs, initial encounter: Secondary | ICD-10-CM | POA: Diagnosis not present

## 2021-08-02 DIAGNOSIS — I10 Essential (primary) hypertension: Secondary | ICD-10-CM | POA: Diagnosis not present

## 2021-08-02 DIAGNOSIS — I25118 Atherosclerotic heart disease of native coronary artery with other forms of angina pectoris: Secondary | ICD-10-CM | POA: Diagnosis not present

## 2021-08-02 DIAGNOSIS — I483 Typical atrial flutter: Secondary | ICD-10-CM | POA: Diagnosis not present

## 2021-08-02 DIAGNOSIS — Z951 Presence of aortocoronary bypass graft: Secondary | ICD-10-CM | POA: Diagnosis not present

## 2021-08-02 DIAGNOSIS — E785 Hyperlipidemia, unspecified: Secondary | ICD-10-CM | POA: Diagnosis not present

## 2021-08-02 DIAGNOSIS — I200 Unstable angina: Secondary | ICD-10-CM

## 2021-08-02 DIAGNOSIS — D649 Anemia, unspecified: Secondary | ICD-10-CM | POA: Diagnosis not present

## 2021-08-02 DIAGNOSIS — E782 Mixed hyperlipidemia: Secondary | ICD-10-CM

## 2021-08-02 MED ORDER — ISOSORBIDE MONONITRATE ER 30 MG PO TB24: 30.0000 mg | ORAL_TABLET | Freq: Every day | ORAL | 3 refills | Status: DC

## 2021-08-02 MED ORDER — DABIGATRAN ETEXILATE MESYLATE 150 MG PO CAPS: 150.0000 mg | ORAL_CAPSULE | Freq: Two times a day (BID) | ORAL | 6 refills | Status: DC

## 2021-08-02 MED ORDER — PRALUENT 75 MG/ML ~~LOC~~ SOAJ: 75.0000 mg | SUBCUTANEOUS | 2 refills | Status: DC

## 2021-08-02 NOTE — Patient Instructions (Signed)
Visit Information  Following are the goals we discussed today:  Patient Goals/Self-Care Activities Over the next 90 days, patient will:  - take medications as prescribed collaborate with provider on medication access solutions        Plan: Telephone follow up appointment with care management team member scheduled for:  3 weeks   Catie Darnelle Maffucci, PharmD, [place], CPP Clinical Pharmacist [place] at [facility] [phone]     Please call the care guide team at [phone] if you need to cancel or reschedule your appointment.   Patient verbalizes understanding of instructions provided today and agrees to view in [place].

## 2021-08-02 NOTE — Chronic Care Management (AMB) (Signed)
Chronic Care Management CCM Pharmacy Note  08/02/2021 Name:  Rodney Delacruz MRN:  [id number] DOB:  11/09/1933  Summary: - Applied for [employer] last week. Turns out patient was already previously approved  Recommendations/Changes made from today's visit: - Continue current regimen at this time  Subjective: Rodney Delacruz is an 87 y.o. year old male who is a primary patient of Caryl Bis, Angela Adam, MD.  The CCM team was consulted for assistance with disease management and care coordination needs.    Care coordination  for  medication access  for pharmacy case management and/or care coordination services.   Objective:  Medications Reviewed Today     Reviewed by Anselm Pancoast, CMA (Certified Medical Assistant) on 08/02/21 at [phone]  Med List Status: <None>   Medication Order Taking? Sig Documenting Provider Last Dose Status Informant  acetaminophen (TYLENOL) 500 MG tablet [id number] Yes Take 1,000 mg by mouth every 6 (six) hours as needed for moderate pain or headache. [provider] Taking Active Self  amLODipine (NORVASC) 10 MG tablet [id number] Yes Take 1 tablet (10 mg total) by mouth every other day. Minna Merritts, MD Taking Active Self  apixaban (ELIQUIS) 5 MG TABS tablet [id number] Yes Take 1 tablet (5 mg total) by mouth 2 (two) times daily. Minna Merritts, MD Taking Active Self  diphenhydrAMINE (BENADRYL) 25 MG tablet [id number] Yes Take 25 mg by mouth daily as needed for allergies. [provider] Taking Active Self  fluticasone (FLONASE) 50 MCG/ACT nasal spray [id number] Yes SPRAY TWO SPRAYS IN EACH NOSTRIL ONCE DAILY  Patient taking differently: 1 spray daily as needed for allergies.   [name], MD Taking Active Self  metoprolol succinate (TOPROL-XL) 25 MG 24 hr tablet [id number] Yes Take 1 tablet (25 mg total) by mouth daily. Minna Merritts, MD Taking Active Self  Multiple Vitamin (MULTI-VITAMIN DAILY PO) [id number] Yes Take 1 tablet  by mouth daily. [provider] Taking Active Self  nitroGLYCERIN (NITROSTAT) 0.4 MG SL tablet [id number] Yes Place 1 tablet (0.4 mg total) under the tongue every 5 (five) minutes as needed for chest pain. Minna Merritts, MD Taking Active Self  omeprazole (PRILOSEC) 20 MG capsule [id number] Yes Take 20 mg by mouth every other day. [provider] Taking Active Self  Polyethyl Glycol-Propyl Glycol (SYSTANE OP) [id number] Yes Place 1 drop into both eyes daily. [provider] Taking Active Self  PRALUENT 75 MG/ML Darden Palmer [id number] Yes INJECT 75MG  (1ML) INTO THE SKIN EVERY 14 DAYS [name], MD Taking Active   ramipril (ALTACE) 10 MG capsule [id number] Yes TAKE ONE CAPSULE BY MOUTH DAILY Rockey Situ Kathlene November, MD Taking Active Self  sildenafil (VIAGRA) 50 MG tablet [id number] Yes TAKE 1 TO 2 TABLETS BY MOUTH DAILY AS NEEDED FOR ERECTILE DYSFUNCTION [name], MD Taking Active             Pertinent Labs:   Lab Results  Component Value Date   HGBA1C 5.8 12/24/2019   Lab Results  Component Value Date   CHOL 115 06/15/2021   HDL 47.00 06/15/2021   LDLCALC 50 06/15/2021   LDLDIRECT 124.0 04/23/2019   TRIG 90.0 06/15/2021   CHOLHDL 2 06/15/2021   Lab Results  Component Value Date   CREATININE 1.27 06/15/2021   BUN 20 06/15/2021   NA 139 06/15/2021   K 4.7 06/15/2021   CL 106 06/15/2021   CO2 27 06/15/2021    SDOH:  (Social Determinants of Health) assessments  and interventions performed:    CCM Care Plan  Review of patient past medical history, allergies, medications, health status, including review of consultants reports, laboratory and other test data, was performed as part of comprehensive evaluation and provision of chronic care management services.   Care Plan : Medication Management  Updates made by De Hollingshead, RPH-CPP since 08/02/2021 12:00 AM     Problem: HLD, Atrial Flutter, CAD      Long-Range Goal: Disease Progression  Prevention   Start Date: 01/05/2021  Recent Progress: On track  Priority: High  Note:   Current Barriers:  Unable to achieve control of cholesterol  Unable to afford medication regimen  Pharmacist Clinical Goal(s):  Over the next 90 days, patient will achieve control of cholesterol through collaboration with PharmD and provider.   Interventions: 1:1 collaboration with [name], MD regarding development and update of comprehensive plan of care as evidenced by provider attestation and co-signature Inter-disciplinary care team collaboration (see longitudinal plan of care) Comprehensive medication review performed; medication list updated in electronic medical record  Hypertension, Atrial Flutter: Appropriately managed; current treatment: rate control: metoprolol succinate 25 mg daily; anticoagulant: Eliquis 5 mg BID  S/p cardioversion Additional antihypertensives: ramipril 10 mg daily, amlodipine 10 mg every other day Reports he is in the Coverage Gap and having a difficult time affording Eliquis. Discussed patient assistance. Follow out of pocket spend in 2023 to pursue assistance when eligible.  Recommend to continue current regimen at this time   Hyperlipidemia, CAD s/p revascularization: Uncontrolled; current treatment: Praluent 75 mg Q14 days Medications previously tried: atorvastatin, rosuvastatin, pravastatin, lovastatin - all resulted in significant muscle aches and pains Antiplatelet regimen: aspirin 81 mg daily Reports they never contacted [employer] for copay support. However, contacted [employer] today to follow up on application and they note the patient was previously approved through 12/07/21. BIN: [id number]; PCN: [id number]; ID: [id number]; Group: [id number]. Contacted pharmacy with this information, they note that patient isn't due for a refill until 1/2, so they cannot check. Will call back at that time.  GERD: Appropriately managed; current regimen: omeprazole  20 mg every other day Continue current regimen at this time  Patient Goals/Self-Care Activities Over the next 90 days, patient will:  - take medications as prescribed collaborate with provider on medication access solutions      Plan: Telephone follow up appointment with care management team member scheduled for:  3 weeks  Catie Darnelle Maffucci, PharmD, [place], [place] Pharmacist [employer] at [employer] [phone]

## 2021-08-02 NOTE — Patient Instructions (Addendum)
Medication Instructions:  Please START IMDUR 30 mg daily Please STOP Sildenafil   Check the price of pradaxa 150 mg twice a day  (Instead of eliquis) We will stop Eliquis if pardaxa is cheaper   If you need a refill on your cardiac medications before your next appointment, please call your pharmacy.   Lab work: No new labs needed  Testing/Procedures: No new testing needed  Follow-Up: At [facility], you and your health needs are our priority.  As part of our continuing mission to provide you with exceptional heart care, we have created designated Provider Care Teams.  These Care Teams include your primary Cardiologist (physician) and Advanced Practice Providers (APPs -  Physician Assistants and Nurse Practitioners) who all work together to provide you with the care you need, when you need it.  You will need a follow up appointment in 12 months  Providers on your designated Care Team:   Murray Hodgkins, NP Christell Faith, PA-C Cadence Kathlen Mody, [place]  COVID-19 Vaccine Information can be found at: ShippingScam.co.uk For questions related to vaccine distribution or appointments, please email [email or web address] or call [phone].

## 2021-08-12 ENCOUNTER — Other Ambulatory Visit: Payer: Self-pay | Admitting: Cardiovascular Disease

## 2021-08-12 NOTE — Telephone Encounter (Signed)
Prescription refill request for Eliquis received. Indication: Atrial Flutter Last office visit: 08/02/21  Johnny Bridge MD Scr: 1.27 on 06/15/21 Age: 87 Weight: 80.5kg  Based on above findings Eliquis 5mg  twice daily is the appropriate dose.  Refill approved.   Pt plans to change to Pradaxa when it becomes cheaper.

## 2021-08-12 NOTE — Telephone Encounter (Signed)
Refill request

## 2021-08-14 DIAGNOSIS — E782 Mixed hyperlipidemia: Secondary | ICD-10-CM

## 2021-08-14 DIAGNOSIS — I2510 Atherosclerotic heart disease of native coronary artery without angina pectoris: Secondary | ICD-10-CM | POA: Diagnosis not present

## 2021-08-14 DIAGNOSIS — I10 Essential (primary) hypertension: Secondary | ICD-10-CM | POA: Diagnosis not present

## 2021-08-17 ENCOUNTER — Ambulatory Visit (INDEPENDENT_AMBULATORY_CARE_PROVIDER_SITE_OTHER): Payer: Medicare Other | Admitting: Pharmacist

## 2021-08-17 DIAGNOSIS — I10 Essential (primary) hypertension: Secondary | ICD-10-CM

## 2021-08-17 DIAGNOSIS — E782 Mixed hyperlipidemia: Secondary | ICD-10-CM

## 2021-08-17 DIAGNOSIS — I2510 Atherosclerotic heart disease of native coronary artery without angina pectoris: Secondary | ICD-10-CM

## 2021-08-17 NOTE — Patient Instructions (Signed)
Visit Information  Following are the goals we discussed today:    Patient Goals/Self-Care Activities Over the next 90 days, patient will:  - take medications as prescribed collaborate with provider on medication access solutions          Plan: Telephone follow up appointment with care management team member scheduled for:  3 months   Rodney Delacruz, PharmD, [place], CPP Clinical Pharmacist [place] at [facility] [phone]     Please call the care guide team at [phone] if you need to cancel or reschedule your appointment.   Patient verbalizes understanding of instructions provided today and agrees to view in [place].

## 2021-08-17 NOTE — Chronic Care Management (AMB) (Signed)
Chronic Care Management CCM Pharmacy Note  08/17/2021 Name:  Rodney Delacruz MRN:  [id number] DOB:  01-Feb-1934  Summary: - Due to refill Praluent with [place] information  Recommendations/Changes made from today's visit: - Medication ran through for $0 copay.   Subjective: Rodney Delacruz is an 88 y.o. year old male who is a primary patient of Rodney Delacruz, Rodney Adam, MD.  The CCM team was consulted for assistance with disease management and care coordination needs.    Engaged with patient by telephone for follow up visit for pharmacy case management and/or care coordination services.   Objective:  Medications Reviewed Today     Reviewed by Anselm Pancoast, CMA (Certified Medical Assistant) on 08/02/21 at [phone]  Med List Status: <None>   Medication Order Taking? Sig Documenting Provider Last Dose Status Informant  acetaminophen (TYLENOL) 500 MG tablet [id number] Yes Take 1,000 mg by mouth every 6 (six) hours as needed for moderate pain or headache. [provider] Taking Active Self  amLODipine (NORVASC) 10 MG tablet [id number] Yes Take 1 tablet (10 mg total) by mouth every other day. Rodney Merritts, MD Taking Active Self  apixaban (ELIQUIS) 5 MG TABS tablet [id number] Yes Take 1 tablet (5 mg total) by mouth 2 (two) times daily. Rodney Merritts, MD Taking Active Self  diphenhydrAMINE (BENADRYL) 25 MG tablet [id number] Yes Take 25 mg by mouth daily as needed for allergies. [provider] Taking Active Self  fluticasone (FLONASE) 50 MCG/ACT nasal spray [id number] Yes SPRAY TWO SPRAYS IN EACH NOSTRIL ONCE DAILY  Patient taking differently: 1 spray daily as needed for allergies.   Rodney Haven, MD Taking Active Self  metoprolol succinate (TOPROL-XL) 25 MG 24 hr tablet [id number] Yes Take 1 tablet (25 mg total) by mouth daily. Rodney Merritts, MD Taking Active Self  Multiple Vitamin (MULTI-VITAMIN DAILY PO) [id number] Yes Take 1 tablet by mouth daily. [provider] Taking Active Self  nitroGLYCERIN (NITROSTAT) 0.4 MG SL tablet [id number] Yes Place 1 tablet (0.4 mg total) under the tongue every 5 (five) minutes as needed for chest pain. Rodney Merritts, MD Taking Active Self  omeprazole (PRILOSEC) 20 MG capsule [id number] Yes Take 20 mg by mouth every other day. [provider] Taking Active Self  Polyethyl Glycol-Propyl Glycol (SYSTANE OP) [id number] Yes Place 1 drop into both eyes daily. [provider] Taking Active Self  PRALUENT 75 MG/ML Rodney Delacruz [id number] Yes INJECT 75MG  (1ML) INTO THE SKIN EVERY 14 DAYS Rodney Haven, MD Taking Active   ramipril (ALTACE) 10 MG capsule [id number] Yes TAKE ONE CAPSULE BY MOUTH DAILY Rodney Situ Kathlene November, MD Taking Active Self  sildenafil (VIAGRA) 50 MG tablet [id number] Yes TAKE 1 TO 2 TABLETS BY MOUTH DAILY AS NEEDED FOR ERECTILE DYSFUNCTION Rodney Haven, MD Taking Active             Pertinent Labs:   Lab Results  Component Value Date   HGBA1C 5.8 12/24/2019   Lab Results  Component Value Date   CHOL 115 06/15/2021   HDL 47.00 06/15/2021   LDLCALC 50 06/15/2021   LDLDIRECT 124.0 04/23/2019   TRIG 90.0 06/15/2021   CHOLHDL 2 06/15/2021   Lab Results  Component Value Date   CREATININE 1.27 06/15/2021   BUN 20 06/15/2021   NA 139 06/15/2021   K 4.7 06/15/2021   CL 106 06/15/2021   CO2 27 06/15/2021    SDOH:  (Social Determinants of Health) assessments and interventions performed:  SDOH Interventions    Flowsheet Row Most Recent Value  SDOH Interventions   Financial Strain Interventions Other (Comment)  [[facility]]       CCM Care Plan  Review of patient past medical history, allergies, medications, health status, including review of consultants reports, laboratory and other test data, was performed as part of comprehensive evaluation and provision of chronic care management services.   Care Plan : Medication Management  Updates made by De Hollingshead, RPH-CPP since 08/17/2021 12:00 AM     Problem: HLD, Atrial Flutter, CAD      Long-Range Goal: Disease Progression Prevention   Start Date: 01/05/2021  Recent Progress: On track  Priority: High  Note:   Current Barriers:  Unable to achieve control of cholesterol  Unable to afford medication regimen  Pharmacist Clinical Goal(s):  Over the next 90 days, patient will achieve control of cholesterol through collaboration with PharmD and provider.   Interventions: 1:1 collaboration with Rodney Haven, MD regarding development and update of comprehensive plan of care as evidenced by provider attestation and co-signature Inter-disciplinary care team collaboration (see longitudinal plan of care) Comprehensive medication review performed; medication list updated in electronic medical record  Hypertension, Atrial Flutter: Appropriately managed; current treatment: rate control: metoprolol succinate 25 mg daily; anticoagulant: Eliquis 5 mg BID  S/p cardioversion Additional antihypertensives: ramipril 10 mg daily, amlodipine 10 mg every other day Reports he is in the Coverage Gap and having a difficult time affording Eliquis. Discussed patient assistance. Follow out of pocket spend in 2023 to pursue assistance when eligible.  Previously recommend to continue current regimen at this time   Hyperlipidemia, CAD s/p revascularization: Uncontrolled; current treatment: Praluent 75 mg Q14 days Medications previously tried: atorvastatin, rosuvastatin, pravastatin, lovastatin - all resulted in significant muscle aches and pains Antiplatelet regimen: aspirin 81 mg daily Called pharmacy. They billed against insurance and [facility]: [id number]; PCN: [id number]; ID: [id number]; Group: [id number]). Copay is $0.   GERD: Appropriately managed; current regimen: omeprazole 20 mg every other day Previously recommended to continue current regimen at this time  Patient Goals/Self-Care  Activities Over the next 90 days, patient will:  - take medications as prescribed collaborate with provider on medication access solutions       Plan: Telephone follow up appointment with care management team member scheduled for:  3 months  Rodney Darnelle Maffucci, PharmD, [place], [place] Pharmacist [employer] at [facility] [employer] [facility] [phone]

## 2021-08-25 DIAGNOSIS — Z20822 Contact with and (suspected) exposure to covid-19: Secondary | ICD-10-CM | POA: Diagnosis not present

## 2021-09-14 DIAGNOSIS — I10 Essential (primary) hypertension: Secondary | ICD-10-CM

## 2021-09-14 DIAGNOSIS — I2510 Atherosclerotic heart disease of native coronary artery without angina pectoris: Secondary | ICD-10-CM

## 2021-09-14 DIAGNOSIS — E782 Mixed hyperlipidemia: Secondary | ICD-10-CM

## 2021-09-20 ENCOUNTER — Ambulatory Visit: Payer: Medicare Other | Admitting: Cardiovascular Disease

## 2021-09-30 ENCOUNTER — Other Ambulatory Visit: Payer: Self-pay | Admitting: Family Medicine

## 2021-09-30 ENCOUNTER — Other Ambulatory Visit: Payer: Self-pay | Admitting: Cardiovascular Disease

## 2021-10-19 ENCOUNTER — Ambulatory Visit: Payer: Self-pay | Admitting: Pharmacist

## 2021-10-19 NOTE — Chronic Care Management (AMB) (Signed)
?  Chronic Care Management  ? ?Note ? ?10/19/2021 ?Name: Rodney Delacruz MRN: [id number] DOB: [birth year] ? ? ? ?Closing pharmacy CCM case at this time. Will collaborate with Care Guide to outreach to schedule follow up with RN CM. Patient has clinic contact information for future questions or concerns.  ? ?Catie Darnelle Maffucci, PharmD, [place], CPP ?Clinical Pharmacist ?Therapist, music at [facility] ?[phone] ? ?

## 2021-10-25 DIAGNOSIS — L988 Other specified disorders of the skin and subcutaneous tissue: Secondary | ICD-10-CM | POA: Diagnosis not present

## 2021-10-25 DIAGNOSIS — C44319 Basal cell carcinoma of skin of other parts of face: Secondary | ICD-10-CM | POA: Diagnosis not present

## 2021-10-25 DIAGNOSIS — C44311 Basal cell carcinoma of skin of nose: Secondary | ICD-10-CM | POA: Diagnosis not present

## 2021-11-03 DIAGNOSIS — C44311 Basal cell carcinoma of skin of nose: Secondary | ICD-10-CM | POA: Diagnosis not present

## 2021-11-03 DIAGNOSIS — C44319 Basal cell carcinoma of skin of other parts of face: Secondary | ICD-10-CM | POA: Diagnosis not present

## 2021-11-10 ENCOUNTER — Telehealth: Payer: Self-pay

## 2021-11-10 NOTE — Chronic Care Management (AMB) (Signed)
?  Chronic Care Management  ? ?Note ? ?11/10/2021 ?Name: Rodney Delacruz MRN: [id number] DOB: 01-07-1934 ? ?[name] is a 87 y.o. year old male who is a primary care patient of Caryl Bis, Angela Adam, MD. Rodney Delacruz is currently enrolled in care management services. An additional referral for RNCM  was placed.  ? ?Follow up plan: ?Patient declines further follow up and engagement by the care management team. Appropriate care team members and provider have been notified via electronic communication.  ? ?Noreene Larsson, RMA ?Care Guide, Embedded Care Coordination ?[facility]  ?[place], [place] [address] ?Direct Dial: [phone] ?Museum/gallery [email or web address] ?Website: [email or web address]  ? ?

## 2021-11-15 ENCOUNTER — Telehealth: Payer: Medicare Other

## 2021-12-13 ENCOUNTER — Ambulatory Visit (INDEPENDENT_AMBULATORY_CARE_PROVIDER_SITE_OTHER): Payer: Medicare Other

## 2021-12-13 ENCOUNTER — Ambulatory Visit (INDEPENDENT_AMBULATORY_CARE_PROVIDER_SITE_OTHER): Payer: Medicare Other | Admitting: Family Medicine

## 2021-12-13 ENCOUNTER — Encounter: Payer: Self-pay | Admitting: Family Medicine

## 2021-12-13 DIAGNOSIS — I10 Essential (primary) hypertension: Secondary | ICD-10-CM | POA: Diagnosis not present

## 2021-12-13 DIAGNOSIS — I483 Typical atrial flutter: Secondary | ICD-10-CM

## 2021-12-13 DIAGNOSIS — I2510 Atherosclerotic heart disease of native coronary artery without angina pectoris: Secondary | ICD-10-CM | POA: Diagnosis not present

## 2021-12-13 DIAGNOSIS — E782 Mixed hyperlipidemia: Secondary | ICD-10-CM

## 2021-12-13 DIAGNOSIS — R202 Paresthesia of skin: Secondary | ICD-10-CM | POA: Diagnosis not present

## 2021-12-13 DIAGNOSIS — M549 Dorsalgia, unspecified: Secondary | ICD-10-CM | POA: Diagnosis not present

## 2021-12-13 DIAGNOSIS — M40204 Unspecified kyphosis, thoracic region: Secondary | ICD-10-CM | POA: Diagnosis not present

## 2021-12-13 DIAGNOSIS — G8929 Other chronic pain: Secondary | ICD-10-CM

## 2021-12-13 DIAGNOSIS — K219 Gastro-esophageal reflux disease without esophagitis: Secondary | ICD-10-CM

## 2021-12-13 DIAGNOSIS — M545 Low back pain, unspecified: Secondary | ICD-10-CM | POA: Diagnosis not present

## 2021-12-13 DIAGNOSIS — M546 Pain in thoracic spine: Secondary | ICD-10-CM | POA: Diagnosis not present

## 2021-12-13 LAB — BASIC METABOLIC PANEL
BUN: 21 mg/dL (ref 6–23)
CO2: 24 mEq/L (ref 19–32)
Calcium: 8.5 mg/dL (ref 8.4–10.5)
Chloride: 107 mEq/L (ref 96–112)
Creatinine, Ser: 1.18 mg/dL (ref 0.40–1.50)
GFR: 56.13 mL/min — ABNORMAL LOW (ref >60.00)
Glucose, Bld: 92 mg/dL (ref 70–99)
Potassium: 4.7 mEq/L (ref 3.5–5.1)
Sodium: 140 mEq/L (ref 135–145)

## 2021-12-13 NOTE — Patient Instructions (Signed)
Nice to see you. ?Somebody from physical therapy should contact you to set this up. ?We will get x-rays today and lab work and contact you with the results. ?

## 2021-12-13 NOTE — Assessment & Plan Note (Signed)
Chronic issue.  Discussed taking Pepcid 30-60 minutes prior to breakfast and dinner.  If he has any worsening or persistent symptoms he will let us know. ?

## 2021-12-13 NOTE — Assessment & Plan Note (Signed)
Well-controlled.  He will continue Praluent 75 mg into the skin every 14 days. ?

## 2021-12-13 NOTE — Progress Notes (Addendum)
?Tommi Rumps, MD ?Phone: [phone] ? ?Rodney Delacruz is a 88 y.o. male who presents today for f/u. ? ?HYPERLIPIDEMIA ?Symptoms ?Chest pain on exertion:  no   Leg claudication:   no ?Medications: ?Compliance- taking praluent Right upper quadrant pain- no  Muscle aches- no ?Lipid Panel  ?   ?Component Value Date/Time  ? CHOL 115 06/15/2021 1058  ? CHOL 148 10/01/2012 1305  ? TRIG 90.0 06/15/2021 1058  ? TRIG 89 10/01/2012 1305  ? HDL 47.00 06/15/2021 1058  ? HDL 49 10/01/2012 1305  ? CHOLHDL 2 06/15/2021 1058  ? VLDL 18.0 06/15/2021 1058  ? VLDL 18 10/01/2012 1305  ? LDLCALC 50 06/15/2021 1058  ? [place] 81 10/01/2012 1305  ? LDLDIRECT 124.0 04/23/2019 0828  ? ?HYPERTENSION ?Disease Monitoring Chest pain- no    Dyspnea- no ?Medications ?Compliance-  taking amlodipine, ramipril, imdur, metoprolol.   Edema- no ?BMET ?   ?Component Value Date/Time  ? NA 139 06/15/2021 1058  ? NA 142 10/02/2012 0533  ? K 4.7 06/15/2021 1058  ? K 4.1 10/02/2012 0533  ? CL 106 06/15/2021 1058  ? CL 108 (H) 10/02/2012 0533  ? CO2 27 06/15/2021 1058  ? CO2 27 10/02/2012 0533  ? GLUCOSE 87 06/15/2021 1058  ? GLUCOSE 101 (H) 10/02/2012 0533  ? BUN 20 06/15/2021 1058  ? BUN 18 10/02/2012 0533  ? CREATININE 1.27 06/15/2021 1058  ? CREATININE 1.17 10/02/2012 0533  ? CALCIUM 8.8 06/15/2021 1058  ? CALCIUM 8.7 10/02/2012 0533  ? GFRNONAA >60 02/19/2021 0941  ? GFRNONAA >60 10/02/2012 0533  ? GFRAA >60 10/02/2012 0533  ? ?A. Flutter: taking eliquis. Pradaxa is on his list though he does not think he is taking this. No palpitations or bleeding.  ? ?GERD:   ?Reflux symptoms: sour stomach and some spitting up of reflux over the past month, well controlled by pepcid mid day and tums at night   ?Abd pain: no   ?Blood in stool: no  ?Dysphagia: no   ?EGD: about 20 years ago, reports they stretched his esophagus  ?Medication: pepcid ? ?Chronic back pain/left foot numbness: Patient notes occasional back pain in his thoracic spine.  He notes the dorsum  of his left foot and anterior left ankle feel like they are numb and asleep intermittently.  He feels as though his legs may be a little weak compared to previously.  He notes no prior imaging of his back. ? ? ?Social History  ? ?Tobacco Use  ?Smoking Status Former  ? Packs/day: 1.00  ? Years: 20.00  ? Pack years: 20.00  ? Types: Cigarettes  ? Quit date: 08/15/1976  ? Years since quitting: 45.3  ?Smokeless Tobacco Never  ? ? ?Current Outpatient Medications on File Prior to Visit  ?Medication Sig Dispense Refill  ? acetaminophen (TYLENOL) 500 MG tablet Take 1,000 mg by mouth every 6 (six) hours as needed for moderate pain or headache.    ? Alirocumab (PRALUENT) 75 MG/ML SOAJ Inject 75 mg into the skin every 14 (fourteen) days. 6 mL 2  ? amLODipine (NORVASC) 10 MG tablet Take 1 tablet (10 mg total) by mouth every other day. 45 tablet 3  ? diphenhydrAMINE (BENADRYL) 25 MG tablet Take 25 mg by mouth daily as needed for allergies.    ? ELIQUIS 5 MG TABS tablet TAKE ONE TABLET BY MOUTH TWICE A DAY 60 tablet 6  ? fluticasone (FLONASE) 50 MCG/ACT nasal spray Place 1 spray into both nostrils daily as needed  for allergies. 48 mL 3  ? isosorbide mononitrate (IMDUR) 30 MG 24 hr tablet Take 1 tablet (30 mg total) by mouth daily. 90 tablet 3  ? metoprolol succinate (TOPROL-XL) 25 MG 24 hr tablet Take 1 tablet (25 mg total) by mouth daily. 90 tablet 3  ? Multiple Vitamin (MULTI-VITAMIN DAILY PO) Take 1 tablet by mouth daily.    ? nitroGLYCERIN (NITROSTAT) 0.4 MG SL tablet Place 1 tablet (0.4 mg total) under the tongue every 5 (five) minutes as needed for chest pain. 25 tablet 3  ? omeprazole (PRILOSEC) 20 MG capsule Take 20 mg by mouth every other day.    ? Polyethyl Glycol-Propyl Glycol (SYSTANE OP) Place 1 drop into both eyes daily.    ? ramipril (ALTACE) 10 MG capsule TAKE ONE CAPSULE BY MOUTH DAILY 90 capsule 2  ? ?No current facility-administered medications on file prior to visit.  ? ? ? ?ROS see history of present  illness ? ?Objective ? ?Physical Exam ?Vitals:  ? 12/13/21 1003  ?BP: 130/70  ?Pulse: 65  ?Temp: 98.2 ?F (36.8 ?C)  ?SpO2: 98%  ? ? ?BP Readings from Last 3 Encounters:  ?12/13/21 130/70  ?08/02/21 (!) 140/52  ?07/07/21 (!) 151/58  ? ?Wt Readings from Last 3 Encounters:  ?12/13/21 176 lb 3.2 oz (79.9 kg)  ?08/02/21 177 lb 6 oz (80.5 kg)  ?07/07/21 178 lb (80.7 kg)  ? ? ?Physical Exam ?Constitutional:   ?   General: He is not in acute distress. ?   Appearance: He is not diaphoretic.  ?Cardiovascular:  ?   Rate and Rhythm: Normal rate and regular rhythm.  ?   Heart sounds: Normal heart sounds.  ?Pulmonary:  ?   Effort: Pulmonary effort is normal.  ?   Breath sounds: Normal breath sounds.  ?Abdominal:  ?   General: Bowel sounds are normal. There is no distension.  ?   Palpations: Abdomen is soft.  ?   Tenderness: There is no abdominal tenderness.  ?Skin: ?   General: Skin is warm and dry.  ?Neurological:  ?   Mental Status: He is alert.  ?   Comments: 5/5 strength bilateral quads, hamstrings, plantarflexion, and dorsiflexion, sensation to light touch intact bilateral lower extremities  ? ? ? ?Assessment/Plan: Please see individual problem list. ? ?Problem List Items Addressed This Visit   ? ? Atrial flutter (HCC) (Chronic)  ?  Sinus rhythm.  He will remain on Eliquis 5 mg twice daily.  He will confirm that he is not taking Pradaxa. ? ?  ?  ? Chronic back pain (Chronic)  ?  Patient with chronic back pain now with some possible paresthesia symptoms in his left leg.  Discussed the potential for seeing neurology versus imaging and treating his back with physical therapy.  He opted for x-rays and physical therapy.  If not improving would consider neurology evaluation for EMG testing. ? ?  ?  ? Relevant Orders  ? DG Thoracic Spine 2 View  ? Ambulatory referral to Physical Therapy  ? DG Lumbar Spine Complete  ? Essential hypertension (Chronic)  ?  Well-controlled.  He will continue amlodipine 10 mg once daily, ramipril 10 mg  once daily, Imdur 30 mg daily, and metoprolol 25 mg daily. ? ?  ?  ? Relevant Orders  ? Basic Metabolic Panel (BMET)  ? GERD (gastroesophageal reflux disease) (Chronic)  ?  Chronic issue.  Discussed taking Pepcid 30-60 minutes prior to breakfast and dinner.  If he has any  worsening or persistent symptoms he will let us know. ? ?  ?  ? Mixed hyperlipidemia (Chronic)  ?  Well-controlled.  He will continue Praluent 75 mg into the skin every 14 days. ? ?  ?  ? ? ? ?Return in about 6 months (around 06/15/2022). ? ?This visit occurred during the SARS-CoV-2 public health emergency.  Safety protocols were in place, including screening questions prior to the visit, additional usage of staff PPE, and extensive cleaning of exam room while observing appropriate contact time as indicated for disinfecting solutions.  ? ? ?Tommi Rumps, MD ?[place] ? ?

## 2021-12-13 NOTE — Assessment & Plan Note (Signed)
Sinus rhythm.  He will remain on Eliquis 5 mg twice daily.  He will confirm that he is not taking Pradaxa. ?

## 2021-12-13 NOTE — Assessment & Plan Note (Signed)
Patient with chronic back pain now with some possible paresthesia symptoms in his left leg.  Discussed the potential for seeing neurology versus imaging and treating his back with physical therapy.  He opted for x-rays and physical therapy.  If not improving would consider neurology evaluation for EMG testing. ?

## 2021-12-13 NOTE — Assessment & Plan Note (Signed)
Well-controlled.  He will continue amlodipine 10 mg once daily, ramipril 10 mg once daily, Imdur 30 mg daily, and metoprolol 25 mg daily. ?

## 2021-12-13 NOTE — Addendum Note (Signed)
Addended by: [name] on: 12/13/2021 10:32 AM ? ? Modules accepted: Orders ? ?

## 2021-12-16 NOTE — Therapy (Signed)
?OUTPATIENT PHYSICAL THERAPY EVALUATION ? ? ?Patient Name: Rodney Delacruz ?MRN: [id number] ?DOB:Jul 25, 1934, 88 y.o., male ?Today's Date: 12/23/2021 ? ? PT End of Session - 12/23/21 1437   ? ? Visit Number 1   ? Number of Visits 25   ? Date for PT Re-Evaluation 03/17/22   ? Authorization Type MEDICARE PART B   ? Progress Note Due on Visit 10   ? PT Start Time 1030   ? PT Stop Time 1115   ? PT Time Calculation (min) 45 min   ? Activity Tolerance Patient tolerated treatment well   ? Behavior During Therapy [facility] for tasks assessed/performed   ? ?  ?  ? ?  ? ? ?Past Medical History:  ?Diagnosis Date  ? Allergy   ? Anxiety   ? Coronary artery disease   ? Depression   ? Elevated PSA   ? Followed by Dr. Rosana Berger every 6 month  ? Heart murmur   ? Hyperlipidemia   ? Hypertension   ? MI (myocardial infarction) ([place]) 2009  ? Ulcer   ? ?Past Surgical History:  ?Procedure Laterality Date  ? CARDIAC CATHETERIZATION  2005, 2009  ? CARDIOVERSION N/A 02/23/2021  ? Procedure: CARDIOVERSION;  Surgeon: Minna Merritts, MD;  Location: [facility] ORS;  Service: Cardiovascular;  Laterality: N/A;  ? CHOLECYSTECTOMY  2001  ? CORONARY ANGIOPLASTY  2005  ? s/p stent placement @ [facility]  ? CORONARY ARTERY BYPASS GRAFT  2009  ? [facility]  ? LEFT HEART CATH AND CORS/GRAFTS ANGIOGRAPHY Left 07/07/2021  ? Procedure: LEFT HEART CATH AND CORS/GRAFTS ANGIOGRAPHY;  Surgeon: Wellington Hampshire, MD;  Location: [place] [facility];  Service: Cardiovascular;  Laterality: Left;  ? TONSILLECTOMY AND ADENOIDECTOMY  1942  ? ?Patient Active Problem List  ? Diagnosis Date Noted  ? Chronic back pain 12/13/2021  ? Atrial flutter ([place]) 02/12/2021  ? Weight loss 12/21/2020  ? Hammer toes of both feet 06/23/2020  ? Intermittent claudication ([place]) 06/23/2020  ? Kidney dysfunction 12/18/2019  ? GERD (gastroesophageal reflux disease) 03/19/2018  ? Left foot pain 09/19/2017  ? Decreased pedal pulses 09/19/2017  ? Prediabetes 09/19/2017  ? Erectile dysfunction 04/24/2017  ? Elevated PSA  07/20/2016  ? Gout 07/27/2015  ? Personal history of other malignant neoplasm of skin 07/16/2013  ? CAD (coronary artery disease) 10/11/2012  ? Mixed hyperlipidemia 10/11/2012  ? Essential hypertension 10/11/2012  ? S/P CABG (coronary artery bypass graft) 10/11/2012  ? ? ?PCP: Angela Adam. Caryl Bis, MD ? ?REFERRING PROVIDER: Angela Adam. Caryl Bis, MD ? ?REFERRING DIAG: chronic midline back pain, unspecified back location ? ?THERAPY DIAG:  ?Pain in thoracic spine ? ?Other symptoms and signs involving the nervous system ? ?Unsteadiness on feet ? ?ONSET DATE: "several months ago" ? ?SUBJECTIVE:                                                                                                                                                                                          ? ?  SUBJECTIVE STATEMENT: ?Patient reports he went to see his PCP who referred him to PT. Patient had reported new pain he describes a nerve that goes from his big toe to anterior distal third of shin on both sides, L > R. After taking lumbar xrays, PCP told patient thought some new pain he was having was likely from his back.  Patient states this has been going on several months and he is unsure if it started gradually or all of a sudden. He has a history of hammer toes on both sides that patient's PCP felt would not cause this pain. Patient denies low back pain, but states he gets occasional upper thoracic pain near the CT junction that he was worried was his heart and his cardiologist said it was not heart related. He denies history of diagnosis of neuropathy but does state the bottom of his feel different than they used to - "kind of softer." Patient denies having problems with his back or concordant pain in his foot/lower legs before. He states he feels the pain is staying the same since onset. He states he feels like he loses his balance more and walks different than he used to. He walks about 1 mile every day. Right handed.  ? ?PERTINENT HISTORY:   ?Patient is a 88 y.o. male who presents to outpatient physical therapy with a referral for medical diagnosis chronic midline back pain, unspecified back location. This patient's chief complaints consist of numbness that feels like a nerve from the B big toe over the dorsal aspect of foot to lower third of anterior lower leg, L > R, and intermittent upper back pain between scapulae (<5 min), and feeling unsteady on feet leading to the following functional deficits: difficulty with shopping and community ambulation, confidence in weight bearing activities, confidence about future ability to ambulate, prolonged standing with UE use. Relevant past medical history and comorbidities include see above.  Patient denies hx of cancer, stroke, seizures, diabetes, unexplained weight loss, unexplained changes in bowel or bladder problems, unexplained stumbling or dropping things, and spinal surgery ? ?PAIN:  ?Are you having pain? Yes ? ? B Lower leg pain:  ?NPRS scale: Current: 0/10,  Best: 0/10, Worst: 2/10. ?Pain location: big toe to anterior distal third of shin on both sides, L > R ?Pain description: numbness, tightness ?Aggravating factors: nothing particular, feels it more when sitting, moving foot around.  ?Relieving factors: being still  ? ?Upper back pain:  ?NPRS scale: Current: 0/10,  Best: 0/10, Worst: 8/10. ?Pain location: between scapula ?Pain description: sudden sharp pain ?Aggravating factors: standing still and doing repetitive movement with UE (cutting strawberries).  ?Relieving factors: stop doing what he is doing (better in 5 min).  ? ?FUNCTIONAL LIMITATIONS: Patient is concerned about condition getting worse and limiting his ability to ambulate well and complete functional activities. Patient states he feels unsteady on his feet and he has to be extra careful when he walks and goes to the store. He needs to hold onto a cart while shopping to feel better. He has difficulty with shopping and community  ambulation, confidence in weight bearing activities, confidence about future ability to ambulate, prolonged standing with UE use. ? ?PRECAUTIONS: None ? ?WEIGHT BEARING RESTRICTIONS No ? ?FALLS:  ?Has patient fallen in last 6 months? No ? ?LIVING ENVIRONMENT: ?Lives with wife ?No concerns about getting around home safely but does feel unsteady at times.   ? ?OCCUPATION: retired, Psychologist, occupational and heating facility maintenance ? ?LEISURE: used to  do a lot of fishing (got too expensive/hard), watches TV and does what he wants to do.  ? ?PLOF: Independent ? ?PATIENT GOALS would like this problem to go away. He is concerned about his lower leg pain progressing to weakness or difficulty walking and getting around. He is worried about it getting worse.  ? ? ?OBJECTIVE ? ?DIAGNOSTIC FINDINGS:  ?Lumbar and Thoracic xray reports from 12/13/2021:  ?THORACIC SPINE 2 VIEWS ?  ?COMPARISON:  None. ?  ?FINDINGS: ?The bones are subjectively under mineralized. Slight exaggerated ?upper thoracic kyphosis. Vertebral body heights are normal. Minor ?spurring with relative preservation of disc spaces. No evidence of ?bony destruction or focal bone lesion. No paravertebral soft tissue ?abnormalities. Prior median sternotomy. ?  ?IMPRESSION: ?Minor degenerative change in the thoracic spine. Slight exaggerated ?upper thoracic kyphosis. No acute or suspicious findings. ? ? ? ?LUMBAR SPINE - COMPLETE 4+ VIEW ?  ?COMPARISON:  None. ?  ?FINDINGS: ?The bones are subjectively under mineralized. Slight levo scoliotic ?curvature. No listhesis. Normal vertebral body heights. No evidence ?of fracture. There is disc space narrowing and spurring at L1-L2 and ?L2-L3. Minor lower lumbar facet hypertrophy. No visible focal bone ?lesion or bone destruction. The sacroiliac joints are congruent. ?  ?IMPRESSION: ?1. Degenerative disc disease at L1-L2 and L2-L3. ?2. Minor lower lumbar facet hypertrophy. ?3. No acute or suspicious bony abnormality" ? ? ? ?SELF-  REPORTED FUNCTION ?FOTO score: not-captured this session (incorrect body part set up) ? ?OBSERVATION/INSPECTION ?Posture ?Posture (standing): increased thoracic kyphosis, flattened lumbar curve, mildly elevate

## 2021-12-23 ENCOUNTER — Encounter: Payer: Self-pay | Admitting: Physical Therapy

## 2021-12-23 ENCOUNTER — Ambulatory Visit: Payer: Medicare Other | Attending: Family Medicine | Admitting: Physical Therapy

## 2021-12-23 DIAGNOSIS — M549 Dorsalgia, unspecified: Secondary | ICD-10-CM | POA: Insufficient documentation

## 2021-12-23 DIAGNOSIS — R2681 Unsteadiness on feet: Secondary | ICD-10-CM | POA: Diagnosis not present

## 2021-12-23 DIAGNOSIS — G8929 Other chronic pain: Secondary | ICD-10-CM | POA: Insufficient documentation

## 2021-12-23 DIAGNOSIS — M546 Pain in thoracic spine: Secondary | ICD-10-CM | POA: Insufficient documentation

## 2021-12-23 DIAGNOSIS — R29818 Other symptoms and signs involving the nervous system: Secondary | ICD-10-CM | POA: Insufficient documentation

## 2021-12-23 NOTE — Therapy (Signed)
?OUTPATIENT PHYSICAL THERAPY TREATMENT NOTE ? ? ?Patient Name: Rodney Delacruz ?MRN: [id number] ?DOB:09-05-1933, 88 y.o., male ?Today's Date: 12/28/2021 ? ?PCP: Angela Adam. Caryl Bis, MD ?REFERRING PROVIDER: Angela Adam. Caryl Bis, MD ? ?END OF SESSION:  ? PT End of Session - 12/28/21 1046   ? ? Visit Number 2   ? Number of Visits 25   ? Date for PT Re-Evaluation 03/17/22   ? Authorization Type MEDICARE PART B   ? Progress Note Due on Visit 10   ? PT Start Time [phone]   ? PT Stop Time 1030   ? PT Time Calculation (min) 40 min   ? Equipment Utilized During Treatment Gait belt   ? Activity Tolerance Patient tolerated treatment well   ? Behavior During Therapy [facility] for tasks assessed/performed   ? ?  ?  ? ?  ? ? ?Past Medical History:  ?Diagnosis Date  ? Allergy   ? Anxiety   ? Coronary artery disease   ? Depression   ? Elevated PSA   ? Followed by Dr. Rosana Berger every 6 month  ? Heart murmur   ? Hyperlipidemia   ? Hypertension   ? MI (myocardial infarction) ([place]) 2009  ? Ulcer   ? ?Past Surgical History:  ?Procedure Laterality Date  ? CARDIAC CATHETERIZATION  2005, 2009  ? CARDIOVERSION N/A 02/23/2021  ? Procedure: CARDIOVERSION;  Surgeon: Minna Merritts, MD;  Location: [facility] ORS;  Service: Cardiovascular;  Laterality: N/A;  ? CHOLECYSTECTOMY  2001  ? CORONARY ANGIOPLASTY  2005  ? s/p stent placement @ [facility]  ? CORONARY ARTERY BYPASS GRAFT  2009  ? [facility]  ? LEFT HEART CATH AND CORS/GRAFTS ANGIOGRAPHY Left 07/07/2021  ? Procedure: LEFT HEART CATH AND CORS/GRAFTS ANGIOGRAPHY;  Surgeon: Wellington Hampshire, MD;  Location: [place] [facility];  Service: Cardiovascular;  Laterality: Left;  ? TONSILLECTOMY AND ADENOIDECTOMY  1942  ? ?Patient Active Problem List  ? Diagnosis Date Noted  ? Chronic back pain 12/13/2021  ? Atrial flutter ([place]) 02/12/2021  ? Weight loss 12/21/2020  ? Hammer toes of both feet 06/23/2020  ? Intermittent claudication ([place]) 06/23/2020  ? Kidney dysfunction 12/18/2019  ? GERD (gastroesophageal reflux disease)  03/19/2018  ? Left foot pain 09/19/2017  ? Decreased pedal pulses 09/19/2017  ? Prediabetes 09/19/2017  ? Erectile dysfunction 04/24/2017  ? Elevated PSA 07/20/2016  ? Gout 07/27/2015  ? Personal history of other malignant neoplasm of skin 07/16/2013  ? CAD (coronary artery disease) 10/11/2012  ? Mixed hyperlipidemia 10/11/2012  ? Essential hypertension 10/11/2012  ? S/P CABG (coronary artery bypass graft) 10/11/2012  ? ? ?REFERRING DIAG: chronic midline back pain, unspecified back location ? ?THERAPY DIAG:  ?Pain in thoracic spine ? ?Other symptoms and signs involving the nervous system ? ?Unsteadiness on feet ? ?ONSET DATE: "several months ago" ? ?PERTINENT HISTORY: Patient is a 88 y.o. male who presents to outpatient physical therapy with a referral for medical diagnosis chronic midline back pain, unspecified back location. This patient's chief complaints consist of numbness that feels like a nerve from the B big toe over the dorsal aspect of foot to lower third of anterior lower leg, L > R, and intermittent upper back pain between scapulae (<5 min), and feeling unsteady on feet leading to the following functional deficits: difficulty with shopping and community ambulation, confidence in weight bearing activities, confidence about future ability to ambulate, prolonged standing with UE use. Relevant past medical history and comorbidities include see above.  Patient denies  hx of cancer, stroke, seizures, diabetes, unexplained weight loss, unexplained changes in bowel or bladder problems, unexplained stumbling or dropping things, and spinal surgery ? ?PRECAUTIONS: fall? ? ?SUBJECTIVE: Patient reports no pain upon arrival. He states his legs are still bothering him some. His symptoms were no different after last PT session.  ? ?PAIN:  ?Are you having pain? no ? ? ?OBJECTIVE ?   ? [facility] PT Assessment - 12/28/21 0001   ? ?  ? Functional Gait  Assessment  ? Gait Level Surface Walks 20 ft in less than 5.5 sec, no  assistive devices, good speed, no evidence for imbalance, normal gait pattern, deviates no more than 6 in outside of the 12 in walkway width.   self selected: 5.75 seconds; fast: 5.22 seconds  ? Change in Gait Speed Able to smoothly change walking speed without loss of balance or gait deviation. Deviate no more than 6 in outside of the 12 in walkway width.   ? Gait with Horizontal Head Turns Performs head turns smoothly with slight change in gait velocity (eg, minor disruption to smooth gait path), deviates 6-10 in outside 12 in walkway width, or uses an assistive device.   ? Gait with Vertical Head Turns Performs head turns with no change in gait. Deviates no more than 6 in outside 12 in walkway width.   ? Gait and Pivot Turn Pivot turns safely within 3 sec and stops quickly with no loss of balance.   ? Step Over Obstacle Is able to step over one shoe box (4.5 in total height) without changing gait speed. No evidence of imbalance.   ? Gait with Narrow Base of Support Ambulates 7-9 steps.   after several tries  ? Gait with Eyes Closed Walks 20 ft, slow speed, abnormal gait pattern, evidence for imbalance, deviates 10-15 in outside 12 in walkway width. Requires more than 9 sec to ambulate 20 ft.   ? Ambulating Backwards Walks 20 ft, uses assistive device, slower speed, mild gait deviations, deviates 6-10 in outside 12 in walkway width.   ? Steps Alternating feet, must use rail.   ? Total Score 23   ? FGA comment: 19-24 = medium risk fall   ? ?  ?  ? ?  ? ? ?TODAY'S TREATMENT  ?Neuromuscular Re-education: to improve, balance, postural strength, muscle activation patterns, and stabilization strength required for functional activities: ?- FGA assessment for baseline (see above).  ? ?STANDING IN CORNER with chair in front:  ?- narrow stance with eyes closed, 2x1 min (with and without shoes donned) ?- half tandem stance, 2x1 min each side (with and without shoes donned).  ?- narrow stance with head turns, 2x20 each  direction, with and without shoe donned.  ?- alternating foot taps on 8.5 inch stool, 3x10 each side (last two sets without shoes).  ? ?Hurdle exercises (6 hurdles, 6 inches high, CGA):  ?- step to gait pattern 1x6 hurdles each foot leading.  ?- step over step 2x6 hurdles ? ?Education on HEP including handout  ? ?Pt required multimodal cuing for proper technique and to facilitate improved neuromuscular control, strength, range of motion, and functional ability resulting in improved performance and form. ? ?  ?  ?PATIENT EDUCATION:  ?Education details: Exercise purpose/form. Self management techniques.  ?Person educated: Patient ?Education method: Explanation, demonstration, verbal and tactile cuing.  ?Education comprehension: verbalized understanding, demonstrated understanding, and needs further education ?  ?  ?HOME EXERCISE PROGRAM: ?Access Code: [email or web address] ?URL: [email or web address] ?Date: 12/28/2021 ?Prepared  by: Rosita Kea ? ?Exercises ?- Corner Balance Feet Together With Eyes Closed  - 3-5 x weekly - 2 sets - 1 min hold ?- Standing Romberg to 1/2 Tandem Stance  - 3-5 x weekly - 2 reps - 1 minute hold ?- Corner Balance Feet Together: Eyes Open With Head Turns  - 3-5 x weekly - 2 sets - 20 reps ?- Standing Toe Taps  - 3-5 x weekly - 3 sets - 20 reps ?  ?ASSESSMENT: ?  ?CLINICAL IMPRESSION: ?Patient tolerated treatment well with no increase in symptoms by end of session. Patient scored 23/30 on Functional Gait Assessment which suggests moderate fall risk. Today's session focused on developing initial HEP for balance. Patient demonstrated good ability to perform exercises safely with good understanding. Plan to continue with balance exercises and introduce postural strengthening next session as appropriate. Patient would benefit from continued management of limiting condition by skilled physical therapist to address remaining impairments and functional limitations to work towards stated goals and  return to PLOF or maximal functional independence.  ?  ?Patient is a 88 y.o. male referred to outpatient physical therapy with a medical diagnosis of chronic midline back pain, unspecified back location who presents with sign

## 2021-12-28 ENCOUNTER — Encounter: Payer: Self-pay | Admitting: Physical Therapy

## 2021-12-28 ENCOUNTER — Ambulatory Visit: Payer: Medicare Other | Admitting: Physical Therapy

## 2021-12-28 DIAGNOSIS — M549 Dorsalgia, unspecified: Secondary | ICD-10-CM | POA: Diagnosis not present

## 2021-12-28 DIAGNOSIS — M546 Pain in thoracic spine: Secondary | ICD-10-CM | POA: Diagnosis not present

## 2021-12-28 DIAGNOSIS — R2681 Unsteadiness on feet: Secondary | ICD-10-CM | POA: Diagnosis not present

## 2021-12-28 DIAGNOSIS — R29818 Other symptoms and signs involving the nervous system: Secondary | ICD-10-CM

## 2021-12-28 DIAGNOSIS — G8929 Other chronic pain: Secondary | ICD-10-CM | POA: Diagnosis not present

## 2021-12-28 NOTE — Therapy (Signed)
?OUTPATIENT PHYSICAL THERAPY TREATMENT NOTE ? ? ?Patient Name: Rodney Delacruz ?MRN: [id number] ?DOB:09-08-1933, 88 y.o., male ?Today's Date: 12/29/2021 ? ?PCP: Angela Adam. Caryl Bis, MD ?REFERRING PROVIDER: Angela Adam. Caryl Bis, MD ? ?END OF SESSION:  ? PT End of Session - 12/29/21 1045   ? ? Visit Number 3   ? Number of Visits 25   ? Date for PT Re-Evaluation 03/17/22   ? Authorization Type MEDICARE PART B   ? Progress Note Due on Visit 10   ? PT Start Time 1040   ? PT Stop Time 1118   ? PT Time Calculation (min) 38 min   ? Equipment Utilized During Treatment Gait belt   ? Activity Tolerance Patient tolerated treatment well   ? Behavior During Therapy [facility] for tasks assessed/performed   ? ?  ?  ? ?  ? ? ? ?Past Medical History:  ?Diagnosis Date  ? Allergy   ? Anxiety   ? Coronary artery disease   ? Depression   ? Elevated PSA   ? Followed by Dr. Rosana Berger every 6 month  ? Heart murmur   ? Hyperlipidemia   ? Hypertension   ? MI (myocardial infarction) ([place]) 2009  ? Ulcer   ? ?Past Surgical History:  ?Procedure Laterality Date  ? CARDIAC CATHETERIZATION  2005, 2009  ? CARDIOVERSION N/A 02/23/2021  ? Procedure: CARDIOVERSION;  Surgeon: Minna Merritts, MD;  Location: [facility] ORS;  Service: Cardiovascular;  Laterality: N/A;  ? CHOLECYSTECTOMY  2001  ? CORONARY ANGIOPLASTY  2005  ? s/p stent placement @ [facility]  ? CORONARY ARTERY BYPASS GRAFT  2009  ? [facility]  ? LEFT HEART CATH AND CORS/GRAFTS ANGIOGRAPHY Left 07/07/2021  ? Procedure: LEFT HEART CATH AND CORS/GRAFTS ANGIOGRAPHY;  Surgeon: Wellington Hampshire, MD;  Location: [place] [facility];  Service: Cardiovascular;  Laterality: Left;  ? TONSILLECTOMY AND ADENOIDECTOMY  1942  ? ?Patient Active Problem List  ? Diagnosis Date Noted  ? Chronic back pain 12/13/2021  ? Atrial flutter ([place]) 02/12/2021  ? Weight loss 12/21/2020  ? Hammer toes of both feet 06/23/2020  ? Intermittent claudication ([place]) 06/23/2020  ? Kidney dysfunction 12/18/2019  ? GERD (gastroesophageal reflux disease)  03/19/2018  ? Left foot pain 09/19/2017  ? Decreased pedal pulses 09/19/2017  ? Prediabetes 09/19/2017  ? Erectile dysfunction 04/24/2017  ? Elevated PSA 07/20/2016  ? Gout 07/27/2015  ? Personal history of other malignant neoplasm of skin 07/16/2013  ? CAD (coronary artery disease) 10/11/2012  ? Mixed hyperlipidemia 10/11/2012  ? Essential hypertension 10/11/2012  ? S/P CABG (coronary artery bypass graft) 10/11/2012  ? ? ?REFERRING DIAG: chronic midline back pain, unspecified back location ? ?THERAPY DIAG:  ?Pain in thoracic spine ? ?Other symptoms and signs involving the nervous system ? ?Unsteadiness on feet ? ?ONSET DATE: "several months ago" ? ?PERTINENT HISTORY: Patient is a 88 y.o. male who presents to outpatient physical therapy with a referral for medical diagnosis chronic midline back pain, unspecified back location. This patient's chief complaints consist of numbness that feels like a nerve from the B big toe over the dorsal aspect of foot to lower third of anterior lower leg, L > R, and intermittent upper back pain between scapulae (<5 min), and feeling unsteady on feet leading to the following functional deficits: difficulty with shopping and community ambulation, confidence in weight bearing activities, confidence about future ability to ambulate, prolonged standing with UE use. Relevant past medical history and comorbidities include see above.  Patient  denies hx of cancer, stroke, seizures, diabetes, unexplained weight loss, unexplained changes in bowel or bladder problems, unexplained stumbling or dropping things, and spinal surgery ? ?PRECAUTIONS: fall? ? ?SUBJECTIVE: Patient reports he is feeling well with no pain upon arrival. He states he tried his HEP since his last visit yesterday and found that it is harder when he moves his foot forwards. He states the instructions made sense and he did not have a problem doing his HEP.  ? ?PAIN:  ?Are you having pain? no ? ? ?OBJECTIVE ? ?TODAY'S TREATMENT   ?Therapeutic exercise: to centralize symptoms and improve ROM, strength, muscular endurance, and activity tolerance required for successful completion of functional activities.  ?- NuStep level 4-5 using bilateral lower extremities. Seat setting 14. For improved extremity mobility, muscular endurance, and activity tolerance; and to induce the analgesic effect of aerobic exercise, stimulate improved joint nutrition, and prepare body structures and systems for following interventions. x 5:30 minutes. Average SPM = 90.  ? ?Neuromuscular Re-education: to improve, balance, postural strength, muscle activation patterns, and stabilization strength required for functional activities: ?   ?Standing static balance on airex pad (SBA, UE support as needed) ?- forward sway, self selected stance, 2x20 ?- eyes closed, self selected stance, 2x1 min ?- narrow stance, horizontal head turns, 2x20 each direction.  ? ?"Clock Yourself" task: ?- 1 min at 30 steps per minute including full clock face in order.  ?- 1 min at 30 steps per minute, random  ?- 2 min at 40 steps per minute, random ? ?- step standing pallof press with foot on flat side of half large spike ball. CGA, 2x10 each foot each side with BlueTB.  ?- cone weaving forward, lateral, backwards, lateral, 2x5 cones each direction with CGA -SBA.  ? ?Hurdle exercises (6 hurdles, 6 inches high, CGA):  ?- step to gait pattern 1x6 hurdles each foot leading.  ?- step over step 2x6 hurdles ? ?- step up and over large dynadisc leading up and down with ipsilateral leg, 1x10 leading with eadh foot, U UE support as need and CGA-min A  ? ?Pt required multimodal cuing for proper technique and to facilitate improved neuromuscular control, strength, range of motion, and functional ability resulting in improved performance and form. ? ?  ?  ?PATIENT EDUCATION:  ?Education details: Exercise purpose/form. Self management techniques.  ?Person educated: Patient ?Education method: Explanation,  demonstration, verbal and tactile cuing.  ?Education comprehension: verbalized understanding, demonstrated understanding, and needs further education ?  ?  ?HOME EXERCISE PROGRAM: ?Access Code: [email or web address] ?URL: [email or web address] ?Date: 12/28/2021 ?Prepared by: Rosita Kea ? ?Exercises ?- Corner Balance Feet Together With Eyes Closed  - 3-5 x weekly - 2 sets - 1 min hold ?- Standing Romberg to 1/2 Tandem Stance  - 3-5 x weekly - 2 reps - 1 minute hold ?- Corner Balance Feet Together: Eyes Open With Head Turns  - 3-5 x weekly - 2 sets - 20 reps ?- Standing Toe Taps  - 3-5 x weekly - 3 sets - 20 reps ?  ?ASSESSMENT: ?  ?CLINICAL IMPRESSION: ?Patient tolerated treatment well overall with no increase in pain by end of session. Visit focused on static and dynamic balance activities that were appropriately challenging. Plan to incorporate postural strengthening as appropriate next session. Patient would benefit from continued management of limiting condition by skilled physical therapist to address remaining impairments and functional limitations to work towards stated goals and return to PLOF or maximal functional independence.  ? ?  ?  Patient is a 88 y.o. male referred to outpatient physical therapy with a medical diagnosis of chronic midline back pain, unspecified back location who presents with signs and symptoms most consistent with peripheral neuropathy and associated imbalance, intermittent thoracic spine pain.  Patient presents with significant pain, paresthesia, sensation, joint mobility, balance, gait, posture, and muscle performance (strength/power/endurance), and activity tolerance impairments that are limiting ability to complete his usual activities such as shopping and community ambulation, confidence in weight bearing activities, confidence about future ability to ambulate, prolonged standing with UE use without difficulty. Patient will benefit from skilled physical therapy intervention to  address current body structure impairments and activity limitations to improve function and work towards goals set in current POC in order to return to prior level of function or maximal functional improvement.  ?

## 2021-12-29 ENCOUNTER — Encounter: Payer: Self-pay | Admitting: Physical Therapy

## 2021-12-29 ENCOUNTER — Ambulatory Visit: Payer: Medicare Other | Admitting: Physical Therapy

## 2021-12-29 DIAGNOSIS — R29818 Other symptoms and signs involving the nervous system: Secondary | ICD-10-CM

## 2021-12-29 DIAGNOSIS — R2681 Unsteadiness on feet: Secondary | ICD-10-CM | POA: Diagnosis not present

## 2021-12-29 DIAGNOSIS — G8929 Other chronic pain: Secondary | ICD-10-CM | POA: Diagnosis not present

## 2021-12-29 DIAGNOSIS — M546 Pain in thoracic spine: Secondary | ICD-10-CM | POA: Diagnosis not present

## 2021-12-29 DIAGNOSIS — M549 Dorsalgia, unspecified: Secondary | ICD-10-CM | POA: Diagnosis not present

## 2022-01-03 ENCOUNTER — Ambulatory Visit: Payer: Medicare Other | Admitting: Physical Therapy

## 2022-01-03 ENCOUNTER — Encounter: Payer: Self-pay | Admitting: Physical Therapy

## 2022-01-03 DIAGNOSIS — R2681 Unsteadiness on feet: Secondary | ICD-10-CM | POA: Diagnosis not present

## 2022-01-03 DIAGNOSIS — M549 Dorsalgia, unspecified: Secondary | ICD-10-CM | POA: Diagnosis not present

## 2022-01-03 DIAGNOSIS — M546 Pain in thoracic spine: Secondary | ICD-10-CM

## 2022-01-03 DIAGNOSIS — R29818 Other symptoms and signs involving the nervous system: Secondary | ICD-10-CM | POA: Diagnosis not present

## 2022-01-03 DIAGNOSIS — G8929 Other chronic pain: Secondary | ICD-10-CM | POA: Diagnosis not present

## 2022-01-03 NOTE — Therapy (Signed)
OUTPATIENT PHYSICAL THERAPY TREATMENT NOTE   Patient Name: Rodney Delacruz MRN: [id number] DOB:[birth year], 88 y.o., male Today's Date: 01/03/2022  PCP: Angela Adam. Caryl Bis, MD REFERRING PROVIDER: Angela Adam. Caryl Bis, MD  END OF SESSION:   PT End of Session - 01/03/22 1610     Visit Number 4    Number of Visits 25    Date for PT Re-Evaluation 03/17/22    Authorization Type MEDICARE PART B reporting period from 12/23/2021    Progress Note Due on Visit 10    PT Start Time 1608    PT Stop Time 1646    PT Time Calculation (min) 38 min    Equipment Utilized During Treatment Gait belt    Activity Tolerance Patient tolerated treatment well    Behavior During Therapy WFL for tasks assessed/performed               Past Medical History:  Diagnosis Date   Allergy    Anxiety    Coronary artery disease    Depression    Elevated PSA    Followed by Dr. Rosana Berger every 6 month   Heart murmur    Hyperlipidemia    Hypertension    MI (myocardial infarction) [facility]) 2009   Ulcer    Past Surgical History:  Procedure Laterality Date   CARDIAC CATHETERIZATION  2005, 2009   CARDIOVERSION N/A 02/23/2021   Procedure: CARDIOVERSION;  Surgeon: Minna Merritts, MD;  Location: [facility];  Service: Cardiovascular;  Laterality: N/A;   CHOLECYSTECTOMY  2001   CORONARY ANGIOPLASTY  2005   s/p stent placement @ [facility]   CORONARY ARTERY BYPASS GRAFT  2009   [facility]   LEFT HEART CATH AND CORS/GRAFTS ANGIOGRAPHY Left 07/07/2021   Procedure: LEFT HEART CATH AND CORS/GRAFTS ANGIOGRAPHY;  Surgeon: Wellington Hampshire, MD;  Location: [place] [facility];  Service: Cardiovascular;  Laterality: Left;   TONSILLECTOMY AND ADENOIDECTOMY  1942   Patient Active Problem List   Diagnosis Date Noted   Chronic back pain 12/13/2021   Atrial flutter ([place]) 02/12/2021   Weight loss 12/21/2020   Hammer toes of both feet 06/23/2020   Intermittent claudication ([place]) 06/23/2020   Kidney dysfunction 12/18/2019   GERD  (gastroesophageal reflux disease) 03/19/2018   Left foot pain 09/19/2017   Decreased pedal pulses 09/19/2017   Prediabetes 09/19/2017   Erectile dysfunction 04/24/2017   Elevated PSA 07/20/2016   Gout 07/27/2015   Personal history of other malignant neoplasm of skin 07/16/2013   CAD (coronary artery disease) 10/11/2012   Mixed hyperlipidemia 10/11/2012   Essential hypertension 10/11/2012   S/P CABG (coronary artery bypass graft) 10/11/2012    REFERRING DIAG: chronic midline back pain, unspecified back location  THERAPY DIAG:  Pain in thoracic spine  Other symptoms and signs involving the nervous system  Unsteadiness on feet  Rationale for Evaluation and Treatment: Rehabilitation  ONSET DATE: "several months ago"  PERTINENT HISTORY: Patient is a 88 y.o. male who presents to outpatient physical therapy with a referral for medical diagnosis chronic midline back pain, unspecified back location. This patient's chief complaints consist of numbness that feels like a nerve from the B big toe over the dorsal aspect of foot to lower third of anterior lower leg, L > R, and intermittent upper back pain between scapulae (<5 min), and feeling unsteady on feet leading to the following functional deficits: difficulty with shopping and community ambulation, confidence in weight bearing activities, confidence about future ability to ambulate, prolonged standing with UE  use. Relevant past medical history and comorbidities include see above.  Patient denies hx of cancer, stroke, seizures, diabetes, unexplained weight loss, unexplained changes in bowel or bladder problems, unexplained stumbling or dropping things, and spinal surgery  PRECAUTIONS: fall?  SUBJECTIVE: Patient reports he is feeling well and has no pain upon arrival. He states completing his HEP has been "slow" but he did do them one day. He states he felt fine after last PT session. He hasn't noticed any difference in his balance so far.  States his ankle pain has improved since starting PT.   PAIN:  Are you having pain? no   OBJECTIVE  SELF-REPORTED FUNCTION FOTO score: 58/100 (balance questionnaire)  TODAY'S TREATMENT  Therapeutic exercise: to centralize symptoms and improve ROM, strength, muscular endurance, and activity tolerance required for successful completion of functional activities.  - NuStep level 3-5 using bilateral lower extremities. Seat setting 14. For improved extremity mobility, muscular endurance, and activity tolerance; and to induce the analgesic effect of aerobic exercise, stimulate improved joint nutrition, and prepare body structures and systems for following interventions. x 6 minutes. Average SPM = 90.   Neuromuscular Re-education: to improve, balance, postural strength, muscle activation patterns, and stabilization strength required for functional activities:  "Clock Yourself" task: - 3x2 min at 40/50/60 steps per minute, random  - cone push over and lever back up with one foot, 2x10 each side progressing from U UE support to intermittent UE support.   Standing static balance on airex pad (SBA, UE support as needed) - forward sway, self selected stance, 1x20  - step up and over large dynadisc leading up and down with ipsilateral leg, 1x25 leading with eadh foot, U UE support as need and CGA-min A   Pt required multimodal cuing for proper technique and to facilitate improved neuromuscular control, strength, range of motion, and functional ability resulting in improved performance and form.      PATIENT EDUCATION:  Education details: Exercise purpose/form. Self management techniques.  Person educated: Patient Education method: Explanation, demonstration, verbal and tactile cuing.  Education comprehension: verbalized understanding, demonstrated understanding, and needs further education     HOME EXERCISE PROGRAM: Access Code: [email or web address] URL: [email or web address] Date:  12/28/2021 Prepared by: Rosita Kea  Exercises - Corner Balance Feet Together With Eyes Closed  - 3-5 x weekly - 2 sets - 1 min hold - Standing Romberg to 1/2 Tandem Stance  - 3-5 x weekly - 2 reps - 1 minute hold - Corner Balance Feet Together: Eyes Open With Head Turns  - 3-5 x weekly - 2 sets - 20 reps - Standing Toe Taps  - 3-5 x weekly - 3 sets - 20 reps   ASSESSMENT:   CLINICAL IMPRESSION: Patient tolerated treatment well overall and reports decreased ankle pain since starting PT. Continued working on challenging balance activities such as standing on one foot long enough to manipulate a cone with the other foot. Plan to continue working on challenging balance activities as appropriate next session. Patient would benefit from continued management of limiting condition by skilled physical therapist to address remaining impairments and functional limitations to work towards stated goals and return to PLOF or maximal functional independence.   Patient is a 88 y.o. male referred to outpatient physical therapy with a medical diagnosis of chronic midline back pain, unspecified back location who presents with signs and symptoms most consistent with peripheral neuropathy and associated imbalance, intermittent thoracic spine pain.  Patient presents with significant pain, paresthesia, sensation, joint mobility, balance,  gait, posture, and muscle performance (strength/power/endurance), and activity tolerance impairments that are limiting ability to complete his usual activities such as shopping and community ambulation, confidence in weight bearing activities, confidence about future ability to ambulate, prolonged standing with UE use without difficulty. Patient will benefit from skilled physical therapy intervention to address current body structure impairments and activity limitations to improve function and work towards goals set in current POC in order to return to prior level of function or maximal  functional improvement.    OBJECTIVE IMPAIRMENTS decreased activity tolerance, decreased balance, decreased endurance, decreased knowledge of condition, decreased mobility, difficulty walking, decreased strength, hypomobility, impaired flexibility, impaired sensation, improper body mechanics, postural dysfunction, and pain.    ACTIVITY LIMITATIONS meal prep and shopping, community ambulation, confidence in weight bearing activities, confidence about future ability to ambulate, prolonged standing with UE use   PERSONAL FACTORS Age, Fitness, Past/current experiences, Time since onset of injury/illness/exacerbation, and 3+ comorbidities: see below  are also affecting patient's functional outcome.    Comorbid conditions: Chronic back pain Atrial flutter (HCC) Weight loss Hammer toes of both feet Intermittent claudication (HCC) Kidney dysfunction GERD (gastroesophageal reflux disease) Left foot pain Decreased pedal pulses Prediabetes Erectile dysfunction Elevated PSA Gout Personal history of other malignant neoplasm of skin CAD (coronary artery disease) Mixed hyperlipidemia Essential hypertension S/P CABG (coronary artery bypass graft) Allergy Anxiety Coronary artery disease Depression Elevated PSA Followed by Dr. Rosana Berger every 6 month Heart murmur Hyperlipidemia Hypertension MI (myocardial infarction) ([place]) Ulcer CARDIAC CATHETERIZATION CARDIOVERSION Procedure: CARDIOVERSION;  Surgeon: Minna Merritts, MD;  Location: [facility];  Service: Cardiovascular;  Laterality: N/A; CHOLECYSTECTOMY CORONARY ANGIOPLASTY s/p stent placement @ [facility] CORONARY ARTERY BYPASS GRAFT LEFT HEART CATH AND CORS/GRAFTS ANGIOGRAPHY Procedure: LEFT HEART CATH AND CORS/GRAFTS ANGIOGRAPHY;  Surgeon: Wellington Hampshire, MD;  Location: [place] [facility];  Service: Cardiovascular;  Laterality: Left; TONSILLECTOMY AND ADENOIDECTOMY         REHAB POTENTIAL: Good   CLINICAL DECISION MAKING:  Stable/uncomplicated   EVALUATION COMPLEXITY: Low     GOALS: Goals reviewed with patient? No   SHORT TERM GOALS: Target date: 12/30/2021   Patient will be independent with initial home exercise program for self-management of symptoms. Baseline: Initial HEP to be provided at visit 2 as appropriate (12/16/21); initial HEP provided at visit #2 (12/28/2021);  Goal status: In-progress     LONG TERM GOALS: Target date: 03/10/2022   Patient will be independent with a long-term home exercise program for self-management of symptoms.  Baseline: Initial HEP to be provided at visit 2 as appropriate (12/16/21); initial HEP provided at visit #2 (12/28/2021);  Goal status: In-progress   2.  Patient will demonstrate improved FOTO by equal or greater than 10 points to demonstrate improvement in overall condition and self-reported functional ability.  Baseline: to be measured visit 2 as appropriate (12/16/21);  58 at visit #4 (01/03/2022);  Goal status: In-progress   3.  Patient will score equal or greater than 25/30 on Functional Gait Assessment to demonstrate low fall risk.  Baseline: to be measured visit 2 as appropriate  (12/16/21); 23/30 moderate fall risk (12/28/2021);  Goal status: In-Progress   4.  Patient will report equal or less than 1/10 pain during functional activity to improve his ability to stand and complete repetitive UE activities such as meal prep.  Baseline: 8/10 (12/16/21); Goal status: In-progress   5.  Patient will complete community, work and/or recreational activities without limitation due to current condition.  Baseline: shopping and community  ambulation, confidence in weight bearing activities, confidence about future ability to ambulate, prolonged standing with UE use (12/16/21); Goal status: In-progress   6.  Patient will demonstrate ability to stand in narrow stance on firm surface with eyes closed for equal or greater than 30 seconds to improve his ability to ambulate  safely in low light situations.  Baseline: 5 seconds (12/23/2021);  Goal status: In-progress     PLAN: PT FREQUENCY: 1-2x/week   PT DURATION: 12 weeks   PLANNED INTERVENTIONS: Therapeutic exercises, Therapeutic activity, Neuromuscular re-education, Balance training, Gait training, Patient/Family education, Joint mobilization, DME instructions, Dry Needling, Spinal mobilization, Cryotherapy, Moist heat, and Manual therapy.   PLAN FOR NEXT SESSION: update HEP as appropriate, postural strengthening, LE/core/functional strengthening and balance activities as appropriate.     Everlean Alstrom. Graylon Good, PT, DPT 01/03/22, 8:30 PM  [facility] [address] [place], [place] [address] P: [phone] I F: [phone]

## 2022-01-04 NOTE — Therapy (Signed)
OUTPATIENT PHYSICAL THERAPY TREATMENT NOTE   Patient Name: Rodney Delacruz MRN: [id number] DOB:[birth year], 88 y.o., male Today's Date: 01/05/2022  PCP: Angela Adam. Caryl Bis, MD REFERRING PROVIDER: Angela Adam. Caryl Bis, MD  END OF SESSION:   PT End of Session - 01/05/22 1955     Visit Number 5    Number of Visits 25    Date for PT Re-Evaluation 03/17/22    Authorization Type MEDICARE PART B reporting period from 12/23/2021    Progress Note Due on Visit 10    PT Start Time 1037    PT Stop Time 1115    PT Time Calculation (min) 38 min    Equipment Utilized During Treatment Gait belt    Activity Tolerance Patient tolerated treatment well    Behavior During Therapy WFL for tasks assessed/performed                Past Medical History:  Diagnosis Date   Allergy    Anxiety    Coronary artery disease    Depression    Elevated PSA    Followed by Dr. Rosana Berger every 6 month   Heart murmur    Hyperlipidemia    Hypertension    MI (myocardial infarction) [facility]) 2009   Ulcer    Past Surgical History:  Procedure Laterality Date   CARDIAC CATHETERIZATION  2005, 2009   CARDIOVERSION N/A 02/23/2021   Procedure: CARDIOVERSION;  Surgeon: Minna Merritts, MD;  Location: [facility] ORS;  Service: Cardiovascular;  Laterality: N/A;   CHOLECYSTECTOMY  2001   CORONARY ANGIOPLASTY  2005   s/p stent placement @ [facility]   CORONARY ARTERY BYPASS GRAFT  2009   [facility]   LEFT HEART CATH AND CORS/GRAFTS ANGIOGRAPHY Left 07/07/2021   Procedure: LEFT HEART CATH AND CORS/GRAFTS ANGIOGRAPHY;  Surgeon: Wellington Hampshire, MD;  Location: [place] [facility];  Service: Cardiovascular;  Laterality: Left;   TONSILLECTOMY AND ADENOIDECTOMY  1942   Patient Active Problem List   Diagnosis Date Noted   Chronic back pain 12/13/2021   Atrial flutter ([place]) 02/12/2021   Weight loss 12/21/2020   Hammer toes of both feet 06/23/2020   Intermittent claudication ([place]) 06/23/2020   Kidney dysfunction 12/18/2019   GERD  (gastroesophageal reflux disease) 03/19/2018   Left foot pain 09/19/2017   Decreased pedal pulses 09/19/2017   Prediabetes 09/19/2017   Erectile dysfunction 04/24/2017   Elevated PSA 07/20/2016   Gout 07/27/2015   Personal history of other malignant neoplasm of skin 07/16/2013   CAD (coronary artery disease) 10/11/2012   Mixed hyperlipidemia 10/11/2012   Essential hypertension 10/11/2012   S/P CABG (coronary artery bypass graft) 10/11/2012    REFERRING DIAG: chronic midline back pain, unspecified back location  THERAPY DIAG:  Pain in thoracic spine  Other symptoms and signs involving the nervous system  Unsteadiness on feet  Rationale for Evaluation and Treatment: Rehabilitation  ONSET DATE: "several months ago"  PERTINENT HISTORY: Patient is a 88 y.o. male who presents to outpatient physical therapy with a referral for medical diagnosis chronic midline back pain, unspecified back location. This patient's chief complaints consist of numbness that feels like a nerve from the B big toe over the dorsal aspect of foot to lower third of anterior lower leg, L > R, and intermittent upper back pain between scapulae (<5 min), and feeling unsteady on feet leading to the following functional deficits: difficulty with shopping and community ambulation, confidence in weight bearing activities, confidence about future ability to ambulate, prolonged standing with  UE use. Relevant past medical history and comorbidities include see above.  Patient denies hx of cancer, stroke, seizures, diabetes, unexplained weight loss, unexplained changes in bowel or bladder problems, unexplained stumbling or dropping things, and spinal surgery  PRECAUTIONS: fall?  SUBJECTIVE: Patient reports he is feeling well today and has no pain upon arrival. He states his lower legs and ankles were a bit sore after last PT session and he has done his HEP more often since last PT session.   PAIN:  Are you having pain?  no   OBJECTIVE  TODAY'S TREATMENT  Neuromuscular Re-education: to improve, balance, postural strength, muscle activation patterns, and stabilization strength required for functional activities:  "Clock Yourself" task: - 3x2 min at 60/60/70 steps per minute, random. Cuing to improve weigh transfer 2nd set.  - cone push over and lever back up with one foot, 3x10 each side, occasional U UE touchdown support as needed.  - runner's step up to 8.5 inch step with intermittent U UE support, 2x10 each side. Attempting to balance at top of step. - ball toss at rebounder with 2kg med ball: 1x10 on firm surface and self selected stance to get used to tossing ball, 3x10 with each foot front in tandem stance on firm surface. Also a few reps of narrow stance completed on airex pad but was found to be too easy.  SBA.  - step up and over large dynadisc leading up and down with ipsilateral leg, 1x25 leading with eadh foot, U UE support as need and CGA-min A    Pt required multimodal cuing for proper technique and to facilitate improved neuromuscular control, strength, range of motion, and functional ability resulting in improved performance and form.      PATIENT EDUCATION:  Education details: Exercise purpose/form. Self management techniques.  Person educated: Patient Education method: Explanation, demonstration, verbal and tactile cuing.  Education comprehension: verbalized understanding, demonstrated understanding, and needs further education     HOME EXERCISE PROGRAM: Access Code: [email or web address] URL: [email or web address] Date: 12/28/2021 Prepared by: Rosita Kea  Exercises - Corner Balance Feet Together With Eyes Closed  - 3-5 x weekly - 2 sets - 1 min hold - Standing Romberg to 1/2 Tandem Stance  - 3-5 x weekly - 2 reps - 1 minute hold - Corner Balance Feet Together: Eyes Open With Head Turns  - 3-5 x weekly - 2 sets - 20 reps - Standing Toe Taps  - 3-5 x weekly - 3 sets - 20 reps    ASSESSMENT:   CLINICAL IMPRESSION: Patient tolerated treatment well overall and continues to show improvements in balance and confidence. He had the most difficulty with ball toss in tandem stance and runner's step up to balance. He reported feeling unsteady like he does when using stairs. Plan to continue working on functional balance next session as appropriate. Patient required SBA- CGA for safety but was able to catch himself with step strategy when he lost his balance occasionally. Patient would benefit from continued management of limiting condition by skilled physical therapist to address remaining impairments and functional limitations to work towards stated goals and return to PLOF or maximal functional independence.   Patient is a 88 y.o. male referred to outpatient physical therapy with a medical diagnosis of chronic midline back pain, unspecified back location who presents with signs and symptoms most consistent with peripheral neuropathy and associated imbalance, intermittent thoracic spine pain.  Patient presents with significant pain, paresthesia, sensation, joint mobility, balance, gait, posture, and muscle performance (  strength/power/endurance), and activity tolerance impairments that are limiting ability to complete his usual activities such as shopping and community ambulation, confidence in weight bearing activities, confidence about future ability to ambulate, prolonged standing with UE use without difficulty. Patient will benefit from skilled physical therapy intervention to address current body structure impairments and activity limitations to improve function and work towards goals set in current POC in order to return to prior level of function or maximal functional improvement.    OBJECTIVE IMPAIRMENTS decreased activity tolerance, decreased balance, decreased endurance, decreased knowledge of condition, decreased mobility, difficulty walking, decreased strength, hypomobility,  impaired flexibility, impaired sensation, improper body mechanics, postural dysfunction, and pain.    ACTIVITY LIMITATIONS meal prep and shopping, community ambulation, confidence in weight bearing activities, confidence about future ability to ambulate, prolonged standing with UE use   PERSONAL FACTORS Age, Fitness, Past/current experiences, Time since onset of injury/illness/exacerbation, and 3+ comorbidities: see below  are also affecting patient's functional outcome.    Comorbid conditions: Chronic back pain Atrial flutter (HCC) Weight loss Hammer toes of both feet Intermittent claudication (HCC) Kidney dysfunction GERD (gastroesophageal reflux disease) Left foot pain Decreased pedal pulses Prediabetes Erectile dysfunction Elevated PSA Gout Personal history of other malignant neoplasm of skin CAD (coronary artery disease) Mixed hyperlipidemia Essential hypertension S/P CABG (coronary artery bypass graft) Allergy Anxiety Coronary artery disease Depression Elevated PSA Followed by Dr. Rosana Berger every 6 month Heart murmur Hyperlipidemia Hypertension MI (myocardial infarction) ([place]) Ulcer CARDIAC CATHETERIZATION CARDIOVERSION Procedure: CARDIOVERSION;  Surgeon: Minna Merritts, MD;  Location: [facility] ORS;  Service: Cardiovascular;  Laterality: N/A; CHOLECYSTECTOMY CORONARY ANGIOPLASTY s/p stent placement @ [facility] CORONARY ARTERY BYPASS GRAFT LEFT HEART CATH AND CORS/GRAFTS ANGIOGRAPHY Procedure: LEFT HEART CATH AND CORS/GRAFTS ANGIOGRAPHY;  Surgeon: Wellington Hampshire, MD;  Location: [place] [facility];  Service: Cardiovascular;  Laterality: Left; TONSILLECTOMY AND ADENOIDECTOMY         REHAB POTENTIAL: Good   CLINICAL DECISION MAKING: Stable/uncomplicated   EVALUATION COMPLEXITY: Low     GOALS: Goals reviewed with patient? No   SHORT TERM GOALS: Target date: 12/30/2021   Patient will be independent with initial home exercise program for self-management of  symptoms. Baseline: Initial HEP to be provided at visit 2 as appropriate (12/16/21); initial HEP provided at visit #2 (12/28/2021);  Goal status: In-progress     LONG TERM GOALS: Target date: 03/10/2022   Patient will be independent with a long-term home exercise program for self-management of symptoms.  Baseline: Initial HEP to be provided at visit 2 as appropriate (12/16/21); initial HEP provided at visit #2 (12/28/2021);  Goal status: In-progress   2.  Patient will demonstrate improved FOTO by equal or greater than 10 points to demonstrate improvement in overall condition and self-reported functional ability.  Baseline: to be measured visit 2 as appropriate (12/16/21);  58 at visit #4 (01/03/2022);  Goal status: In-progress   3.  Patient will score equal or greater than 25/30 on Functional Gait Assessment to demonstrate low fall risk.  Baseline: to be measured visit 2 as appropriate  (12/16/21); 23/30 moderate fall risk (12/28/2021);  Goal status: In-Progress   4.  Patient will report equal or less than 1/10 pain during functional activity to improve his ability to stand and complete repetitive UE activities such as meal prep.  Baseline: 8/10 (12/16/21); Goal status: In-progress   5.  Patient will complete community, work and/or recreational activities without limitation due to current condition.  Baseline: shopping and community ambulation, confidence in weight bearing  activities, confidence about future ability to ambulate, prolonged standing with UE use (12/16/21); Goal status: In-progress   6.  Patient will demonstrate ability to stand in narrow stance on firm surface with eyes closed for equal or greater than 30 seconds to improve his ability to ambulate safely in low light situations.  Baseline: 5 seconds (12/23/2021);  Goal status: In-progress     PLAN: PT FREQUENCY: 1-2x/week   PT DURATION: 12 weeks   PLANNED INTERVENTIONS: Therapeutic exercises, Therapeutic activity,  Neuromuscular re-education, Balance training, Gait training, Patient/Family education, Joint mobilization, DME instructions, Dry Needling, Spinal mobilization, Cryotherapy, Moist heat, and Manual therapy.   PLAN FOR NEXT SESSION: update HEP as appropriate, postural strengthening, LE/core/functional strengthening and balance activities as appropriate.     Everlean Alstrom. Graylon Good, PT, DPT 01/05/22, 8:00 PM  [facility] [address] [place], [place] [address] P: [phone] I F: [phone]

## 2022-01-05 ENCOUNTER — Encounter: Payer: Self-pay | Admitting: Physical Therapy

## 2022-01-05 ENCOUNTER — Ambulatory Visit: Payer: Medicare Other | Admitting: Physical Therapy

## 2022-01-05 DIAGNOSIS — R2681 Unsteadiness on feet: Secondary | ICD-10-CM | POA: Diagnosis not present

## 2022-01-05 DIAGNOSIS — M549 Dorsalgia, unspecified: Secondary | ICD-10-CM | POA: Diagnosis not present

## 2022-01-05 DIAGNOSIS — M546 Pain in thoracic spine: Secondary | ICD-10-CM | POA: Diagnosis not present

## 2022-01-05 DIAGNOSIS — G8929 Other chronic pain: Secondary | ICD-10-CM | POA: Diagnosis not present

## 2022-01-05 DIAGNOSIS — R29818 Other symptoms and signs involving the nervous system: Secondary | ICD-10-CM | POA: Diagnosis not present

## 2022-01-06 NOTE — Therapy (Incomplete)
OUTPATIENT PHYSICAL THERAPY TREATMENT NOTE   Patient Name: Rodney Delacruz MRN: [id number] DOB:[birth year], 88 y.o., male Today's Date: 01/06/2022  PCP: Angela Adam. Caryl Bis, MD REFERRING PROVIDER: Angela Adam. Caryl Bis, MD  END OF SESSION:        Past Medical History:  Diagnosis Date   Allergy    Anxiety    Coronary artery disease    Depression    Elevated PSA    Followed by Dr. Rosana Berger every 6 month   Heart murmur    Hyperlipidemia    Hypertension    MI (myocardial infarction) [facility] 2009   Ulcer    Past Surgical History:  Procedure Laterality Date   CARDIAC CATHETERIZATION  2005, 2009   CARDIOVERSION N/A 02/23/2021   Procedure: CARDIOVERSION;  Surgeon: Minna Merritts, MD;  Location: [facility] ORS;  Service: Cardiovascular;  Laterality: N/A;   CHOLECYSTECTOMY  2001   CORONARY ANGIOPLASTY  2005   s/p stent placement @ [facility]   CORONARY ARTERY BYPASS GRAFT  2009   [facility]   LEFT HEART CATH AND CORS/GRAFTS ANGIOGRAPHY Left 07/07/2021   Procedure: LEFT HEART CATH AND CORS/GRAFTS ANGIOGRAPHY;  Surgeon: Wellington Hampshire, MD;  Location: [place] [facility];  Service: Cardiovascular;  Laterality: Left;   TONSILLECTOMY AND ADENOIDECTOMY  1942   Patient Active Problem List   Diagnosis Date Noted   Chronic back pain 12/13/2021   Atrial flutter ([place]) 02/12/2021   Weight loss 12/21/2020   Hammer toes of both feet 06/23/2020   Intermittent claudication ([place]) 06/23/2020   Kidney dysfunction 12/18/2019   GERD (gastroesophageal reflux disease) 03/19/2018   Left foot pain 09/19/2017   Decreased pedal pulses 09/19/2017   Prediabetes 09/19/2017   Erectile dysfunction 04/24/2017   Elevated PSA 07/20/2016   Gout 07/27/2015   Personal history of other malignant neoplasm of skin 07/16/2013   CAD (coronary artery disease) 10/11/2012   Mixed hyperlipidemia 10/11/2012   Essential hypertension 10/11/2012   S/P CABG (coronary artery bypass graft) 10/11/2012    REFERRING DIAG: chronic midline  back pain, unspecified back location  THERAPY DIAG:  No diagnosis found.  Rationale for Evaluation and Treatment: Rehabilitation  ONSET DATE: "several months ago"  PERTINENT HISTORY: Patient is a 88 y.o. male who presents to outpatient physical therapy with a referral for medical diagnosis chronic midline back pain, unspecified back location. This patient's chief complaints consist of numbness that feels like a nerve from the B big toe over the dorsal aspect of foot to lower third of anterior lower leg, L > R, and intermittent upper back pain between scapulae (<5 min), and feeling unsteady on feet leading to the following functional deficits: difficulty with shopping and community ambulation, confidence in weight bearing activities, confidence about future ability to ambulate, prolonged standing with UE use. Relevant past medical history and comorbidities include see above.  Patient denies hx of cancer, stroke, seizures, diabetes, unexplained weight loss, unexplained changes in bowel or bladder problems, unexplained stumbling or dropping things, and spinal surgery  PRECAUTIONS: fall?  SUBJECTIVE: Patient reports he is feeling well today and has no pain upon arrival. He states his lower legs and ankles were a bit sore after last PT session and he has done his HEP more often since last PT session.   PAIN:  Are you having pain? no   OBJECTIVE  TODAY'S TREATMENT  Neuromuscular Re-education: to improve, balance, postural strength, muscle activation patterns, and stabilization strength required for functional activities:  "Clock Yourself" task: - 3x2 min at 60/60/70  steps per minute, random. Cuing to improve weigh transfer 2nd set.  - cone push over and lever back up with one foot, 3x10 each side, occasional U UE touchdown support as needed.  - runner's step up to 8.5 inch step with intermittent U UE support, 2x10 each side. Attempting to balance at top of step. - ball toss at rebounder with  2kg med ball: 1x10 on firm surface and self selected stance to get used to tossing ball, 3x10 with each foot front in tandem stance on firm surface. Also a few reps of narrow stance completed on airex pad but was found to be too easy.  SBA.  - step up and over large dynadisc leading up and down with ipsilateral leg, 1x25 leading with eadh foot, U UE support as need and CGA-min A    Pt required multimodal cuing for proper technique and to facilitate improved neuromuscular control, strength, range of motion, and functional ability resulting in improved performance and form.      PATIENT EDUCATION:  Education details: Exercise purpose/form. Self management techniques.  Person educated: Patient Education method: Explanation, demonstration, verbal and tactile cuing.  Education comprehension: verbalized understanding, demonstrated understanding, and needs further education     HOME EXERCISE PROGRAM: Access Code: [email or web address] URL: [email or web address] Date: 12/28/2021 Prepared by: Rosita Kea  Exercises - Corner Balance Feet Together With Eyes Closed  - 3-5 x weekly - 2 sets - 1 min hold - Standing Romberg to 1/2 Tandem Stance  - 3-5 x weekly - 2 reps - 1 minute hold - Corner Balance Feet Together: Eyes Open With Head Turns  - 3-5 x weekly - 2 sets - 20 reps - Standing Toe Taps  - 3-5 x weekly - 3 sets - 20 reps   ASSESSMENT:   CLINICAL IMPRESSION: Patient tolerated treatment well overall and continues to show improvements in balance and confidence. He had the most difficulty with ball toss in tandem stance and runner's step up to balance. He reported feeling unsteady like he does when using stairs. Plan to continue working on functional balance next session as appropriate. Patient required SBA- CGA for safety but was able to catch himself with step strategy when he lost his balance occasionally. Patient would benefit from continued management of limiting condition by skilled  physical therapist to address remaining impairments and functional limitations to work towards stated goals and return to PLOF or maximal functional independence.   Patient is a 88 y.o. male referred to outpatient physical therapy with a medical diagnosis of chronic midline back pain, unspecified back location who presents with signs and symptoms most consistent with peripheral neuropathy and associated imbalance, intermittent thoracic spine pain.  Patient presents with significant pain, paresthesia, sensation, joint mobility, balance, gait, posture, and muscle performance (strength/power/endurance), and activity tolerance impairments that are limiting ability to complete his usual activities such as shopping and community ambulation, confidence in weight bearing activities, confidence about future ability to ambulate, prolonged standing with UE use without difficulty. Patient will benefit from skilled physical therapy intervention to address current body structure impairments and activity limitations to improve function and work towards goals set in current POC in order to return to prior level of function or maximal functional improvement.    OBJECTIVE IMPAIRMENTS decreased activity tolerance, decreased balance, decreased endurance, decreased knowledge of condition, decreased mobility, difficulty walking, decreased strength, hypomobility, impaired flexibility, impaired sensation, improper body mechanics, postural dysfunction, and pain.    ACTIVITY LIMITATIONS meal prep and shopping, community ambulation, confidence  in weight bearing activities, confidence about future ability to ambulate, prolonged standing with UE use   PERSONAL FACTORS Age, Fitness, Past/current experiences, Time since onset of injury/illness/exacerbation, and 3+ comorbidities: see below  are also affecting patient's functional outcome.    Comorbid conditions: Chronic back pain Atrial flutter (HCC) Weight loss Hammer toes of both  feet Intermittent claudication (HCC) Kidney dysfunction GERD (gastroesophageal reflux disease) Left foot pain Decreased pedal pulses Prediabetes Erectile dysfunction Elevated PSA Gout Personal history of other malignant neoplasm of skin CAD (coronary artery disease) Mixed hyperlipidemia Essential hypertension S/P CABG (coronary artery bypass graft) Allergy Anxiety Coronary artery disease Depression Elevated PSA Followed by Dr. Rosana Berger every 6 month Heart murmur Hyperlipidemia Hypertension MI (myocardial infarction) ([place]) Ulcer CARDIAC CATHETERIZATION CARDIOVERSION Procedure: CARDIOVERSION;  Surgeon: Minna Merritts, MD;  Location: [facility] OR[facility];  Service: Cardiovascular;  Laterality: N/A; CHOLECYSTECTOMY CORONARY ANGIOPLASTY s/p stent placement @ [facility] CORONARY ARTERY BYPASS GRAFT LEFT HEART CATH AND CORS/GRAFTS ANGIOGRAPHY Procedure: LEFT HEART CATH AND CORS/GRAFTS ANGIOGRAPHY;  Surgeon: Wellington Hampshire, MD;  Location: [place] [facility];  Service: Cardiovascular;  Laterality: Left; TONSILLECTOMY AND ADENOIDECTOMY         REHAB POTENTIAL: Good   CLINICAL DECISION MAKING: Stable/uncomplicated   EVALUATION COMPLEXITY: Low     GOALS: Goals reviewed with patient? No   SHORT TERM GOALS: Target date: 12/30/2021   Patient will be independent with initial home exercise program for self-management of symptoms. Baseline: Initial HEP to be provided at visit 2 as appropriate (12/16/21); initial HEP provided at visit #2 (12/28/2021);  Goal status: In-progress     LONG TERM GOALS: Target date: 03/10/2022   Patient will be independent with a long-term home exercise program for self-management of symptoms.  Baseline: Initial HEP to be provided at visit 2 as appropriate (12/16/21); initial HEP provided at visit #2 (12/28/2021);  Goal status: In-progress   2.  Patient will demonstrate improved FOTO by equal or greater than 10 points to demonstrate improvement in overall  condition and self-reported functional ability.  Baseline: to be measured visit 2 as appropriate (12/16/21);  58 at visit #4 (01/03/2022);  Goal status: In-progress   3.  Patient will score equal or greater than 25/30 on Functional Gait Assessment to demonstrate low fall risk.  Baseline: to be measured visit 2 as appropriate  (12/16/21); 23/30 moderate fall risk (12/28/2021);  Goal status: In-Progress   4.  Patient will report equal or less than 1/10 pain during functional activity to improve his ability to stand and complete repetitive UE activities such as meal prep.  Baseline: 8/10 (12/16/21); Goal status: In-progress   5.  Patient will complete community, work and/or recreational activities without limitation due to current condition.  Baseline: shopping and community ambulation, confidence in weight bearing activities, confidence about future ability to ambulate, prolonged standing with UE use (12/16/21); Goal status: In-progress   6.  Patient will demonstrate ability to stand in narrow stance on firm surface with eyes closed for equal or greater than 30 seconds to improve his ability to ambulate safely in low light situations.  Baseline: 5 seconds (12/23/2021);  Goal status: In-progress     PLAN: PT FREQUENCY: 1-2x/week   PT DURATION: 12 weeks   PLANNED INTERVENTIONS: Therapeutic exercises, Therapeutic activity, Neuromuscular re-education, Balance training, Gait training, Patient/Family education, Joint mobilization, DME instructions, Dry Needling, Spinal mobilization, Cryotherapy, Moist heat, and Manual therapy.   PLAN FOR NEXT SESSION: update HEP as appropriate, postural strengthening, LE/core/functional strengthening and balance activities as appropriate.  Everlean Alstrom. Graylon Good, PT, DPT 01/06/22, 8:00 PM  [facility] [address] [place], [place] [address] P: [phone] I F: [phone]

## 2022-01-11 ENCOUNTER — Ambulatory Visit: Payer: Medicare Other | Admitting: Physical Therapy

## 2022-01-12 ENCOUNTER — Encounter: Payer: Medicare Other | Admitting: Physical Therapy

## 2022-01-13 ENCOUNTER — Ambulatory Visit: Payer: Medicare Other | Admitting: Physical Therapy

## 2022-01-18 ENCOUNTER — Encounter: Payer: Medicare Other | Admitting: Physical Therapy

## 2022-01-18 DIAGNOSIS — L988 Other specified disorders of the skin and subcutaneous tissue: Secondary | ICD-10-CM | POA: Diagnosis not present

## 2022-01-18 DIAGNOSIS — C44319 Basal cell carcinoma of skin of other parts of face: Secondary | ICD-10-CM | POA: Diagnosis not present

## 2022-01-20 ENCOUNTER — Encounter: Payer: Medicare Other | Admitting: Physical Therapy

## 2022-01-25 ENCOUNTER — Encounter: Payer: Medicare Other | Admitting: Physical Therapy

## 2022-01-25 DIAGNOSIS — C44319 Basal cell carcinoma of skin of other parts of face: Secondary | ICD-10-CM | POA: Diagnosis not present

## 2022-01-27 ENCOUNTER — Encounter: Payer: Medicare Other | Admitting: Physical Therapy

## 2022-02-01 ENCOUNTER — Encounter: Payer: Medicare Other | Admitting: Physical Therapy

## 2022-02-03 ENCOUNTER — Encounter: Payer: Medicare Other | Admitting: Physical Therapy

## 2022-02-08 ENCOUNTER — Encounter: Payer: Medicare Other | Admitting: Physical Therapy

## 2022-02-09 ENCOUNTER — Other Ambulatory Visit: Payer: Self-pay | Admitting: Cardiovascular Disease

## 2022-02-10 ENCOUNTER — Encounter: Payer: Medicare Other | Admitting: Physical Therapy

## 2022-03-17 ENCOUNTER — Other Ambulatory Visit: Payer: Self-pay | Admitting: Cardiovascular Disease

## 2022-03-17 NOTE — Telephone Encounter (Signed)
Prescription refill request for Eliquis received. Indication: Atrial Flutter Last office visit: 08/02/21  Johnny Bridge MD Scr: 1.18 on 12/13/21 Age: 88 Weight: 80.5kg  Based on above findings Eliquis '5mg'$  twice daily is the appropriate dose.  Refill approved.

## 2022-04-14 ENCOUNTER — Telehealth: Payer: Self-pay | Admitting: Family Medicine

## 2022-04-14 ENCOUNTER — Telehealth: Payer: Self-pay

## 2022-04-14 NOTE — Telephone Encounter (Signed)
-----   Message from Rowland Lathe, [facility] sent at 04/14/2022 11:35 AM EDT ----- Regarding: Medication assistance Hi,   I received  a referral from Dr. Caryl Bis for patient's wife and while on the call, she asked that I help her husband. He is currently out of Praluent (last dose 3 weeks ago) and Eliquis (last dose was yesterday). I was wondering if either practices have samples for him to use while I work to try to set up medication assistance.   Thanks,  Kristeen Miss, PharmD

## 2022-04-14 NOTE — Telephone Encounter (Signed)
Samples placed up front for pick up (2 weeks supply of Eliquis '5mg'$  BID)  Left voicemail to inform patient & sent mychart message

## 2022-04-14 NOTE — Progress Notes (Signed)
[facility]) Care Management  [place]   04/14/2022  Rodney Delacruz 15-Apr-1934 [id number]  Reason for referral: Medication assistance  Referral source: Patient's spouse during her medication assistance referral that her PCP sent to [facility] Referral medication(s): Eliquis, Praluent Current insurance:Medicare Part D  (confirmed with Rodney Delacruz)  Patient's wife requested medication assistance for Rodney Delacruz. He reports Eliquis and Praluent are the most expensive. Praluent's grant expired April 2023 and he has been paying $136 for medication. He reports the last dose was 3 weeks ago and requested samples. Of note, [employer] and [employer] grants are closed for enrollment. As for Eliquis, his last dose was yesterday and he wanted a sample of medication due to cost. Encouraged patient to call clinics for samples.  Medication Assistance Findings:  Medication assistance needs identified: Eliquis and Praluent       Plan: I will route patient assistance letter to [place] technician who will coordinate patient assistance program application process for medications listed above.  [facility] pharmacy technician will assist with obtaining all required documents from both patient and provider(s) and submit application(s) once completed.    Messaged Rodney Delacruz and Rodney Delacruz for Praluent/Eliquis samples via inbasket  Thank you for allowing pharmacy to be a part of this patient's care. Kristeen Miss, PharmD Clinical Pharmacist [place] Cell: [phone]

## 2022-04-14 NOTE — Telephone Encounter (Signed)
Do we have samples of eliquis? If we do, can you have someone prepare 2 weeks worth for him and call him to let him know to pick it up?

## 2022-04-15 ENCOUNTER — Telehealth: Payer: Self-pay | Admitting: *Deleted

## 2022-04-15 NOTE — Telephone Encounter (Signed)
Spoke w/ pt.  He asks if we have any Praluent at any other office that he can have his wife pick up.  Advised him that I will send a message to [facility] to see if they have any available.

## 2022-04-15 NOTE — Telephone Encounter (Signed)
I have placed two boxes of Eliquis 5 mg tablet for pt at front desk for pick up.  Eliquis 5 mg tablet. 2 boxes Lot# [id number] Exp: 11/2023  Pt made aware.

## 2022-04-15 NOTE — Telephone Encounter (Signed)
Spoke w/ pt.   Advised him that none of the [facility] offices have any samples of Praluent and haven't gotten any in months.  He is appreciative for [place] checking. Asked him to call back if we can be of further assistance.

## 2022-04-19 ENCOUNTER — Telehealth: Payer: Self-pay | Admitting: Pharmacy Technician

## 2022-04-19 DIAGNOSIS — Z596 Low income: Secondary | ICD-10-CM

## 2022-04-19 NOTE — Progress Notes (Signed)
[facility])                                            [place] Team    04/19/2022  Rodney Delacruz [birth year] [id number]                                      Medication Assistance Referral  Referral From:  [facility] Rph [name]  Medication/Company: [name] / [employer] Patient application portion:  Mailed Provider application portion: Faxed  to Dr. Caryl Bis Provider address/fax verified via: Office website  Medication/Company: [name] / [employer] Patient application portion:  Mailed Provider application portion: Faxed  to Dr. Ida Rogue Provider address/fax verified via: Office website  .  [name] P. [name], [place]  [phone]

## 2022-04-21 ENCOUNTER — Other Ambulatory Visit: Payer: Self-pay | Admitting: *Deleted

## 2022-04-21 MED ORDER — APIXABAN 5 MG PO TABS: 5.0000 mg | ORAL_TABLET | Freq: Two times a day (BID) | ORAL | 3 refills | Status: DC

## 2022-05-04 ENCOUNTER — Telehealth: Payer: Self-pay | Admitting: Pharmacy Technician

## 2022-05-04 DIAGNOSIS — Z596 Low income: Secondary | ICD-10-CM

## 2022-05-04 NOTE — Progress Notes (Signed)
[facility])                                            [place] Team    05/04/2022  Rodney Delacruz 1933/09/01 [id number]  Received both patient and provider portion(s) of patient assistance application(s) for Praluent and Eliquis.  Faxed completed application and required documents into [employer] and Eliquis respectively.  Incoming call received from patient, HIPAA verified. Patient informs he received a call from Rodney Delacruz that the provider's page was missing. Spoke to Hy[place] who inorms they have all the necessary information in regard to Praluent application and that she would send it to processing today.  Creston Klas P. Aneta Hendershott, [place]  [phone]

## 2022-05-06 ENCOUNTER — Telehealth: Payer: Self-pay | Admitting: Pharmacy Technician

## 2022-05-06 DIAGNOSIS — Z596 Low income: Secondary | ICD-10-CM

## 2022-05-06 NOTE — Progress Notes (Signed)
[facility])                                            [place] Team    05/06/2022  Rodney Delacruz March 05, 1934 [id number]  Care coordination call placed to [employer] in regard to Eliquis application.  Spoke to Rodney Delacruz who informs patient is APPROVED 05/05/22-08/14/22. She informs medication will be delivered to the patient's home.  [name], [name]  [phone]

## 2022-05-11 ENCOUNTER — Telehealth: Payer: Self-pay | Admitting: Pharmacy Technician

## 2022-05-11 DIAGNOSIS — Z596 Low income: Secondary | ICD-10-CM

## 2022-05-11 NOTE — Progress Notes (Signed)
[facility])                                            [place] Team    05/11/2022  TARYN NAVE [birth year] [id number]  Care coordination call placed to [employer] in regard to Praluent application.  Spoke to [place] who informs patient is APPROVED 05/06/22-08/14/22. She informs patient's medication will be delivered to his home.  Kali Ambler P. Gwenda Heiner, [place]  [phone]

## 2022-05-12 DIAGNOSIS — Z23 Encounter for immunization: Secondary | ICD-10-CM | POA: Diagnosis not present

## 2022-06-13 ENCOUNTER — Encounter (INDEPENDENT_AMBULATORY_CARE_PROVIDER_SITE_OTHER): Payer: Self-pay

## 2022-06-15 ENCOUNTER — Encounter: Payer: Self-pay | Admitting: Family Medicine

## 2022-06-15 ENCOUNTER — Ambulatory Visit (INDEPENDENT_AMBULATORY_CARE_PROVIDER_SITE_OTHER): Payer: Medicare Other | Admitting: Family Medicine

## 2022-06-15 DIAGNOSIS — I483 Typical atrial flutter: Secondary | ICD-10-CM | POA: Diagnosis not present

## 2022-06-15 DIAGNOSIS — F32A Depression, unspecified: Secondary | ICD-10-CM | POA: Diagnosis not present

## 2022-06-15 DIAGNOSIS — I2510 Atherosclerotic heart disease of native coronary artery without angina pectoris: Secondary | ICD-10-CM | POA: Diagnosis not present

## 2022-06-15 DIAGNOSIS — R7303 Prediabetes: Secondary | ICD-10-CM

## 2022-06-15 DIAGNOSIS — E782 Mixed hyperlipidemia: Secondary | ICD-10-CM

## 2022-06-15 DIAGNOSIS — F419 Anxiety disorder, unspecified: Secondary | ICD-10-CM

## 2022-06-15 DIAGNOSIS — M791 Myalgia, unspecified site: Secondary | ICD-10-CM | POA: Diagnosis not present

## 2022-06-15 DIAGNOSIS — I10 Essential (primary) hypertension: Secondary | ICD-10-CM

## 2022-06-15 LAB — LIPID PANEL
Cholesterol: 107 mg/dL (ref 0–200)
HDL: 48.9 mg/dL (ref >39.00)
LDL Cholesterol: 48 mg/dL (ref 0–99)
NonHDL: 58.5
Total CHOL/HDL Ratio: 2
Triglycerides: 53 mg/dL (ref 0.0–149.0)
VLDL: 10.6 mg/dL (ref 0.0–40.0)

## 2022-06-15 LAB — COMPREHENSIVE METABOLIC PANEL
ALT: 8 U/L (ref 0–53)
AST: 13 U/L (ref 0–37)
Albumin: 3.8 g/dL (ref 3.5–5.2)
Alkaline Phosphatase: 67 U/L (ref 39–117)
BUN: 16 mg/dL (ref 6–23)
CO2: 28 mEq/L (ref 19–32)
Calcium: 8.3 mg/dL — ABNORMAL LOW (ref 8.4–10.5)
Chloride: 106 mEq/L (ref 96–112)
Creatinine, Ser: 1.12 mg/dL (ref 0.40–1.50)
GFR: 59.55 mL/min — ABNORMAL LOW (ref >60.00)
Glucose, Bld: 98 mg/dL (ref 70–99)
Potassium: 4.2 mEq/L (ref 3.5–5.1)
Sodium: 140 mEq/L (ref 135–145)
Total Bilirubin: 0.6 mg/dL (ref 0.2–1.2)
Total Protein: 6.2 g/dL (ref 6.0–8.3)

## 2022-06-15 LAB — TSH: TSH: 1.43 u[IU]/mL (ref 0.35–5.50)

## 2022-06-15 LAB — HEMOGLOBIN A1C: Hgb A1c MFr Bld: 5.9 % (ref 4.6–6.5)

## 2022-06-15 MED ORDER — SERTRALINE HCL 50 MG PO TABS: ORAL_TABLET | ORAL | 3 refills | Status: DC

## 2022-06-15 NOTE — Assessment & Plan Note (Addendum)
Patient previously had work-up for this issue with vascular surgery.  They did not identify any PAD.  Could be muscular related to his Praluent.  Discussed monitoring at this time.

## 2022-06-15 NOTE — Patient Instructions (Signed)
Nice to see you. We will get lab work today and contact you with the results. We will start you on Zoloft 25 mg daily for 14 days and then you will increase to 50 mg daily.  I will see you back in 6 weeks.

## 2022-06-15 NOTE — Assessment & Plan Note (Signed)
Check A1c. 

## 2022-06-15 NOTE — Assessment & Plan Note (Signed)
Blood pressure adequately controlled on recheck.  He will continue ramipril 10 mg daily, metoprolol 25 mg daily, Imdur 30 mg daily, and amlodipine 10 mg daily.

## 2022-06-15 NOTE — Assessment & Plan Note (Signed)
Continue Praluent 75 mg every 14 days.

## 2022-06-15 NOTE — Progress Notes (Signed)
Rodney Rumps, MD Phone: [phone]  Rodney Delacruz is a 88 y.o. male who presents today for f/u.  HYPERTENSION/atrial flutter Disease Monitoring Chest pain- no    Dyspnea- no Palpitations- no Fatigue- nothing excessive Medications Compliance-  taking amlodipine, eliquis, imdur, metoprolol, ramipril  Edema- no Patient notes his pulse over the last week has been down into the mid 50s.  He has not really noticed any other changes other than that. BMET    Component Value Date/Time   NA 140 12/13/2021 1028   NA 142 10/02/2012 0533   K 4.7 12/13/2021 1028   K 4.1 10/02/2012 0533   CL 107 12/13/2021 1028   CL 108 (H) 10/02/2012 0533   CO2 24 12/13/2021 1028   CO2 27 10/02/2012 0533   GLUCOSE 92 12/13/2021 1028   GLUCOSE 101 (H) 10/02/2012 0533   BUN 21 12/13/2021 1028   BUN 18 10/02/2012 0533   CREATININE 1.18 12/13/2021 1028   CREATININE 1.17 10/02/2012 0533   CALCIUM 8.5 12/13/2021 1028   CALCIUM 8.7 10/02/2012 0533   GFRNONAA >60 02/19/2021 0941   GFRNONAA >60 10/02/2012 0533   GFRAA >60 10/02/2012 0533   Hyperlipidemia: Patient on Praluent.  He notes no myalgias.  No right upper quadrant pain.  He does note some pain in his thighs with ambulation.  This occurs intermittently.  It improves with rest. It is not bad.   Depression/anxiety: Patient notes anxiety has increased.  The depression is very minimal.  He notes he gets restless and irritable.  No SI.  He was previously on bupropion.  Social History   Tobacco Use  Smoking Status Former   Packs/day: 1.00   Years: 20.00   Total pack years: 20.00   Types: Cigarettes   Quit date: 08/15/1976   Years since quitting: 45.8  Smokeless Tobacco Never    Current Outpatient Medications on File Prior to Visit  Medication Sig Dispense Refill   acetaminophen (TYLENOL) 500 MG tablet Take 1,000 mg by mouth every 6 (six) hours as needed for moderate pain or headache.     Alirocumab (PRALUENT) 75 MG/ML SOAJ Inject 75 mg into the  skin every 14 (fourteen) days. 6 mL 2   amLODipine (NORVASC) 10 MG tablet Take 1 tablet (10 mg total) by mouth every other day. 45 tablet 3   apixaban (ELIQUIS) 5 MG TABS tablet Take 1 tablet (5 mg total) by mouth 2 (two) times daily. 180 tablet 3   diphenhydrAMINE (BENADRYL) 25 MG tablet Take 25 mg by mouth daily as needed for allergies.     fluticasone (FLONASE) 50 MCG/ACT nasal spray Place 1 spray into both nostrils daily as needed for allergies. 48 mL 3   isosorbide mononitrate (IMDUR) 30 MG 24 hr tablet Take 1 tablet (30 mg total) by mouth daily. 90 tablet 3   metoprolol succinate (TOPROL-XL) 25 MG 24 hr tablet Take 1 tablet (25 mg total) by mouth daily. 90 tablet 3   Multiple Vitamin (MULTI-VITAMIN DAILY PO) Take 1 tablet by mouth daily.     nitroGLYCERIN (NITROSTAT) 0.4 MG SL tablet Place 1 tablet (0.4 mg total) under the tongue every 5 (five) minutes as needed for chest pain. 25 tablet 3   omeprazole (PRILOSEC) 20 MG capsule Take 20 mg by mouth every other day.     Polyethyl Glycol-Propyl Glycol (SYSTANE OP) Place 1 drop into both eyes daily.     ramipril (ALTACE) 10 MG capsule TAKE ONE CAPSULE BY MOUTH DAILY 90 capsule 1  No current facility-administered medications on file prior to visit.     ROS see history of present illness  Objective  Physical Exam Vitals:   06/15/22 1007 06/15/22 1026  BP: (!) 160/70 120/60  Pulse: (!) 54   Temp: 98 F (36.7 C)   SpO2: 98%     BP Readings from Last 3 Encounters:  06/15/22 120/60  12/13/21 130/70  08/02/21 (!) 140/52   Wt Readings from Last 3 Encounters:  06/15/22 174 lb 3.2 oz (79 kg)  12/13/21 176 lb 3.2 oz (79.9 kg)  08/02/21 177 lb 6 oz (80.5 kg)    Physical Exam Constitutional:      General: He is not in acute distress.    Appearance: He is not diaphoretic.  Cardiovascular:     Rate and Rhythm: Regular rhythm. Bradycardia present.     Heart sounds: Normal heart sounds.  Pulmonary:     Effort: Pulmonary effort is  normal.     Breath sounds: Normal breath sounds.  Skin:    General: Skin is warm and dry.  Neurological:     Mental Status: He is alert.      Assessment/Plan: Please see individual problem list.  Problem List Items Addressed This Visit     Atrial flutter (HCC) (Chronic)    Sinus rhythm.  Rate controlled.  He will remain on Eliquis 5 mg twice daily.  He will remain on metoprolol 25 mg daily.  Discussed as long as he is not having any excessive fatigue or other symptoms with the low heart rate we would not need to make any changes.  If his heart rate drops any further than the mid 50s or if he develops symptoms he will let us know.      Relevant Orders   TSH   Essential hypertension (Chronic)    Blood pressure adequately controlled on recheck.  He will continue ramipril 10 mg daily, metoprolol 25 mg daily, Imdur 30 mg daily, and amlodipine 10 mg daily.      Relevant Orders   TSH   Comp Met (CMET)   Mixed hyperlipidemia (Chronic)    Continue Praluent 75 mg every 14 days.      Relevant Orders   Comp Met (CMET)   Lipid panel   Anxiety and depression    We will start the patient on Zoloft 25 mg daily for 14 days and then he will increase to 50 mg daily.  I will see him back in 6 weeks.  Discussed the risk of sleep issues, sexual dysfunction, and weight gain with this medication.  He will contact us if he develops those or any other side effects.      Relevant Medications   sertraline (ZOLOFT) 50 MG tablet   Muscle ache    Patient previously had work-up for this issue with vascular surgery.  They did not identify any PAD.  Could be muscular related to his Praluent.  Discussed monitoring at this time.      Prediabetes    Check A1c.      Relevant Orders   HgB A1c     Return in about 6 weeks (around 07/27/2022) for Anxiety.   Rodney Rumps, MD [place]

## 2022-06-15 NOTE — Assessment & Plan Note (Signed)
Sinus rhythm.  Rate controlled.  He will remain on Eliquis 5 mg twice daily.  He will remain on metoprolol 25 mg daily.  Discussed as long as he is not having any excessive fatigue or other symptoms with the low heart rate we would not need to make any changes.  If his heart rate drops any further than the mid 50s or if he develops symptoms he will let us know.

## 2022-06-15 NOTE — Assessment & Plan Note (Addendum)
We will start the patient on Zoloft 25 mg daily for 14 days and then he will increase to 50 mg daily.  I will see him back in 6 weeks.  Discussed the risk of sleep issues, sexual dysfunction, and weight gain with this medication.  He will contact us if he develops those or any other side effects.

## 2022-06-30 DIAGNOSIS — H353131 Nonexudative age-related macular degeneration, bilateral, early dry stage: Secondary | ICD-10-CM | POA: Diagnosis not present

## 2022-07-12 ENCOUNTER — Telehealth: Payer: Self-pay

## 2022-07-12 NOTE — Progress Notes (Signed)
[facility]) Care Management  [place]   07/12/2022  Rodney Delacruz [birth year] [id number]   2024 Medication Assistance Renewal Application Summary:  Patient was outreached by [place] Team regarding medication assistance renewal for 2024. Verified address, anticipated insurance for 2024, and income has not changed. Patient remains interested in PAP for 2024 for Praluent, no other new medications were identified for medication assistance.    Medication Review Findings:  [employer] requires that Medicare applicants spend 3% of income on prescription medications. Therefore, unable to complete 2024 re-enrollment application for 0938.    Medication Assistance Findings:  Medication assistance needs identified: Praluent     Plan: I will route patient assistance letter to [place] technician who will coordinate patient assistance program application process for medications listed above.  [facility] pharmacy technician will assist with obtaining all required documents from both patient and provider(s) and submit application(s) once completed.    Thank you for allowing pharmacy to be a part of this patient's care.  Kristeen Miss, PharmD Clinical Pharmacist [place] Cell: [phone]

## 2022-07-14 ENCOUNTER — Other Ambulatory Visit: Payer: Self-pay | Admitting: Cardiovascular Disease

## 2022-07-14 ENCOUNTER — Telehealth: Payer: Self-pay

## 2022-07-14 ENCOUNTER — Other Ambulatory Visit: Payer: Self-pay | Admitting: Pharmacist

## 2022-07-14 DIAGNOSIS — I2510 Atherosclerotic heart disease of native coronary artery without angina pectoris: Secondary | ICD-10-CM

## 2022-07-14 MED ORDER — PRALUENT 75 MG/ML ~~LOC~~ SOAJ: 75.0000 mg | SUBCUTANEOUS | 3 refills | Status: DC

## 2022-07-14 NOTE — Progress Notes (Addendum)
Care Coordination Call  Patient assistance program for Praluent will not re-open in 2024. Can use [employer] to assist with copay.   Completed re-enrollment via provider portal. Patient approved for copay assistance for Praluent 06/14/22-06/14/23.   BNIN: [id number] PCN: [id number] Group: [id number] ID: [id number]  Refill sent to the pharmacy with new billing information. CPhT will notify patient.   Catie Hedwig Morton, PharmD, [place], [place] [facility] [phone]

## 2022-07-14 NOTE — Progress Notes (Signed)
[place] [facility]) Care Management  [place]   07/14/2022  Rodney Delacruz 1933-12-31 [id number]   2024 Medication Assistance Renewal Application Summary:  Patient was outreached by [place] Team regarding medication assistance renewal for 2024. Recently discussed medication assistance for Praluent a couple of days ago with patient. However, the manufacturer of Praluent announced they are no longer accepting applications from patients with Medicare. Catie Darnelle Maffucci, PharmD enrolled patient for grant (please review her note) today. Patient reports that he has one more shipment of Praluent from the manufacturer. Discussed filling Praluent in 2024, to pick up from [facility], and explained how the prescription should be billed. Rodney Delacruz verified understanding of the above changes.    Thank you for allowing pharmacy to be a part of this patient's care.  Kristeen Miss, PharmD Clinical Pharmacist [place] Cell: [phone]

## 2022-07-27 ENCOUNTER — Ambulatory Visit: Payer: Medicare Other | Admitting: Family Medicine

## 2022-08-04 ENCOUNTER — Ambulatory Visit: Payer: Medicare Other | Admitting: Family Medicine

## 2022-08-17 ENCOUNTER — Other Ambulatory Visit: Payer: Self-pay | Admitting: Cardiovascular Disease

## 2022-08-18 NOTE — Telephone Encounter (Signed)
Please contact patient for appt, last seen 07-2021 with Dr. Rockey Situ.  Refill pending appt.  Thank you

## 2022-08-21 NOTE — Progress Notes (Unsigned)
Date:  08/22/2022   ID:  Rodney Delacruz, DOB [birth year], MRN [id number]  Patient Location:  [place] [address]   Provider location:   [facility], [place] office  PCP:  [name], MD  Cardiologist:  Arvid Right [facility]  Chief Complaint  Patient presents with   12 month follow up     "Doing well." Medications reviewed by the patient verbally.     History of Present Illness:   Rodney Delacruz is a 89 yo male with  past medical history of coronary artery disease, prior PCI and bypass surgery in 2008 at [facility],  presenting to [facility] for chest pain on 10/30/2012.  ruled out for MI  Myoview study that showed no ischemia.  GERD.   prior smoking history, stopped in the 70s,  no diabetes He presents today for routine followup of his coronary artery disease, atrial flutter  LOV: 07/2021  In follow-up today reports feeling relatively well No regular exercise program Hobbies include reading/TV, shopping No arrhythmia that he can appreciate concerning for atrial fibrillation flutter  Blood pressure well-controlled  Lab work reviewed HA1C 5.9 Total chol 107, LDL 48  EKG personally reviewed by myself on todays visit Normal sinus rhythm rate 63 bpm no significant ST-T wave changes  Prior procedure details reviewed Cath 07/07/21 for angina 1.  Severe underlying three-vessel and left main coronary artery disease with occluded mid left main and severe diffuse disease in the native right coronary artery.  Patent grafts including LIMA to distal LAD, SVG to OM1 and SVG to right PDA.  The mid to distal LAD has a stent that is occluded.  The proximal LAD is supplied partially by retrograde flow from SVG to OM1.  2.  Left ventricular angiography was not performed.  EF was normal by echo.  Normal left ventricular end-diastolic pressure.   Recommendation for continued medical therapy.  The patient's graft are patent with no significant disease.     Echo 6/22 Echocardiogram Normal cardiac function right and left ventricle, no significant valve disease  History of statin intolerance  Denies any tachycardia concerning for recurrent atrial fib flutter 12/23/2020 in the office , new atrial flutter with controlled ventricular response.   started on Eliquis 5 mg twice daily Cardioversion 02/23/21, back in [facility]  Prior statin myalgias on Crestor, Lipitor After missing his Zetia in the past  Last cardiac catheterization 2009  Stress test February 2014  Past Medical History:  Diagnosis Date   Allergy    Anxiety    Coronary artery disease    Depression    Elevated PSA    Followed by Dr. Rosana Berger every 6 month   Heart murmur    Hyperlipidemia    Hypertension    MI (myocardial infarction) [facility]) 2009   Ulcer    Past Surgical History:  Procedure Laterality Date   CARDIAC CATHETERIZATION  2005, 2009   CARDIOVERSION N/A 02/23/2021   Procedure: CARDIOVERSION;  Surgeon: Minna Merritts, MD;  Location: [facility] ORS;  Service: Cardiovascular;  Laterality: N/A;   CHOLECYSTECTOMY  2001   CORONARY ANGIOPLASTY  2005   s/p stent placement @ [facility]   CORONARY ARTERY BYPASS GRAFT  2009   [facility]   LEFT HEART CATH AND CORS/GRAFTS ANGIOGRAPHY Left 07/07/2021   Procedure: LEFT HEART CATH AND CORS/GRAFTS ANGIOGRAPHY;  Surgeon: Wellington Hampshire, MD;  Location: [place] CV LAB;  Service: Cardiovascular;  Laterality: Left;   TONSILLECTOMY AND ADENOIDECTOMY  1942     Current Meds  Medication Sig   acetaminophen (TYLENOL) 500 MG tablet Take 1,000 mg by mouth every 6 (six) hours as needed for moderate pain or headache.   Alirocumab (PRALUENT) 75 MG/ML SOAJ Inject 75 mg into the skin every 14 (fourteen) days.   amLODipine (NORVASC) 10 MG tablet Take 1 tablet (10 mg total) by mouth every other day.   apixaban (ELIQUIS) 5 MG TABS tablet Take 1 tablet (5 mg total) by mouth 2 (two) times daily.   diphenhydrAMINE (BENADRYL) 25 MG tablet Take 25 mg by  mouth daily as needed for allergies.   fluticasone (FLONASE) 50 MCG/ACT nasal spray Place 1 spray into both nostrils daily as needed for allergies.   isosorbide mononitrate (IMDUR) 30 MG 24 hr tablet TAKE ONE TABLET BY MOUTH DAILY   metoprolol succinate (TOPROL-XL) 25 MG 24 hr tablet Take 1 tablet (25 mg total) by mouth daily.   Multiple Vitamin (MULTI-VITAMIN DAILY PO) Take 1 tablet by mouth daily.   nitroGLYCERIN (NITROSTAT) 0.4 MG SL tablet Place 1 tablet (0.4 mg total) under the tongue every 5 (five) minutes as needed for chest pain.   omeprazole (PRILOSEC) 20 MG capsule Take 20 mg by mouth every other day.   Polyethyl Glycol-Propyl Glycol (SYSTANE OP) Place 1 drop into both eyes daily.   ramipril (ALTACE) 10 MG capsule TAKE 1 CAPSULE BY MOUTH DAILY   sertraline (ZOLOFT) 50 MG tablet Take 0.5 tablets (25 mg total) by mouth daily for 14 days, THEN 1 tablet (50 mg total) daily.     Allergies:   Plavix [clopidogrel bisulfate]   Social History   Tobacco Use   Smoking status: Former    Packs/day: 1.00    Years: 20.00    Total pack years: 20.00    Types: Cigarettes    Quit date: 08/15/1976    Years since quitting: 46.0   Smokeless tobacco: Never  Vaping Use   Vaping Use: Never used  Substance Use Topics   Alcohol use: Yes    Alcohol/week: 0.0 standard drinks of alcohol    Comment: Occasional   Drug use: No     Family Hx: The patient's family history includes Alcohol abuse in his father and paternal grandfather; Diabetes in his sister; Heart attack in his brother; Heart disease in his brother; Stroke in his mother.  ROS:   Please see the history of present illness.    Review of Systems  Constitutional: Negative.   HENT: Negative.    Respiratory: Negative.    Cardiovascular: Negative.   Gastrointestinal: Negative.   Musculoskeletal: Negative.   Neurological: Negative.   Psychiatric/Behavioral: Negative.    All other systems reviewed and are negative.    Labs/Other Tests  and Data Reviewed:    Recent Labs: 06/15/2022: ALT 8; BUN 16; Creatinine, Ser 1.12; Potassium 4.2; Sodium 140; TSH 1.43   Recent Lipid Panel Lab Results  Component Value Date/Time   CHOL 107 06/15/2022 10:38 AM   CHOL 148 10/01/2012 01:05 PM   TRIG 53.0 06/15/2022 10:38 AM   TRIG 89 10/01/2012 01:05 PM   HDL 48.90 06/15/2022 10:38 AM   HDL 49 10/01/2012 01:05 PM   CHOLHDL 2 06/15/2022 10:38 AM   LDLCALC 48 06/15/2022 10:38 AM   LDLCALC 81 10/01/2012 01:05 PM   LDLDIRECT 124.0 04/23/2019 08:28 AM    Wt Readings from Last 3 Encounters:  08/22/22 172 lb 8 oz (78.2 kg)  06/15/22 174 lb 3.2 oz (79 kg)  12/13/21  176 lb 3.2 oz (79.9 kg)     Exam:    BP (!) 138/58 (BP Location: Left Arm, Patient Position: Sitting, Cuff Size: Normal)   Pulse 63   Ht 6' (1.829 m)   Wt 172 lb 8 oz (78.2 kg)   SpO2 94%   BMI 23.40 kg/m  Constitutional:  oriented to person, place, and time. No distress.  HENT:  Head: Grossly normal Eyes:  no discharge. No scleral icterus.  Neck: No JVD, no carotid bruits  Cardiovascular: Regular rate and rhythm, no murmurs appreciated Pulmonary/Chest: Clear to auscultation bilaterally, no wheezes or rails Abdominal: Soft.  no distension.  no tenderness.  Musculoskeletal: Normal range of motion Neurological:  normal muscle tone. Coordination normal. No atrophy Skin: Skin warm and dry Psychiatric: normal affect, pleasant  ASSESSMENT & PLAN:    Atrial flutter Maintaining normal sinus rhythm Prior cardioversion,  On anticoagulation, eliquis 5 BID  Atherosclerosis of native coronary artery of native heart with stable angina pectoris (HCC) Currently with no symptoms of angina. No further workup at this time. Continue current medication regimen. Continue Eliquis in place of aspirin, isosorbide, aggressive lipid management  Essential hypertension Adding isosorbide as above  S/P CABG (coronary artery bypass graft) Stable grafts  Hyperlipidemia Cholesterol  is at goal on the current lipid regimen. No changes to the medications were made.   Total encounter time more than 30 minutes  Greater than 50% was spent in counseling and coordination of care with the patient    Signed, Ida Rogue, MD  08/22/2022 11:59 AM    [place] [facility] [id number], [place], [place] [address]

## 2022-08-22 ENCOUNTER — Encounter: Payer: Self-pay | Admitting: Cardiovascular Disease

## 2022-08-22 ENCOUNTER — Ambulatory Visit: Payer: Medicare Other | Attending: Cardiovascular Disease | Admitting: Cardiovascular Disease

## 2022-08-22 VITALS — BP 138/58 | HR 63 | Ht 72.0 in | Wt 172.5 lb

## 2022-08-22 DIAGNOSIS — T466X5D Adverse effect of antihyperlipidemic and antiarteriosclerotic drugs, subsequent encounter: Secondary | ICD-10-CM

## 2022-08-22 DIAGNOSIS — D649 Anemia, unspecified: Secondary | ICD-10-CM | POA: Diagnosis not present

## 2022-08-22 DIAGNOSIS — Z951 Presence of aortocoronary bypass graft: Secondary | ICD-10-CM | POA: Diagnosis not present

## 2022-08-22 DIAGNOSIS — I483 Typical atrial flutter: Secondary | ICD-10-CM

## 2022-08-22 DIAGNOSIS — E785 Hyperlipidemia, unspecified: Secondary | ICD-10-CM | POA: Diagnosis not present

## 2022-08-22 DIAGNOSIS — I25118 Atherosclerotic heart disease of native coronary artery with other forms of angina pectoris: Secondary | ICD-10-CM | POA: Diagnosis not present

## 2022-08-22 DIAGNOSIS — M791 Myalgia, unspecified site: Secondary | ICD-10-CM | POA: Diagnosis not present

## 2022-08-22 DIAGNOSIS — I10 Essential (primary) hypertension: Secondary | ICD-10-CM | POA: Diagnosis not present

## 2022-08-22 DIAGNOSIS — T466X5A Adverse effect of antihyperlipidemic and antiarteriosclerotic drugs, initial encounter: Secondary | ICD-10-CM | POA: Diagnosis not present

## 2022-08-22 DIAGNOSIS — M7989 Other specified soft tissue disorders: Secondary | ICD-10-CM | POA: Diagnosis not present

## 2022-08-22 MED ORDER — NITROGLYCERIN 0.4 MG SL SUBL: 0.4000 mg | SUBLINGUAL_TABLET | SUBLINGUAL | 1 refills | Status: DC | PRN

## 2022-08-22 MED ORDER — ISOSORBIDE MONONITRATE ER 30 MG PO TB24: 30.0000 mg | ORAL_TABLET | Freq: Every day | ORAL | 2 refills | Status: DC

## 2022-08-22 MED ORDER — METOPROLOL SUCCINATE ER 25 MG PO TB24: 25.0000 mg | ORAL_TABLET | Freq: Every day | ORAL | 2 refills | Status: DC

## 2022-08-22 NOTE — Patient Instructions (Signed)
Medication Instructions:  No changes  If you need a refill on your cardiac medications before your next appointment, please call your pharmacy.   Lab work: No new labs needed  Testing/Procedures: No new testing needed  Follow-Up: At [facility], you and your health needs are our priority.  As part of our continuing mission to provide you with exceptional heart care, we have created designated Provider Care Teams.  These Care Teams include your primary Cardiologist (physician) and Advanced Practice Providers (APPs -  Physician Assistants and Nurse Practitioners) who all work together to provide you with the care you need, when you need it.  You will need a follow up appointment in 12 months  Providers on your designated Care Team:   Christopher Berge, NP Ryan Dunn, PA-C Cadence Furth, PA-C  COVID-19 Vaccine Information can be found at: [email or web address] For questions related to vaccine distribution or appointments, please email [email or web address] or call [phone].   

## 2022-08-24 ENCOUNTER — Other Ambulatory Visit: Payer: Medicare Other | Admitting: Pharmacist

## 2022-08-24 ENCOUNTER — Encounter: Payer: Self-pay | Admitting: Cardiovascular Disease

## 2022-08-24 NOTE — Progress Notes (Signed)
Care Coordination Call  Received voicemail from patient that Kristopher Oppenheim was unable to utilize the [employer]. Attempted to call pharmacy, they were on their lunch break.   Received another call from patient that the issue had been taken care of. Per [employer] Portal, there is a paid claim for Praluent.     Catie Hedwig Morton, PharmD, [place], [facility] [phone]

## 2022-08-31 ENCOUNTER — Encounter: Payer: Self-pay | Admitting: Family Medicine

## 2022-08-31 ENCOUNTER — Ambulatory Visit (INDEPENDENT_AMBULATORY_CARE_PROVIDER_SITE_OTHER): Payer: Medicare Other | Admitting: Family Medicine

## 2022-08-31 VITALS — BP 130/70 | HR 63 | Temp 98.0°F | Ht 72.0 in | Wt 171.6 lb

## 2022-08-31 DIAGNOSIS — F419 Anxiety disorder, unspecified: Secondary | ICD-10-CM | POA: Diagnosis not present

## 2022-08-31 DIAGNOSIS — M109 Gout, unspecified: Secondary | ICD-10-CM

## 2022-08-31 DIAGNOSIS — F32A Depression, unspecified: Secondary | ICD-10-CM

## 2022-08-31 DIAGNOSIS — I2510 Atherosclerotic heart disease of native coronary artery without angina pectoris: Secondary | ICD-10-CM | POA: Diagnosis not present

## 2022-08-31 MED ORDER — PRALUENT 75 MG/ML ~~LOC~~ SOAJ: 75.0000 mg | SUBCUTANEOUS | 3 refills | Status: DC

## 2022-08-31 NOTE — Progress Notes (Signed)
Tommi Rumps, MD Phone: [phone]  Rodney Delacruz is a 89 y.o. male who presents today for f/u.  Anxiety: patient is currently taking zoloft '50mg'$ . He reports that his anxiety is much better. He denies any side effects and denies SI.   Painful left big toe: patient reports that since Sunday he has experienced pain and swelling in his left big toe. He says the pain is exacerbated by walking. He was told before more than ten years ago that he had gout but is not taking any gout medicine currently. Over-the counter Advil seems to improve the pain some. He wonders if he should see a podiatrist.   Social History   Tobacco Use  Smoking Status Former   Packs/day: 1.00   Years: 20.00   Total pack years: 20.00   Types: Cigarettes   Quit date: 08/15/1976   Years since quitting: 46.0  Smokeless Tobacco Never    Current Outpatient Medications on File Prior to Visit  Medication Sig Dispense Refill   acetaminophen (TYLENOL) 500 MG tablet Take 1,000 mg by mouth every 6 (six) hours as needed for moderate pain or headache.     amLODipine (NORVASC) 10 MG tablet Take 1 tablet (10 mg total) by mouth every other day. 45 tablet 3   apixaban (ELIQUIS) 5 MG TABS tablet Take 1 tablet (5 mg total) by mouth 2 (two) times daily. 180 tablet 3   diphenhydrAMINE (BENADRYL) 25 MG tablet Take 25 mg by mouth daily as needed for allergies.     fluticasone (FLONASE) 50 MCG/ACT nasal spray Place 1 spray into both nostrils daily as needed for allergies. 48 mL 3   isosorbide mononitrate (IMDUR) 30 MG 24 hr tablet Take 1 tablet (30 mg total) by mouth daily. 90 tablet 2   metoprolol succinate (TOPROL-XL) 25 MG 24 hr tablet Take 1 tablet (25 mg total) by mouth daily. 90 tablet 2   Multiple Vitamin (MULTI-VITAMIN DAILY PO) Take 1 tablet by mouth daily.     nitroGLYCERIN (NITROSTAT) 0.4 MG SL tablet Place 1 tablet (0.4 mg total) under the tongue every 5 (five) minutes as needed for chest pain. 25 tablet 1   Polyethyl  Glycol-Propyl Glycol (SYSTANE OP) Place 1 drop into both eyes daily.     ramipril (ALTACE) 10 MG capsule TAKE 1 CAPSULE BY MOUTH DAILY 30 capsule 0   sertraline (ZOLOFT) 50 MG tablet Take 0.5 tablets (25 mg total) by mouth daily for 14 days, THEN 1 tablet (50 mg total) daily. 30 tablet 3   No current facility-administered medications on file prior to visit.     ROS see history of present illness  Objective  Physical Exam Vitals:   08/31/22 1536  BP: 130/70  Pulse: 63  Temp: 98 F (36.7 C)  SpO2: 98%    BP Readings from Last 3 Encounters:  08/31/22 130/70  08/22/22 (!) 138/58  06/15/22 120/60   Wt Readings from Last 3 Encounters:  08/31/22 171 lb 9.6 oz (77.8 kg)  08/22/22 172 lb 8 oz (78.2 kg)  06/15/22 174 lb 3.2 oz (79 kg)    Physical Exam Constitutional:      Appearance: Normal appearance.  HENT:     Head: Normocephalic and atraumatic.  Cardiovascular:     Rate and Rhythm: Normal rate and regular rhythm.  Pulmonary:     Effort: Pulmonary effort is normal.     Breath sounds: Normal breath sounds.  Musculoskeletal:     Comments: Left foot: first MTP joint is  swollen and slightly erythematous, big toe is mildly swollen. Tender to palpation from left big toe down to the first metatarsophylangeal joint. Tenderness is more significant at the first MTP joint. No open lesions or wounds under the foot or in between toes.  Hammertoes are present in every toe bilaterally except his big toes.  Skin:    General: Skin is warm and dry.  Neurological:     Mental Status: He is alert.  Psychiatric:        Mood and Affect: Mood normal.      Assessment/Plan: Please see individual problem list.  Problem List Items Addressed This Visit       Cardiovascular and Mediastinum   CAD (coronary artery disease) (Chronic)   Relevant Medications   Alirocumab (PRALUENT) 75 MG/ML SOAJ     Musculoskeletal and Integument   Podagra - Primary    Could be arthritis, an early gout  flare, or cellulitis. We will get a uric acid to test for gout and CBC to check white count. Recommended to patient that if labwork is inconclusive to see a podiatrist for further recommendations. Also advised patient to get a supportive shoe insert or more supportive footwear in the meantime.      Relevant Orders   Uric acid   CBC w/Diff     Other   Anxiety and depression    Much improved. Continue zoloft '50mg'$  daily.        Return in about 6 months (around 03/01/2023) for Hypertension/anxiety.   Marisa Cyphers, Medical Student [place]

## 2022-08-31 NOTE — Patient Instructions (Addendum)
Nice to see you. We are going to get some lab work today and see if your foot symptoms may be related to gout.  We will contact you with the result.

## 2022-08-31 NOTE — Assessment & Plan Note (Signed)
Could be arthritis, an early gout flare, or cellulitis. We will get a uric acid to test for gout and CBC to check white count. Recommended to patient that if labwork is inconclusive to see a podiatrist for further recommendations. Also advised patient to get a supportive shoe insert or more supportive footwear in the meantime.

## 2022-08-31 NOTE — Assessment & Plan Note (Signed)
Much improved. Continue zoloft '50mg'$  daily.

## 2022-09-01 LAB — CBC WITH DIFFERENTIAL/PLATELET
Basophils Absolute: 0.1 10*3/uL (ref 0.0–0.1)
Basophils Relative: 1.2 % (ref 0.0–3.0)
Eosinophils Absolute: 0.4 10*3/uL (ref 0.0–0.7)
Eosinophils Relative: 5 % (ref 0.0–5.0)
HCT: 34 % — ABNORMAL LOW (ref 39.0–52.0)
Hemoglobin: 11.5 g/dL — ABNORMAL LOW (ref 13.0–17.0)
Lymphocytes Relative: 18.1 % (ref 12.0–46.0)
Lymphs Abs: 1.4 10*3/uL (ref 0.7–4.0)
MCHC: 33.8 g/dL (ref 30.0–36.0)
MCV: 99.1 fl (ref 78.0–100.0)
Monocytes Absolute: 0.7 10*3/uL (ref 0.1–1.0)
Monocytes Relative: 8.7 % (ref 3.0–12.0)
Neutro Abs: 5.2 10*3/uL (ref 1.4–7.7)
Neutrophils Relative %: 67 % (ref 43.0–77.0)
Platelets: 276 10*3/uL (ref 150.0–400.0)
RBC: 3.43 Mil/uL — ABNORMAL LOW (ref 4.22–5.81)
RDW: 13.9 % (ref 11.5–15.5)
WBC: 7.8 10*3/uL (ref 4.0–10.5)

## 2022-09-01 LAB — URIC ACID: Uric Acid, Serum: 5.9 mg/dL (ref 4.0–7.8)

## 2022-09-01 NOTE — Progress Notes (Signed)
Patient seen along with medical student Zada Zhong.  I personally evaluated this patient along with the student, and verified all aspects of the history, physical exam, and medical decision making as documented by the student.  I agree with the student's documentation and have made all necessary edits.  Vane Yapp, MD  

## 2022-09-02 NOTE — Addendum Note (Signed)
Addended by: Jeralyn Bennett A on: 09/02/2022 01:40 PM   Modules accepted: Orders

## 2022-09-05 ENCOUNTER — Other Ambulatory Visit: Payer: Self-pay | Admitting: Family Medicine

## 2022-09-05 DIAGNOSIS — M79672 Pain in left foot: Secondary | ICD-10-CM

## 2022-09-12 DIAGNOSIS — D2230 Melanocytic nevi of unspecified part of face: Secondary | ICD-10-CM | POA: Diagnosis not present

## 2022-09-12 DIAGNOSIS — D225 Melanocytic nevi of trunk: Secondary | ICD-10-CM | POA: Diagnosis not present

## 2022-09-12 DIAGNOSIS — L570 Actinic keratosis: Secondary | ICD-10-CM | POA: Diagnosis not present

## 2022-09-12 DIAGNOSIS — L820 Inflamed seborrheic keratosis: Secondary | ICD-10-CM | POA: Diagnosis not present

## 2022-09-12 DIAGNOSIS — D224 Melanocytic nevi of scalp and neck: Secondary | ICD-10-CM | POA: Diagnosis not present

## 2022-09-22 ENCOUNTER — Other Ambulatory Visit: Payer: Self-pay | Admitting: Cardiovascular Disease

## 2022-10-05 ENCOUNTER — Ambulatory Visit (INDEPENDENT_AMBULATORY_CARE_PROVIDER_SITE_OTHER): Payer: Medicare Other

## 2022-10-05 ENCOUNTER — Ambulatory Visit (INDEPENDENT_AMBULATORY_CARE_PROVIDER_SITE_OTHER): Payer: Medicare Other | Admitting: Podiatry

## 2022-10-05 ENCOUNTER — Encounter: Payer: Self-pay | Admitting: Podiatry

## 2022-10-05 DIAGNOSIS — M778 Other enthesopathies, not elsewhere classified: Secondary | ICD-10-CM

## 2022-10-05 DIAGNOSIS — I2510 Atherosclerotic heart disease of native coronary artery without angina pectoris: Secondary | ICD-10-CM

## 2022-10-05 DIAGNOSIS — G5792 Unspecified mononeuropathy of left lower limb: Secondary | ICD-10-CM

## 2022-10-05 NOTE — Progress Notes (Signed)
Subjective:  Patient ID: Rodney Delacruz, male    DOB: [birth year],  MRN: [id number] HPI Chief Complaint  Patient presents with   Foot Pain    1st MPJ/toe left - aching, PCP thought had gout, bloodwork negative, also gets numbness dorsal foot bilateral    New Patient (Initial Visit)    Est pt 2021    89 y.o. male presents with the above complaint.   ROS: Denies fever chills nausea vomit muscle aches pains calf pain back pain chest pain shortness of breath.  States that he has been using Voltaren gel which seems to work to some degree.  Past Medical History:  Diagnosis Date   Allergy    Anxiety    Coronary artery disease    Depression    Elevated PSA    Followed by Dr. Rosana Berger every 6 month   Heart murmur    Hyperlipidemia    Hypertension    MI (myocardial infarction) [facility]) 2009   Ulcer    Past Surgical History:  Procedure Laterality Date   CARDIAC CATHETERIZATION  2005, 2009   CARDIOVERSION N/A 02/23/2021   Procedure: CARDIOVERSION;  Surgeon: Minna Merritts, MD;  Location: [facility] ORS;  Service: Cardiovascular;  Laterality: N/A;   CHOLECYSTECTOMY  2001   CORONARY ANGIOPLASTY  2005   s/p stent placement @ [facility]   CORONARY ARTERY BYPASS GRAFT  2009   [facility]   LEFT HEART CATH AND CORS/GRAFTS ANGIOGRAPHY Left 07/07/2021   Procedure: LEFT HEART CATH AND CORS/GRAFTS ANGIOGRAPHY;  Surgeon: Wellington Hampshire, MD;  Location: [facility] [facility];  Service: Cardiovascular;  Laterality: Left;   TONSILLECTOMY AND ADENOIDECTOMY  1942    Current Outpatient Medications:    acetaminophen (TYLENOL) 500 MG tablet, Take 1,000 mg by mouth every 6 (six) hours as needed for moderate pain or headache., Disp: , Rfl:    Alirocumab (PRALUENT) 75 MG/ML SOAJ, Inject 75 mg into the skin every 14 (fourteen) days., Disp: 6 mL, Rfl: 3   amLODipine (NORVASC) 10 MG tablet, Take 1 tablet (10 mg total) by mouth every other day., Disp: 45 tablet, Rfl: 3   apixaban (ELIQUIS) 5 MG TABS tablet, Take 1 tablet (5  mg total) by mouth 2 (two) times daily., Disp: 180 tablet, Rfl: 3   diphenhydrAMINE (BENADRYL) 25 MG tablet, Take 25 mg by mouth daily as needed for allergies., Disp: , Rfl:    fluticasone (FLONASE) 50 MCG/ACT nasal spray, Place 1 spray into both nostrils daily as needed for allergies., Disp: 48 mL, Rfl: 3   isosorbide mononitrate (IMDUR) 30 MG 24 hr tablet, Take 1 tablet (30 mg total) by mouth daily., Disp: 90 tablet, Rfl: 2   metoprolol succinate (TOPROL-XL) 25 MG 24 hr tablet, Take 1 tablet (25 mg total) by mouth daily., Disp: 90 tablet, Rfl: 2   Multiple Vitamin (MULTI-VITAMIN DAILY PO), Take 1 tablet by mouth daily., Disp: , Rfl:    nitroGLYCERIN (NITROSTAT) 0.4 MG SL tablet, Place 1 tablet (0.4 mg total) under the tongue every 5 (five) minutes as needed for chest pain., Disp: 25 tablet, Rfl: 1   Polyethyl Glycol-Propyl Glycol (SYSTANE OP), Place 1 drop into both eyes daily., Disp: , Rfl:    ramipril (ALTACE) 10 MG capsule, TAKE 1 CAPSULE BY MOUTH DAILY, Disp: 30 capsule, Rfl: 10   sertraline (ZOLOFT) 50 MG tablet, Take 0.5 tablets (25 mg total) by mouth daily for 14 days, THEN 1 tablet (50 mg total) daily., Disp: 30 tablet, Rfl: 3  Allergies  Allergen Reactions   Plavix [Clopidogrel Bisulfate] Rash   Review of Systems Objective:  There were no vitals filed for this visit.  General: Well developed, nourished, in no acute distress, alert and oriented x3   Dermatological: Skin is warm, dry and supple bilateral. Nails x 10 are well maintained; remaining integument appears unremarkable at this time. There are no open sores, no preulcerative lesions, no rash or signs of infection present.  Vascular: Dorsalis Pedis artery and Posterior Tibial artery pedal pulses are 2/4 bilateral with immedate capillary fill time. Pedal hair growth present. No varicosities and no lower extremity edema present bilateral.   Neruologic: Grossly intact via light touch bilateral. Vibratory intact via tuning fork  bilateral. Protective threshold with Semmes Wienstein monofilament intact to all pedal sites bilateral. Patellar and Achilles deep tendon reflexes 2+ bilateral. No Babinski or clonus noted bilateral.   Musculoskeletal: No gross boney pedal deformities bilateral. No pain, crepitus, or limitation noted with foot and ankle range of motion bilateral. Muscular strength 5/5 in all groups tested bilateral.  Tenderness on palpation dorsal aspect of the bilateral foot cutaneously primarily.  He does have pain on palpation of the fibular sesamoidal suspensory ligament and capsulitis of that lateral joint.  Gait: Unassisted, Nonantalgic.    Radiographs:  Radiographs of the bilateral foot today demonstrate relatively normal osseous architecture but no severe osteoarthritic changes.  He does demonstrate calcification of the arteries to the foot including the perforators and distally.  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  Unknown etiology.  Capsulitis of the first metatarsophalangeal joint lateral collateral ligament area or fibular sesamoidal suspensory ligament.    Plan: Discussed with him in great detail the use of steroids and medication as opposed to him taking medication or using topical since he is on Eliquis.  He declined a dexamethasone injection.  He states that he would rather continue to Voltaren cream.     Laramie Gelles T. [place], [place]

## 2022-10-06 ENCOUNTER — Other Ambulatory Visit: Payer: Self-pay | Admitting: Family Medicine

## 2022-11-06 ENCOUNTER — Other Ambulatory Visit: Payer: Self-pay | Admitting: Family Medicine

## 2022-11-06 DIAGNOSIS — F419 Anxiety disorder, unspecified: Secondary | ICD-10-CM

## 2022-11-22 IMAGING — DX DG THORACIC SPINE 2V
3 series · 3 of 3 positions shown · non-contrast
Comparison: None.

CLINICAL DATA: Back pain with paresthesias in left dorsal foot.

EXAM:
THORACIC SPINE 2 VIEWS

[thoracic spine ap]
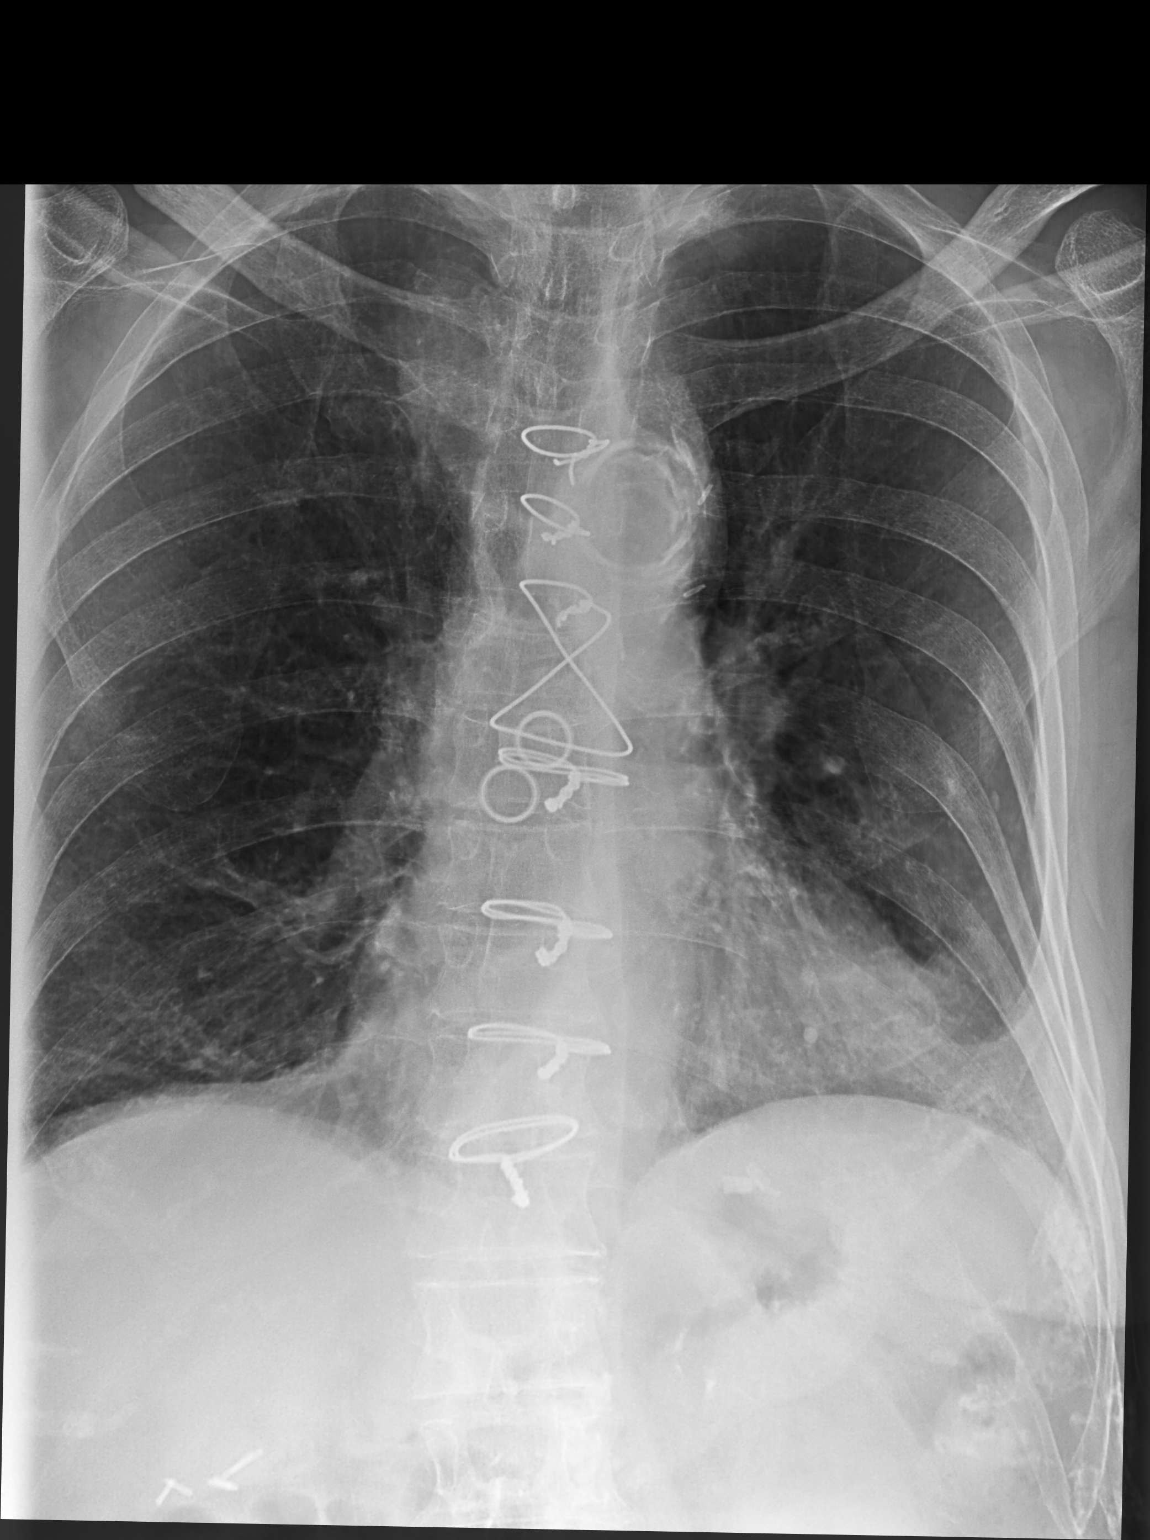

[thoracic spine lat]
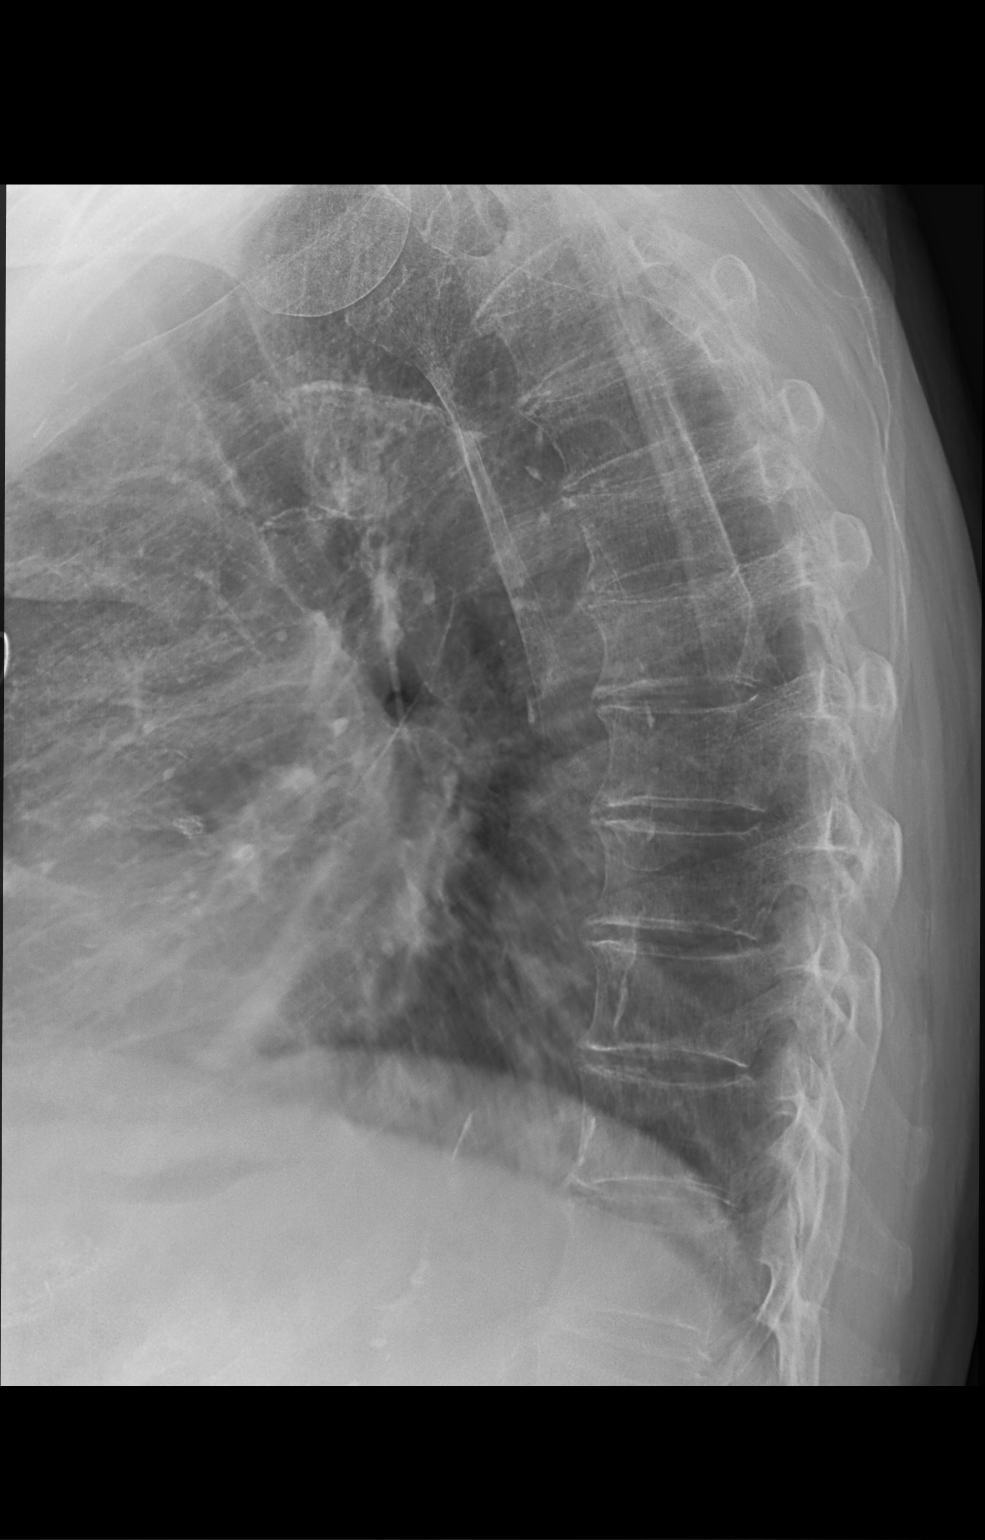

[swimmers lat]
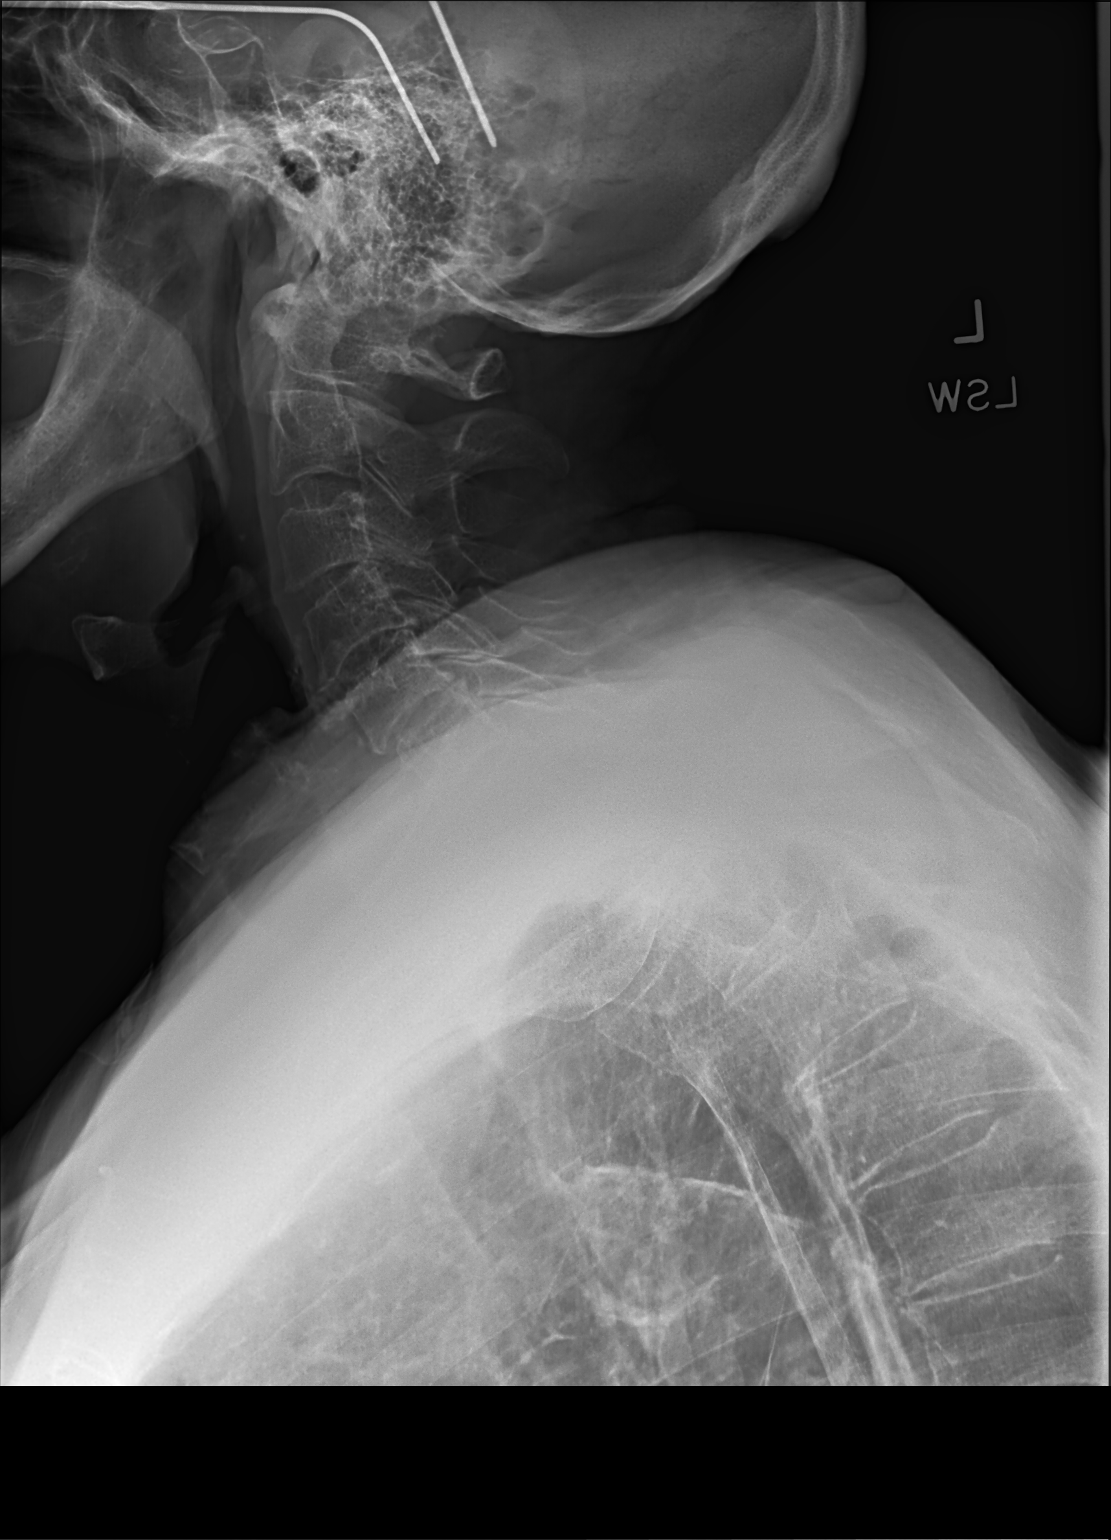

[3 of 3 positions shown; findings below may reference images not displayed]

FINDINGS: The bones are subjectively under mineralized. Slight exaggerated
upper thoracic kyphosis. Vertebral body heights are normal. Minor
spurring with relative preservation of disc spaces. No evidence of
bony destruction or focal bone lesion. No paravertebral soft tissue
abnormalities. Prior median sternotomy.
IMPRESSION: Minor degenerative change in the thoracic spine. Slight exaggerated
upper thoracic kyphosis. No acute or suspicious findings.

## 2022-11-22 IMAGING — DX DG LUMBAR SPINE COMPLETE 4+V
5 series · 5 of 5 positions shown · non-contrast
Comparison: None.

CLINICAL DATA: Chronic midline back pain. Back pain with
paresthesias in left dorsal foot.

EXAM:
LUMBAR SPINE - COMPLETE 4+ VIEW

[lumbar spine ap]
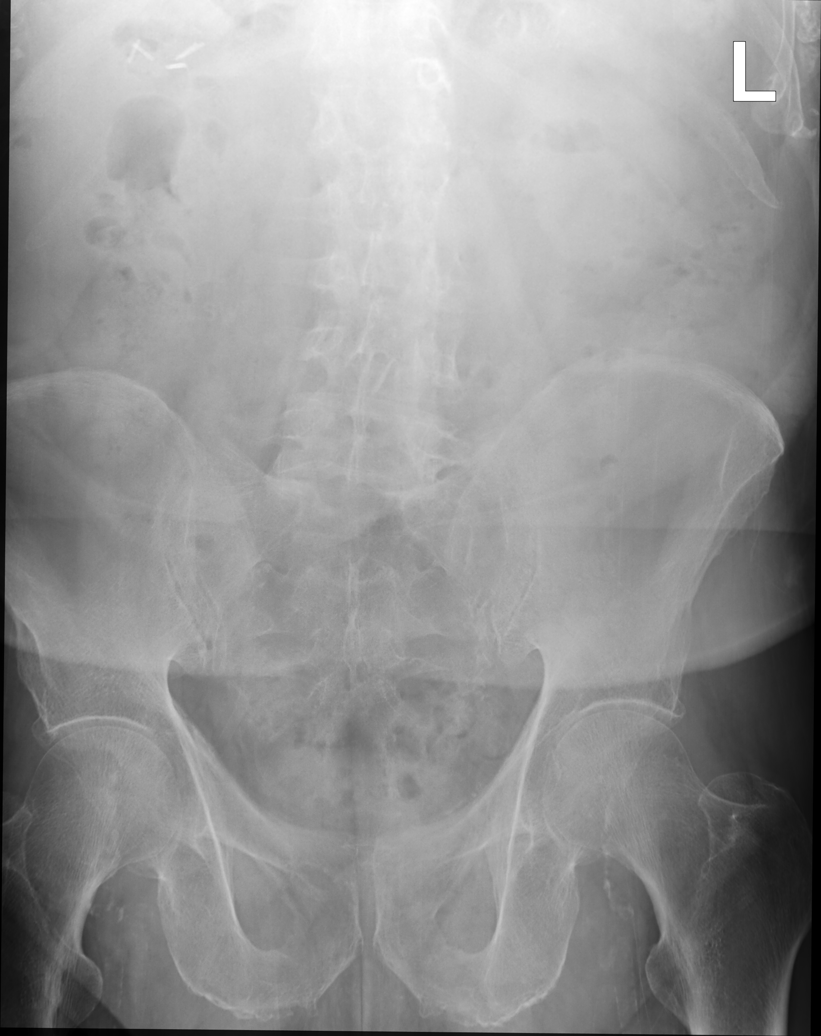

[lumbar spine obl (oblique) (1 of 2)]
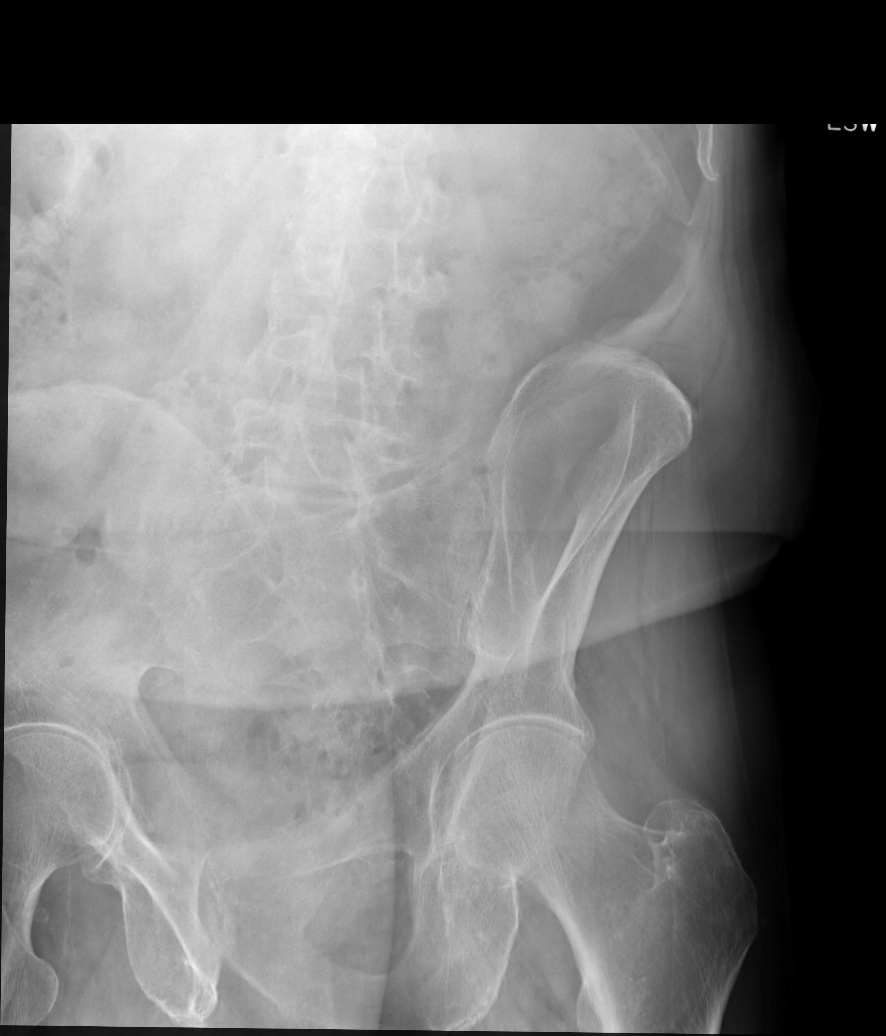

[lumbar spine obl (oblique) (2 of 2)]
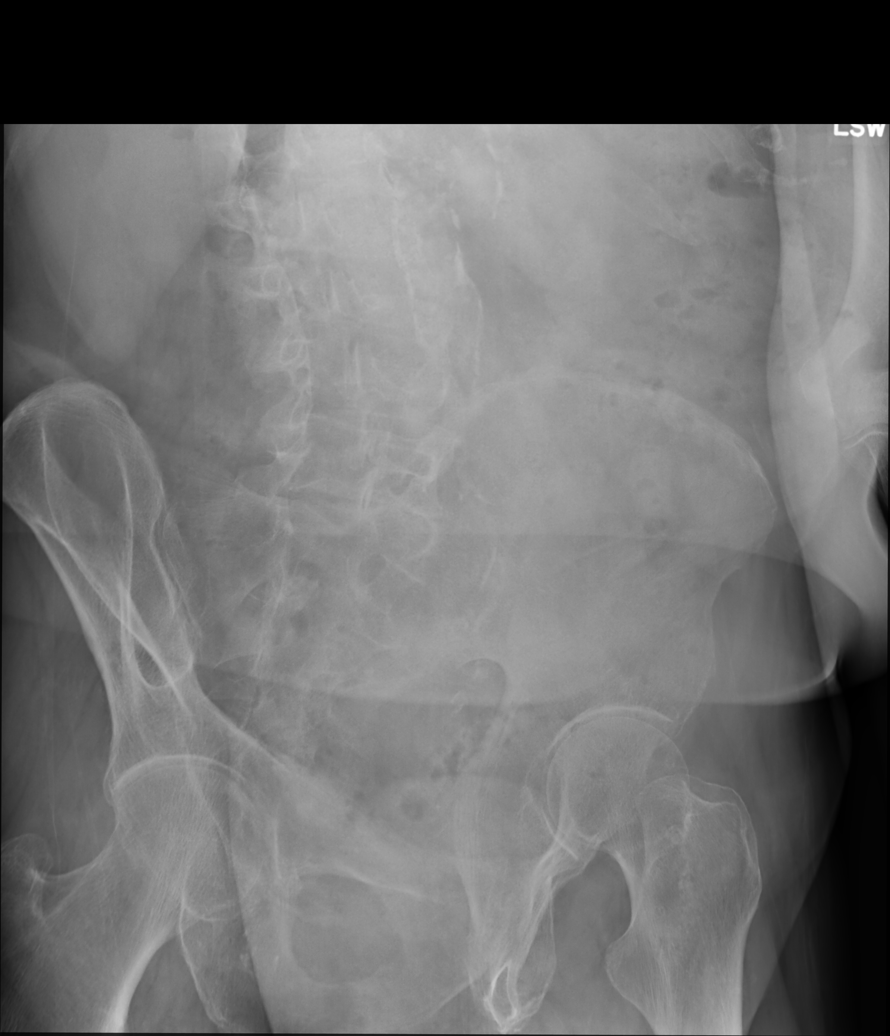

[lumbar spine lat]
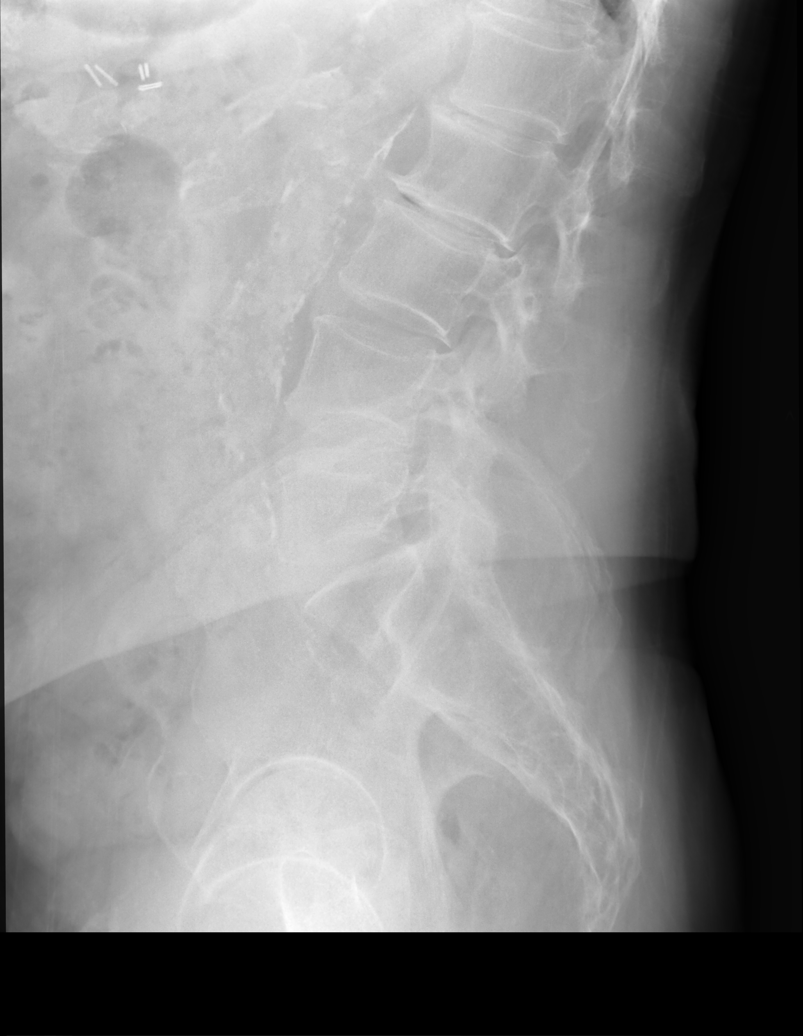

[lumbar spot lat]
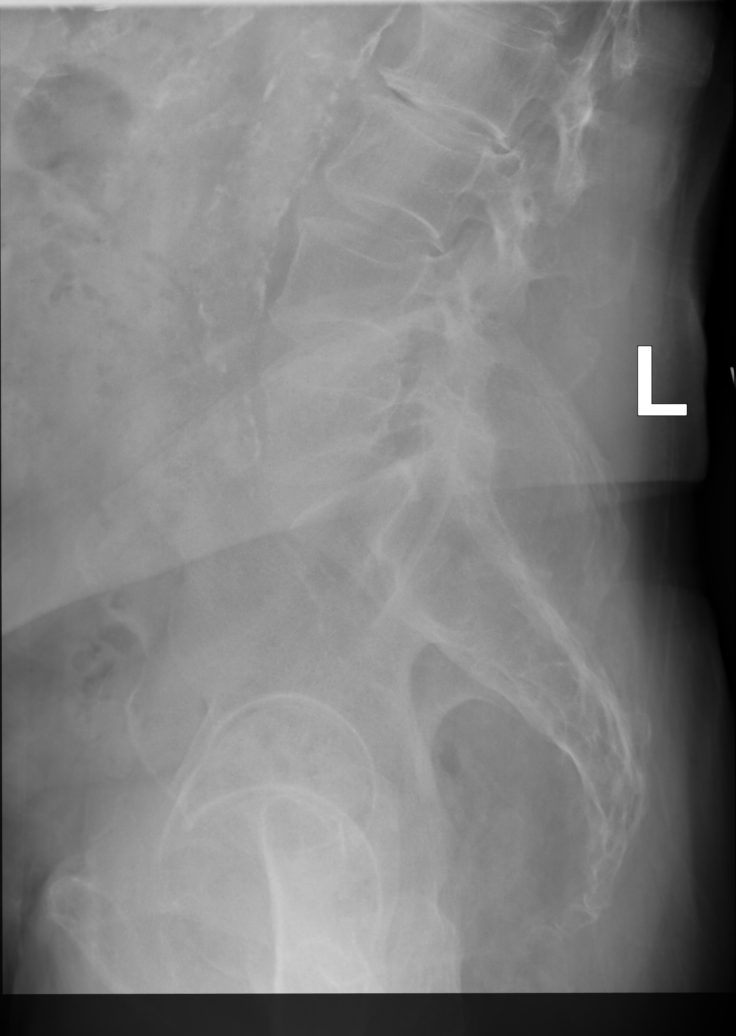

[5 of 5 positions shown; findings below may reference images not displayed]

FINDINGS: The bones are subjectively under mineralized. Slight levo scoliotic
curvature. No listhesis. Normal vertebral body heights. No evidence
of fracture. There is disc space narrowing and spurring at L1-L2 and
L2-L3. Minor lower lumbar facet hypertrophy. No visible focal bone
lesion or bone destruction. The sacroiliac joints are congruent.
IMPRESSION: 1. Degenerative disc disease at L1-L2 and L2-L3.
2. Minor lower lumbar facet hypertrophy.
3. No acute or suspicious bony abnormality.

## 2022-12-09 ENCOUNTER — Other Ambulatory Visit: Payer: Self-pay | Admitting: Family Medicine

## 2023-02-09 ENCOUNTER — Other Ambulatory Visit: Payer: Self-pay | Admitting: Family Medicine

## 2023-03-01 ENCOUNTER — Encounter: Payer: Self-pay | Admitting: Family Medicine

## 2023-03-01 ENCOUNTER — Ambulatory Visit: Payer: Medicare Other | Admitting: Family Medicine

## 2023-03-01 VITALS — BP 120/66 | HR 57 | Temp 98.2°F | Ht 72.0 in | Wt 171.2 lb

## 2023-03-01 DIAGNOSIS — I483 Typical atrial flutter: Secondary | ICD-10-CM | POA: Diagnosis not present

## 2023-03-01 DIAGNOSIS — F32A Depression, unspecified: Secondary | ICD-10-CM

## 2023-03-01 DIAGNOSIS — R011 Cardiac murmur, unspecified: Secondary | ICD-10-CM

## 2023-03-01 DIAGNOSIS — K219 Gastro-esophageal reflux disease without esophagitis: Secondary | ICD-10-CM | POA: Diagnosis not present

## 2023-03-01 DIAGNOSIS — I10 Essential (primary) hypertension: Secondary | ICD-10-CM

## 2023-03-01 DIAGNOSIS — R7303 Prediabetes: Secondary | ICD-10-CM

## 2023-03-01 DIAGNOSIS — F419 Anxiety disorder, unspecified: Secondary | ICD-10-CM

## 2023-03-01 LAB — BASIC METABOLIC PANEL
BUN: 24 mg/dL — ABNORMAL HIGH (ref 6–23)
CO2: 23 mEq/L (ref 19–32)
Calcium: 8.8 mg/dL (ref 8.4–10.5)
Chloride: 108 mEq/L (ref 96–112)
Creatinine, Ser: 1.25 mg/dL (ref 0.40–1.50)
GFR: 51.94 mL/min — ABNORMAL LOW (ref >60.00)
Glucose, Bld: 117 mg/dL — ABNORMAL HIGH (ref 70–99)
Potassium: 4.5 mEq/L (ref 3.5–5.1)
Sodium: 139 mEq/L (ref 135–145)

## 2023-03-01 LAB — HEMOGLOBIN A1C: Hgb A1c MFr Bld: 5.8 % (ref 4.6–6.5)

## 2023-03-01 MED ORDER — RAMIPRIL 5 MG PO CAPS: 5.0000 mg | ORAL_CAPSULE | Freq: Every day | ORAL | 3 refills | Status: DC

## 2023-03-01 NOTE — Assessment & Plan Note (Signed)
Chronic issue.  Sinus rhythm today.  Patient will continue Eliquis 5 mg twice daily and metoprolol 25 mg daily.

## 2023-03-01 NOTE — Assessment & Plan Note (Signed)
Noted on exam.  Patient had mild valvular disease on echo from 2022.  At this point we will monitor.  If his lightheadedness does not improve with reduction in blood pressure medication we may need to consider repeating an echo.

## 2023-03-01 NOTE — Assessment & Plan Note (Signed)
Chronic issue.  Well-controlled.  He can continue Pepcid once daily for this issue.

## 2023-03-01 NOTE — Assessment & Plan Note (Signed)
Generally well-controlled with some low blood pressures.  We will reduce his ramipril to 5 mg daily.  He will continue Imdur 30 mg daily and metoprolol 25 mg daily.  Recheck in 3 months.

## 2023-03-01 NOTE — Patient Instructions (Signed)
Nice to see you. We are going to reduce your ramipril to 5 mg daily.  If your lightheadedness continues with this dose reduction please let us know. We will contact you with your lab results.

## 2023-03-01 NOTE — Progress Notes (Signed)
Marikay Alar, MD Phone: [phone]  Rodney Delacruz is a 89 y.o. male who presents today for f/u.  HYPERTENSION Disease Monitoring Home BP Monitoring 95-162/48-75 Chest pain- no    Dyspnea- no Medications Compliance-  taking ramipril, imdur, metoprolol, notes he is no longer taking amlodipine. Lightheadedness-  yes  Edema- no change to chronic swelling    Syncope- no BMET    Component Value Date/Time   NA 140 06/15/2022 1038   NA 142 10/02/2012 0533   K 4.2 06/15/2022 1038   K 4.1 10/02/2012 0533   CL 106 06/15/2022 1038   CL 108 (H) 10/02/2012 0533   CO2 28 06/15/2022 1038   CO2 27 10/02/2012 0533   GLUCOSE 98 06/15/2022 1038   GLUCOSE 101 (H) 10/02/2012 0533   BUN 16 06/15/2022 1038   BUN 18 10/02/2012 0533   CREATININE 1.12 06/15/2022 1038   CREATININE 1.17 10/02/2012 0533   CALCIUM 8.3 (L) 06/15/2022 1038   CALCIUM 8.7 10/02/2012 0533   GFRNONAA >60 02/19/2021 0941   GFRNONAA >60 10/02/2012 0533   GFRAA >60 10/02/2012 0533   GERD:   Reflux symptoms: no   Abd pain: no   Blood in stool: no  Dysphagia: no   EGD: no  Medication: taking pepcid once in the morning  Anxiety: Patient has no anxiety.  No depression.  No SI.  He is on Zoloft.  Atrial flutter: Patient is on Eliquis and metoprolol.  No palpitations or bleeding issues.   Social History   Tobacco Use  Smoking Status Former   Current packs/day: 0.00   Average packs/day: 1 pack/day for 20.0 years (20.0 ttl pk-yrs)   Types: Cigarettes   Start date: 08/15/1956   Quit date: 08/15/1976   Years since quitting: 46.5  Smokeless Tobacco Never    Current Outpatient Medications on File Prior to Visit  Medication Sig Dispense Refill   acetaminophen (TYLENOL) 500 MG tablet Take 1,000 mg by mouth every 6 (six) hours as needed for moderate pain or headache.     Alirocumab (PRALUENT) 75 MG/ML SOAJ Inject 75 mg into the skin every 14 (fourteen) days. 6 mL 3   apixaban (ELIQUIS) 5 MG TABS tablet Take 1 tablet (5 mg  total) by mouth 2 (two) times daily. 180 tablet 3   diphenhydrAMINE (BENADRYL) 25 MG tablet Take 25 mg by mouth daily as needed for allergies.     fluticasone (FLONASE) 50 MCG/ACT nasal spray SPRAY 1 SPRAY IN EACH NOSTRIL ONCE DAILY AS NEEDED FOR ALLERGIES 16 mL 0   isosorbide mononitrate (IMDUR) 30 MG 24 hr tablet Take 1 tablet (30 mg total) by mouth daily. 90 tablet 2   metoprolol succinate (TOPROL-XL) 25 MG 24 hr tablet Take 1 tablet (25 mg total) by mouth daily. 90 tablet 2   Multiple Vitamin (MULTI-VITAMIN DAILY PO) Take 1 tablet by mouth daily.     nitroGLYCERIN (NITROSTAT) 0.4 MG SL tablet Place 1 tablet (0.4 mg total) under the tongue every 5 (five) minutes as needed for chest pain. 25 tablet 1   Polyethyl Glycol-Propyl Glycol (SYSTANE OP) Place 1 drop into both eyes daily.     sertraline (ZOLOFT) 50 MG tablet TAKE 1/2 TABLETS BY MOUTH FOR 14 DAYS, THEN 1 TABLET DAILY 90 tablet 1   No current facility-administered medications on file prior to visit.     ROS see history of present illness  Objective  Physical Exam Vitals:   03/01/23 1024  BP: 120/66  Pulse: (!) 57  Temp:  98.2 F (36.8 C)  SpO2: 96%    BP Readings from Last 3 Encounters:  03/01/23 120/66  08/31/22 130/70  08/22/22 (!) 138/58   Wt Readings from Last 3 Encounters:  03/01/23 171 lb 3.2 oz (77.7 kg)  08/31/22 171 lb 9.6 oz (77.8 kg)  08/22/22 172 lb 8 oz (78.2 kg)    Physical Exam Constitutional:      General: He is not in acute distress.    Appearance: He is not diaphoretic.  Cardiovascular:     Rate and Rhythm: Normal rate and regular rhythm.     Heart sounds: Murmur (2/6 systolic murmur) heard.  Pulmonary:     Effort: Pulmonary effort is normal.     Breath sounds: Normal breath sounds.  Skin:    General: Skin is warm and dry.  Neurological:     Mental Status: He is alert.      Assessment/Plan: Please see individual problem list.  Essential hypertension Assessment & Plan: Generally  well-controlled with some low blood pressures.  We will reduce his ramipril to 5 mg daily.  He will continue Imdur 30 mg daily and metoprolol 25 mg daily.  Recheck in 3 months.  Orders: -     Ramipril; Take 1 capsule (5 mg total) by mouth daily.  Dispense: 90 capsule; Refill: 3 -     Basic metabolic panel  Anxiety and depression Assessment & Plan: Chronic issue.  Attic.  Patient will continue Zoloft 50 mg daily.   Typical atrial flutter (HCC) Assessment & Plan: Chronic issue.  Sinus rhythm today.  Patient will continue Eliquis 5 mg twice daily and metoprolol 25 mg daily.   Gastroesophageal reflux disease, unspecified whether esophagitis present Assessment & Plan: Chronic issue.  Well-controlled.  He can continue Pepcid once daily for this issue.   Prediabetes -     Hemoglobin A1c  Heart murmur Assessment & Plan: Noted on exam.  Patient had mild valvular disease on echo from 2022.  At this point we will monitor.  If his lightheadedness does not improve with reduction in blood pressure medication we may need to consider repeating an echo.      Return in about 3 months (around 06/01/2023) for Blood pressure follow-up.   Marikay Alar, MD [facility]

## 2023-03-01 NOTE — Assessment & Plan Note (Signed)
Chronic issue.  Attic.  Patient will continue Zoloft 50 mg daily.

## 2023-03-28 ENCOUNTER — Other Ambulatory Visit: Payer: Self-pay | Admitting: Family Medicine

## 2023-04-13 ENCOUNTER — Other Ambulatory Visit: Payer: Self-pay | Admitting: Cardiovascular Disease

## 2023-04-13 DIAGNOSIS — I483 Typical atrial flutter: Secondary | ICD-10-CM

## 2023-04-13 NOTE — Telephone Encounter (Signed)
Please review

## 2023-04-13 NOTE — Telephone Encounter (Signed)
Eliquis 5mg  refill request received. Patient is 89 years old, weight-77.7kg, Crea-1.25 on 03/01/23, Diagnosis-Aflutter, and last seen by Dr. Mariah Milling on 08/22/22. Dose is appropriate based on dosing criteria. Will send in refill to requested pharmacy.

## 2023-04-24 DIAGNOSIS — L570 Actinic keratosis: Secondary | ICD-10-CM | POA: Diagnosis not present

## 2023-04-24 DIAGNOSIS — L723 Sebaceous cyst: Secondary | ICD-10-CM | POA: Diagnosis not present

## 2023-05-03 DIAGNOSIS — Z23 Encounter for immunization: Secondary | ICD-10-CM | POA: Diagnosis not present

## 2023-05-11 ENCOUNTER — Telehealth: Payer: Self-pay | Admitting: Pharmacy Technician

## 2023-05-11 DIAGNOSIS — Z5986 Financial insecurity: Secondary | ICD-10-CM

## 2023-05-11 NOTE — Progress Notes (Signed)
[facility])  [facility] Quality Pharmacy Team   05/11/2023  DUSTEN LACHAT [birth year] [id number]  Reason for referral: Medication assistance for Eliquis  Referral source:  Patient called me. Current insurance:  [employer] PDP  Outreach:  Successful telephone call with patient.  HIPAA identifiers verified. Patient informs income is relatively the same as last year as well as insurance and prescriber. He informs he believes he has met the 3% OOP requirement as established by [employer]. Based on this information, an application will be mailed to the patient.   Medication Assistance Findings:  Medication assistance needs identified: Eliquis  Extra Help:  Not eligible for Extra Help Low Income Subsidy based on reported income and assets  Additional medication assistance options reviewed with patient as warranted:  No other options identified  Plan: I will route patient assistance letter to [facility] pharmacy technician who will coordinate patient assistance program application process for medications listed above.  [facility] pharmacy technician will assist with obtaining all required documents from both patient and provider(s) and submit application(s) once completed.                                      Medication Assistance Referral  Referral From:  Patient  Medication/Company: Eliquis / [employer]MS Patient application portion:  Mailed Provider application portion: Faxed  to Dr. Julien Nordmann Provider address/fax verified via: Office website  [name], CPhT [place]  Office: [phone] Fax: [phone] Email: [email or web address]

## 2023-05-17 DIAGNOSIS — Z23 Encounter for immunization: Secondary | ICD-10-CM | POA: Diagnosis not present

## 2023-05-26 ENCOUNTER — Telehealth: Payer: Self-pay | Admitting: Pharmacy Technician

## 2023-05-26 DIAGNOSIS — Z5986 Financial insecurity: Secondary | ICD-10-CM

## 2023-05-26 NOTE — Progress Notes (Signed)
[facility])                                            [facility] Quality Pharmacy Team    05/26/2023  Rodney Delacruz [birth year] [id number]  Received both patient and provider portion(s) of patient assistance application(s) for Eliquis. Submitted completed application and required documents into BMS.   [name], CPhT [place]  Office: [phone] Fax: [phone] Email: [email or web address]

## 2023-06-02 ENCOUNTER — Ambulatory Visit: Payer: Medicare Other | Admitting: Family Medicine

## 2023-06-02 ENCOUNTER — Telehealth: Payer: Self-pay | Admitting: Pharmacy Technician

## 2023-06-02 DIAGNOSIS — Z5986 Financial insecurity: Secondary | ICD-10-CM

## 2023-06-02 NOTE — Progress Notes (Signed)
[facility])                                            [facility] Quality Pharmacy Team    06/02/2023  Rodney Delacruz 02-17-1934 [id number]  Care coordination call placed to [employer] in regard to Eliquis application.  Spoke to [place] who informs patient is APPROVED 05/30/2023-08/15/2023. Medication and subsequent refill shipments, if applicable, will automatically process and be delivered to the patient's home. If patient feels current supply is not adequate until next supply arrives, patient may call BMS at [phone].  [name], CPhT [place]  Office: [phone] Fax: [phone] Email: [email or web address]

## 2023-06-15 ENCOUNTER — Other Ambulatory Visit: Payer: Self-pay | Admitting: Family Medicine

## 2023-06-15 ENCOUNTER — Other Ambulatory Visit: Payer: Self-pay | Admitting: Cardiovascular Disease

## 2023-07-03 DIAGNOSIS — Z961 Presence of intraocular lens: Secondary | ICD-10-CM | POA: Diagnosis not present

## 2023-07-03 DIAGNOSIS — H353131 Nonexudative age-related macular degeneration, bilateral, early dry stage: Secondary | ICD-10-CM | POA: Diagnosis not present

## 2023-07-03 DIAGNOSIS — H43813 Vitreous degeneration, bilateral: Secondary | ICD-10-CM | POA: Diagnosis not present

## 2023-07-03 DIAGNOSIS — H40053 Ocular hypertension, bilateral: Secondary | ICD-10-CM | POA: Diagnosis not present

## 2023-07-16 ENCOUNTER — Other Ambulatory Visit: Payer: Self-pay | Admitting: Cardiovascular Disease

## 2023-07-19 ENCOUNTER — Other Ambulatory Visit: Payer: Self-pay

## 2023-07-19 DIAGNOSIS — F32A Depression, unspecified: Secondary | ICD-10-CM

## 2023-07-19 MED ORDER — SERTRALINE HCL 50 MG PO TABS
ORAL_TABLET | ORAL | 1 refills | Status: DC
Start: 2023-07-19 — End: 2024-03-13

## 2023-08-01 ENCOUNTER — Ambulatory Visit: Payer: Medicare Other | Admitting: Family Medicine

## 2023-08-01 ENCOUNTER — Encounter: Payer: Self-pay | Admitting: Family Medicine

## 2023-08-01 VITALS — BP 128/74 | HR 70 | Temp 98.2°F | Ht 72.0 in | Wt 172.8 lb

## 2023-08-01 DIAGNOSIS — I10 Essential (primary) hypertension: Secondary | ICD-10-CM

## 2023-08-01 DIAGNOSIS — I483 Typical atrial flutter: Secondary | ICD-10-CM

## 2023-08-01 DIAGNOSIS — R7303 Prediabetes: Secondary | ICD-10-CM | POA: Diagnosis not present

## 2023-08-01 DIAGNOSIS — E782 Mixed hyperlipidemia: Secondary | ICD-10-CM

## 2023-08-01 DIAGNOSIS — J309 Allergic rhinitis, unspecified: Secondary | ICD-10-CM | POA: Diagnosis not present

## 2023-08-01 LAB — LIPID PANEL
Cholesterol: 131 mg/dL (ref 0–200)
HDL: 53.7 mg/dL (ref >39.00)
LDL Cholesterol: 64 mg/dL (ref 0–99)
NonHDL: 76.92
Total CHOL/HDL Ratio: 2
Triglycerides: 64 mg/dL (ref 0.0–149.0)
VLDL: 12.8 mg/dL (ref 0.0–40.0)

## 2023-08-01 LAB — COMPREHENSIVE METABOLIC PANEL
ALT: 8 U/L (ref 0–53)
AST: 13 U/L (ref 0–37)
Albumin: 4 g/dL (ref 3.5–5.2)
Alkaline Phosphatase: 83 U/L (ref 39–117)
BUN: 23 mg/dL (ref 6–23)
CO2: 27 meq/L (ref 19–32)
Calcium: 8.8 mg/dL (ref 8.4–10.5)
Chloride: 106 meq/L (ref 96–112)
Creatinine, Ser: 1.26 mg/dL (ref 0.40–1.50)
GFR: 51.29 mL/min — ABNORMAL LOW (ref >60.00)
Glucose, Bld: 101 mg/dL — ABNORMAL HIGH (ref 70–99)
Potassium: 4.6 meq/L (ref 3.5–5.1)
Sodium: 139 meq/L (ref 135–145)
Total Bilirubin: 0.4 mg/dL (ref 0.2–1.2)
Total Protein: 6.5 g/dL (ref 6.0–8.3)

## 2023-08-01 LAB — HEMOGLOBIN A1C: Hgb A1c MFr Bld: 6.1 % (ref 4.6–6.5)

## 2023-08-01 MED ORDER — FLUTICASONE PROPIONATE 50 MCG/ACT NA SUSP
1.0000 | Freq: Every day | NASAL | 2 refills | Status: DC
Start: 2023-08-01 — End: 2024-02-20

## 2023-08-01 MED ORDER — LORATADINE 10 MG PO TABS
10.0000 mg | ORAL_TABLET | Freq: Every day | ORAL | 2 refills | Status: AC
Start: 2023-08-01 — End: ?

## 2023-08-01 NOTE — Assessment & Plan Note (Signed)
Chronic issue.  Well-controlled today.  Patient will continue ramipril 5 mg daily, metoprolol 25 mg daily, and Imdur 30 mg daily.

## 2023-08-01 NOTE — Assessment & Plan Note (Signed)
Chronic issue.  Patient will continue Flonase 1 sprays each nostril once daily.  We will trial Claritin 10 mg daily to see if that provides benefit.  If that dries him out too much she will let us know.

## 2023-08-01 NOTE — Assessment & Plan Note (Signed)
Chronic issue.  Continue Praluent 75 mg every 14 days.

## 2023-08-01 NOTE — Progress Notes (Signed)
Marikay Alar, MD Phone: [phone]  Rodney Delacruz is a 89 y.o. male who presents today for f/u.  HYPERTENSION/A flutter Disease Monitoring Chest pain- no    Dyspnea- no Palpitations- no Medications Compliance-  taking ramipril, metoprolol, imdur, eliquis  Edema- no Bleeding- no BMET    Component Value Date/Time   NA 139 03/01/2023 1050   NA 142 10/02/2012 0533   K 4.5 03/01/2023 1050   K 4.1 10/02/2012 0533   CL 108 03/01/2023 1050   CL 108 (H) 10/02/2012 0533   CO2 23 03/01/2023 1050   CO2 27 10/02/2012 0533   GLUCOSE 117 (H) 03/01/2023 1050   GLUCOSE 101 (H) 10/02/2012 0533   BUN 24 (H) 03/01/2023 1050   BUN 18 10/02/2012 0533   CREATININE 1.25 03/01/2023 1050   CREATININE 1.17 10/02/2012 0533   CALCIUM 8.8 03/01/2023 1050   CALCIUM 8.7 10/02/2012 0533   GFRNONAA >60 02/19/2021 0941   GFRNONAA >60 10/02/2012 0533   GFRAA >60 10/02/2012 0533   HYPERLIPIDEMIA Symptoms Chest pain on exertion:  no   Medications: Compliance- taking praluent Right upper quadrant pain- no  Muscle aches- no Lipid Panel     Component Value Date/Time   CHOL 107 06/15/2022 1038   CHOL 148 10/01/2012 1305   TRIG 53.0 06/15/2022 1038   TRIG 89 10/01/2012 1305   HDL 48.90 06/15/2022 1038   HDL 49 10/01/2012 1305   CHOLHDL 2 06/15/2022 1038   VLDL 10.6 06/15/2022 1038   VLDL 18 10/01/2012 1305   LDLCALC 48 06/15/2022 1038   LDLCALC 81 10/01/2012 1305   LDLDIRECT 124.0 04/23/2019 0828   Allergic rhinitis: Patient notes postnasal drip and sneezing for a while now.  He does uses Flonase with some benefit.  No rhinorrhea.,  No congestion or fevers.  He occasionally takes Benadryl.  Social History   Tobacco Use  Smoking Status Former   Current packs/day: 0.00   Average packs/day: 1 pack/day for 20.0 years (20.0 ttl pk-yrs)   Types: Cigarettes   Start date: 08/15/1956   Quit date: 08/15/1976   Years since quitting: 46.9  Smokeless Tobacco Never    Current Outpatient Medications on  File Prior to Visit  Medication Sig Dispense Refill   acetaminophen (TYLENOL) 500 MG tablet Take 1,000 mg by mouth every 6 (six) hours as needed for moderate pain or headache.     Alirocumab (PRALUENT) 75 MG/ML SOAJ Inject 75 mg into the skin every 14 (fourteen) days. 6 mL 3   apixaban (ELIQUIS) 5 MG TABS tablet TAKE 1 TABLET BY MOUTH TWICE A DAY 180 tablet 1   diphenhydrAMINE (BENADRYL) 25 MG tablet Take 25 mg by mouth daily as needed for allergies.     isosorbide mononitrate (IMDUR) 30 MG 24 hr tablet TAKE 1 TABLET BY MOUTH DAILY 90 tablet 0   metoprolol succinate (TOPROL-XL) 25 MG 24 hr tablet TAKE 1 TABLET BY MOUTH DAILY 90 tablet 0   Multiple Vitamin (MULTI-VITAMIN DAILY PO) Take 1 tablet by mouth daily.     nitroGLYCERIN (NITROSTAT) 0.4 MG SL tablet Place 1 tablet (0.4 mg total) under the tongue every 5 (five) minutes as needed for chest pain. 25 tablet 1   Polyethyl Glycol-Propyl Glycol (SYSTANE OP) Place 1 drop into both eyes daily.     ramipril (ALTACE) 5 MG capsule Take 1 capsule (5 mg total) by mouth daily. 90 capsule 3   sertraline (ZOLOFT) 50 MG tablet TAKE 1/2 TABLETS BY MOUTH FOR 14 DAYS, THEN 1 TABLET  DAILY 90 tablet 1   No current facility-administered medications on file prior to visit.     ROS see history of present illness  Objective  Physical Exam Vitals:   08/01/23 0935  BP: 128/74  Pulse: 70  Temp: 98.2 F (36.8 C)  SpO2: 97%    BP Readings from Last 3 Encounters:  08/01/23 128/74  03/01/23 120/66  08/31/22 130/70   Wt Readings from Last 3 Encounters:  08/01/23 172 lb 12.8 oz (78.4 kg)  03/01/23 171 lb 3.2 oz (77.7 kg)  08/31/22 171 lb 9.6 oz (77.8 kg)    Physical Exam Constitutional:      General: He is not in acute distress.    Appearance: He is not diaphoretic.  Cardiovascular:     Rate and Rhythm: Normal rate and regular rhythm.     Heart sounds: Normal heart sounds.  Pulmonary:     Effort: Pulmonary effort is normal.     Breath sounds:  Normal breath sounds.  Musculoskeletal:     Right lower leg: No edema.     Left lower leg: No edema.  Skin:    General: Skin is warm and dry.  Neurological:     Mental Status: He is alert.      Assessment/Plan: Please see individual problem list.  Typical atrial flutter (HCC) Assessment & Plan: Chronic issue.  Sinus rhythm today.  Patient will continue Eliquis 5 mg twice daily and metoprolol 25 mg daily.   Essential hypertension Assessment & Plan: Chronic issue.  Well-controlled today.  Patient will continue ramipril 5 mg daily, metoprolol 25 mg daily, and Imdur 30 mg daily.  Orders: -     Comprehensive metabolic panel  Mixed hyperlipidemia Assessment & Plan: Chronic issue.  Continue Praluent 75 mg every 14 days.  Orders: -     Comprehensive metabolic panel -     Lipid panel  Allergic rhinitis, unspecified seasonality, unspecified trigger Assessment & Plan: Chronic issue.  Patient will continue Flonase 1 sprays each nostril once daily.  We will trial Claritin 10 mg daily to see if that provides benefit.  If that dries him out too much she will let us know.  Orders: -     Fluticasone Propionate; Place 1 spray into both nostrils daily.  Dispense: 16 mL; Refill: 2 -     Loratadine; Take 1 tablet (10 mg total) by mouth daily.  Dispense: 30 tablet; Refill: 2  Prediabetes -     Hemoglobin A1c    Return in about 6 months (around 01/30/2024) for transfer of care.   Marikay Alar, MD [facility]

## 2023-08-01 NOTE — Assessment & Plan Note (Signed)
Chronic issue.  Sinus rhythm today.  Patient will continue Eliquis 5 mg twice daily and metoprolol 25 mg daily.

## 2023-09-18 NOTE — Progress Notes (Signed)
 Date:  09/19/2023   ID:  Rodney Delacruz, DOB Dec 27, 1933, MRN [id number]  Patient Location:  [address] [place] [address]   Provider location:   [facility], [place] office  PCP:  Maribeth Camellia MATSU, MD  Cardiologist:  Perla MOCCASIN [facility]  Chief Complaint  Patient presents with   12 month follow up     Patient c/o chest pain and shortness of breath with walking a short distance.     History of Present Illness:   Rodney Delacruz is a 89 yo male with  past medical history of coronary artery disease, prior PCI and bypass surgery in 2008 at [facility],  presenting to [facility] for chest pain on 10/30/2012.  ruled out for MI  Myoview  study that showed no ischemia.  GERD.   prior smoking history, stopped in the 70s,  no diabetes Cardiac catheterization November 2022 Severe three-vessel disease occluded mid left main, severe diffuse disease of RCA, patent LIMA to the LAD, vein graft to OM1, vein graft to right PDA, mid to distal LAD with stent that is occluded, proximal LAD supplied partially by retrograde flow from vein graft to OM1 He presents today for routine followup of his coronary artery disease, atrial flutter  LOV: 08/2022 In follow-up today reports he is having more back and chest discomfort on ambulation Goes down to the mailbox slight incline down, walking back up to the house developed some back and chest discomfort Does not feel as good as it used to, concerned this could be angina  No regular exercise program  Hobbies include reading/TV, shopping  Denies tachypalpitations concerning for arrhythmia Maintaining normal sinus rhythm today  Tolerating isosorbide  30 daily Has not had to take sublingual nitro  Lab work reviewed HA1C 6.1 Total chol 131 LDL 64 on Praluent   EKG personally reviewed by myself on todays visit EKG Interpretation Date/Time:  Tuesday September 19 2023 13:50:19 EST Ventricular Rate:  68 PR Interval:    QRS  Duration:  86 QT Interval:  404 QTC Calculation: 429 R Axis:   -19  Text Interpretation: Normal sinus rhythm Nonspecific ST and T wave abnormality When compared with ECG of 23-Feb-2021 07:37, No significant change was found Confirmed by Perla Lye [phone]) on 09/19/2023 2:11:05 PM   Prior procedure details reviewed Cath 07/07/21 for angina 1.  Severe underlying three-vessel and left main coronary artery disease with occluded mid left main and severe diffuse disease in the native right coronary artery.  Patent grafts including LIMA to distal LAD, SVG to OM1 and SVG to right PDA.  The mid to distal LAD has a stent that is occluded.  The proximal LAD is supplied partially by retrograde flow from SVG to OM1.  2.  Left ventricular angiography was not performed.  EF was normal by echo.  Normal left ventricular end-diastolic pressure.   Recommendation for continued medical therapy.  The patient's graft are patent with no significant disease.    Echo 6/22 Echocardiogram Normal cardiac function right and left ventricle, no significant valve disease  History of statin intolerance  Denies any tachycardia concerning for recurrent atrial fib flutter 12/23/2020 in the office , new atrial flutter with controlled ventricular response.   started on Eliquis  5 mg twice daily Cardioversion 02/23/21, back in NS[facility]  Prior statin myalgias on Crestor , Lipitor After missing his Zetia  in the past  Last cardiac catheterization 2009  Stress test February 2014  Past Medical History:  Diagnosis Date   Allergy  Anxiety    Coronary artery disease    Depression    Elevated PSA    Followed by Dr. Bernardo every 6 month   Heart murmur    Hyperlipidemia    Hypertension    MI (myocardial infarction) [facility]) 2009   Ulcer    Past Surgical History:  Procedure Laterality Date   CARDIAC CATHETERIZATION  2005, 2009   CARDIOVERSION N/A 02/23/2021   Procedure: CARDIOVERSION;  Surgeon: Perla Evalene PARAS, MD;   Location: [facility] ORS;  Service: Cardiovascular;  Laterality: N/A;   CHOLECYSTECTOMY  2001   CORONARY ANGIOPLASTY  2005   s/p stent placement @ [facility]   CORONARY ARTERY BYPASS GRAFT  2009   [facility]   LEFT HEART CATH AND CORS/GRAFTS ANGIOGRAPHY Left 07/07/2021   Procedure: LEFT HEART CATH AND CORS/GRAFTS ANGIOGRAPHY;  Surgeon: Darron Deatrice LABOR, MD;  Location: [facility] INVASIVE CV LAB;  Service: Cardiovascular;  Laterality: Left;   TONSILLECTOMY AND ADENOIDECTOMY  1942     Current Meds  Medication Sig   acetaminophen  (TYLENOL ) 500 MG tablet Take 1,000 mg by mouth every 6 (six) hours as needed for moderate pain or headache.   Alirocumab  (PRALUENT ) 75 MG/ML SOAJ Inject 75 mg into the skin every 14 (fourteen) days.   apixaban  (ELIQUIS ) 5 MG TABS tablet TAKE 1 TABLET BY MOUTH TWICE A DAY   diphenhydrAMINE (BENADRYL) 25 MG tablet Take 25 mg by mouth daily as needed for allergies.   fluticasone  (FLONASE ) 50 MCG/ACT nasal spray Place 1 spray into both nostrils daily.   loratadine  (CLARITIN ) 10 MG tablet Take 1 tablet (10 mg total) by mouth daily.   metoprolol  succinate (TOPROL -XL) 25 MG 24 hr tablet TAKE 1 TABLET BY MOUTH DAILY   Multiple Vitamin (MULTI-VITAMIN DAILY PO) Take 1 tablet by mouth daily.   Polyethyl Glycol-Propyl Glycol (SYSTANE OP) Place 1 drop into both eyes daily.   ramipril  (ALTACE ) 5 MG capsule Take 1 capsule (5 mg total) by mouth daily.   sertraline  (ZOLOFT ) 50 MG tablet TAKE 1/2 TABLETS BY MOUTH FOR 14 DAYS, THEN 1 TABLET DAILY   [DISCONTINUED] isosorbide  mononitrate (IMDUR ) 30 MG 24 hr tablet TAKE 1 TABLET BY MOUTH DAILY   [DISCONTINUED] nitroGLYCERIN  (NITROSTAT ) 0.4 MG SL tablet Place 1 tablet (0.4 mg total) under the tongue every 5 (five) minutes as needed for chest pain.     Allergies:   Plavix  [clopidogrel  bisulfate]   Social History   Tobacco Use   Smoking status: Former    Current packs/day: 0.00    Average packs/day: 1 pack/day for 20.0 years (20.0 ttl pk-yrs)    Types:  Cigarettes    Start date: 08/15/1956    Quit date: 08/15/1976    Years since quitting: 47.1   Smokeless tobacco: Never  Vaping Use   Vaping status: Never Used  Substance Use Topics   Alcohol use: Yes    Alcohol/week: 0.0 standard drinks of alcohol    Comment: Occasional   Drug use: No     Family Hx: The patient's family history includes Alcohol abuse in his father and paternal grandfather; Diabetes in his sister; Heart attack in his brother; Heart disease in his brother; Stroke in his mother.  ROS:   Please see the history of present illness.    Review of Systems  Constitutional: Negative.   HENT: Negative.    Respiratory: Negative.    Cardiovascular:  Positive for chest pain.  Gastrointestinal: Negative.   Musculoskeletal: Negative.   Neurological: Negative.   Psychiatric/Behavioral: Negative.  All other systems reviewed and are negative.    Labs/Other Tests and Data Reviewed:    Recent Labs: 08/01/2023: ALT 8; BUN 23; Creatinine, Ser 1.26; Potassium 4.6; Sodium 139   Recent Lipid Panel Lab Results  Component Value Date/Time   CHOL 131 08/01/2023 09:58 AM   CHOL 148 10/01/2012 01:05 PM   TRIG 64.0 08/01/2023 09:58 AM   TRIG 89 10/01/2012 01:05 PM   HDL 53.70 08/01/2023 09:58 AM   HDL 49 10/01/2012 01:05 PM   CHOLHDL 2 08/01/2023 09:58 AM   LDLCALC 64 08/01/2023 09:58 AM   LDLCALC 81 10/01/2012 01:05 PM   LDLDIRECT 124.0 04/23/2019 08:28 AM    Wt Readings from Last 3 Encounters:  09/19/23 176 lb 6 oz (80 kg)  08/01/23 172 lb 12.8 oz (78.4 kg)  03/01/23 171 lb 3.2 oz (77.7 kg)     Exam:    BP (!) 140/54 (BP Location: Left Arm, Patient Position: Sitting, Cuff Size: Normal)   Pulse 68   Ht 6' (1.829 m)   Wt 176 lb 6 oz (80 kg)   SpO2 93%   BMI 23.92 kg/m  Constitutional:  oriented to person, place, and time. No distress.  HENT:  Head: Grossly normal Eyes:  no discharge. No scleral icterus.  Neck: No JVD, no carotid bruits  Cardiovascular: Regular rate  and rhythm, no murmurs appreciated Pulmonary/Chest: Clear to auscultation bilaterally, no wheezes or rails Abdominal: Soft.  no distension.  no tenderness.  Musculoskeletal: Normal range of motion Neurological:  normal muscle tone. Coordination normal. No atrophy Skin: Skin warm and dry Psychiatric: normal affect, pleasant  ASSESSMENT & PLAN:    Atrial flutter Maintaining normal sinus rhythm Prior cardioversion,  Continue Eliquis  5 twice daily, metoprolol  succinate 25 daily  Atherosclerosis of native coronary artery of native heart with stable angina pectoris (HCC) Reports having chest discomfort concerning for angina, typically on exertion Recommend he increase isosorbide  up to 30 twice daily Pharmacologic Myoview  ordered to rule out high risk ischemia Discussed prior cardiac catheterization 2022, severe three-vessel native disease with patent grafts For worsening symptoms could consider cardiac catheterization  Essential hypertension Increase isosorbide  up to 30 twice daily  S/P CABG (coronary artery bypass graft) Stable grafts in 2022 Having anginal symptoms as above  Hyperlipidemia Continue Praluent , cholesterol at goal   Signed, Evalene Lunger, MD  09/19/2023 2:12 PM    [facility] [address], [place], [place] [address]

## 2023-09-19 ENCOUNTER — Ambulatory Visit: Payer: Medicare Other | Attending: Cardiovascular Disease | Admitting: Cardiovascular Disease

## 2023-09-19 ENCOUNTER — Encounter: Payer: Self-pay | Admitting: Cardiovascular Disease

## 2023-09-19 VITALS — BP 140/54 | HR 68 | Ht 72.0 in | Wt 176.4 lb

## 2023-09-19 DIAGNOSIS — M7989 Other specified soft tissue disorders: Secondary | ICD-10-CM | POA: Insufficient documentation

## 2023-09-19 DIAGNOSIS — I25118 Atherosclerotic heart disease of native coronary artery with other forms of angina pectoris: Secondary | ICD-10-CM | POA: Diagnosis not present

## 2023-09-19 DIAGNOSIS — R079 Chest pain, unspecified: Secondary | ICD-10-CM | POA: Diagnosis not present

## 2023-09-19 DIAGNOSIS — E785 Hyperlipidemia, unspecified: Secondary | ICD-10-CM | POA: Diagnosis not present

## 2023-09-19 DIAGNOSIS — I10 Essential (primary) hypertension: Secondary | ICD-10-CM | POA: Insufficient documentation

## 2023-09-19 DIAGNOSIS — D649 Anemia, unspecified: Secondary | ICD-10-CM | POA: Insufficient documentation

## 2023-09-19 DIAGNOSIS — T466X5A Adverse effect of antihyperlipidemic and antiarteriosclerotic drugs, initial encounter: Secondary | ICD-10-CM | POA: Insufficient documentation

## 2023-09-19 DIAGNOSIS — Z951 Presence of aortocoronary bypass graft: Secondary | ICD-10-CM | POA: Insufficient documentation

## 2023-09-19 DIAGNOSIS — T466X5D Adverse effect of antihyperlipidemic and antiarteriosclerotic drugs, subsequent encounter: Secondary | ICD-10-CM | POA: Diagnosis not present

## 2023-09-19 DIAGNOSIS — I483 Typical atrial flutter: Secondary | ICD-10-CM | POA: Diagnosis not present

## 2023-09-19 DIAGNOSIS — M791 Myalgia, unspecified site: Secondary | ICD-10-CM | POA: Insufficient documentation

## 2023-09-19 MED ORDER — NITROGLYCERIN 0.4 MG SL SUBL: 0.4000 mg | SUBLINGUAL_TABLET | SUBLINGUAL | 3 refills | Status: AC | PRN

## 2023-09-19 MED ORDER — ISOSORBIDE MONONITRATE ER 30 MG PO TB24: 30.0000 mg | ORAL_TABLET | Freq: Two times a day (BID) | ORAL | 3 refills | Status: DC

## 2023-09-19 NOTE — Patient Instructions (Addendum)
 Medication Instructions:  Please increase the isosorbide  up to 30 mg twice a day  If you need a refill on your cardiac medications before your next appointment, please call your pharmacy.   Lab work: No new labs needed  Testing/Procedures: Your provider has ordered a Lexiscan / Exercise Myoview  Stress test. This will take place at [facility]. Please report to the [facility] medical mall entrance. The volunteers at the first desk will direct you where to go.  [facility] MYOVIEW   Your provider has ordered a Stress Test with nuclear imaging. The purpose of this test is to evaluate the blood supply to your heart muscle. This procedure is referred to as a Non-Invasive Stress Test. This is because other than having an IV started in your vein, nothing is inserted or invades your body. Cardiac stress tests are done to find areas of poor blood flow to the heart by determining the extent of coronary artery disease (CAD). Some patients exercise on a treadmill, which naturally increases the blood flow to your heart, while others who are unable to walk on a treadmill due to physical limitations will have a pharmacologic/chemical stress agent called Lexiscan  . This medicine will mimic walking on a treadmill by temporarily increasing your coronary blood flow.   Please note: these test may take anywhere between 2-4 hours to complete  How to prepare for your Myoview  test:  Nothing to eat for 6 hours prior to the test No caffeine for 24 hours prior to test No smoking 24 hours prior to test. Your medication may be taken with water.  If your doctor stopped a medication because of this test, do not take that medication. Ladies, please do not wear dresses.  Skirts or pants are appropriate. Please wear a short sleeve shirt. No perfume, cologne or lotion. Wear comfortable walking shoes. No heels!   PLEASE NOTIFY THE OFFICE AT LEAST 24 HOURS IN ADVANCE IF YOU ARE UNABLE TO KEEP YOUR APPOINTMENT.  [phone] AND  PLEASE  NOTIFY NUCLEAR MEDICINE AT [facility] AT LEAST 24 HOURS IN ADVANCE IF YOU ARE UNABLE TO KEEP YOUR APPOINTMENT. [phone]   Follow-Up: At [facility], you and your health needs are our priority.  As part of our continuing mission to provide you with exceptional heart care, we have created designated Provider Care Teams.  These Care Teams include your primary Cardiologist (physician) and Advanced Practice Providers (APPs -  Physician Assistants and Nurse Practitioners) who all work together to provide you with the care you need, when you need it.  You will need a follow up appointment in 6 months  Providers on your designated Care Team:   Lonni Meager, NP Bernardino Bring, PA-C [place], [place]  COVID-19 Vaccine Information can be found at: [email or web address] For questions related to vaccine distribution or appointments, please email [email or web address] or call [phone].

## 2023-09-26 ENCOUNTER — Ambulatory Visit
Admission: RE | Admit: 2023-09-26 | Discharge: 2023-09-26 | Disposition: A | Discharge status: Home / Self Care | Payer: Medicare Other | Source: Ambulatory Visit | Attending: Cardiovascular Disease | Admitting: Cardiovascular Disease

## 2023-09-26 DIAGNOSIS — Z951 Presence of aortocoronary bypass graft: Secondary | ICD-10-CM | POA: Insufficient documentation

## 2023-09-26 DIAGNOSIS — I25118 Atherosclerotic heart disease of native coronary artery with other forms of angina pectoris: Secondary | ICD-10-CM | POA: Insufficient documentation

## 2023-09-26 DIAGNOSIS — R079 Chest pain, unspecified: Secondary | ICD-10-CM | POA: Insufficient documentation

## 2023-09-26 MED ORDER — TECHNETIUM TC 99M TETROFOSMIN IV KIT
31.6000 | PACK | Freq: Once | INTRAVENOUS | Status: AC | PRN
  Administered 2023-09-26: 31.6 via INTRAVENOUS

## 2023-09-26 MED ORDER — TECHNETIUM TC 99M TETROFOSMIN IV KIT
10.0000 | PACK | Freq: Once | INTRAVENOUS | Status: AC | PRN
  Administered 2023-09-26: 10.85 via INTRAVENOUS

## 2023-09-26 MED ORDER — REGADENOSON 0.4 MG/5ML IV SOLN
0.4000 mg | Freq: Once | INTRAVENOUS | Status: AC
  Administered 2023-09-26: 0.4 mg via INTRAVENOUS

## 2023-09-27 LAB — NM MYOCAR MULTI W/SPECT W/WALL MOTION / EF
LV dias vol: 82 mL (ref 62–150)
LV sys vol: 22 mL
Nuc Stress EF: 73 %
Peak HR: 85 {beats}/min
Rest HR: 65 {beats}/min
Rest Nuclear Isotope Dose: 10.9 mCi
SDS: 1
SRS: 7
SSS: 3
ST Depression (mm): 0 mm
Stress Nuclear Isotope Dose: 31.6 mCi
TID: 0.78

## 2023-09-28 ENCOUNTER — Other Ambulatory Visit: Payer: Self-pay | Admitting: Cardiovascular Disease

## 2023-09-29 ENCOUNTER — Encounter: Payer: Self-pay | Admitting: Cardiovascular Disease

## 2023-10-05 ENCOUNTER — Telehealth: Payer: Self-pay

## 2023-10-05 ENCOUNTER — Other Ambulatory Visit: Payer: Self-pay | Admitting: Pharmacist

## 2023-10-05 DIAGNOSIS — E782 Mixed hyperlipidemia: Secondary | ICD-10-CM

## 2023-10-05 NOTE — Telephone Encounter (Signed)
 Copied from CRM [id number]. Topic: Clinical - Prescription Issue >> Oct 05, 2023  2:59 PM Theodis Sato wrote: Reason for CRM: Please call patient to go over medication assistance program for Alirocumab (PRALUENT) 75 MG/ML SOAJ   - Please call [phone]

## 2023-10-05 NOTE — Progress Notes (Signed)
 [employer] - Re-enrollment 2025   Medication(s): All cholesterol medications (Prescribed Praluent)   Currently Enrolled: Through 06/14/23; Eligible for re-enrollment    Application Status:  Approved for re-enrollment    [employer] ID: [id number] Fund: Hypercholesterolemia - Medicare Access Assistance Type: Co-pay Start Date: 09/05/2023 End Date: 09/03/2024               Rx Card: Card No.  [id number] RX BIN:  [id number] PCN:  [id number] Group:  [id number]   Liliana Cline and provided the updated copay card information to the pharmacist.  No further action needed at this time.   Spoke with patient via phone to relay this information, He has No further questions,      Loree Fee, PharmD Clinical Pharmacist [facility] [phone]

## 2023-10-07 ENCOUNTER — Other Ambulatory Visit: Payer: Self-pay | Admitting: Family Medicine

## 2023-10-07 DIAGNOSIS — I2510 Atherosclerotic heart disease of native coronary artery without angina pectoris: Secondary | ICD-10-CM

## 2023-10-17 DIAGNOSIS — L988 Other specified disorders of the skin and subcutaneous tissue: Secondary | ICD-10-CM | POA: Diagnosis not present

## 2023-10-17 DIAGNOSIS — L570 Actinic keratosis: Secondary | ICD-10-CM | POA: Diagnosis not present

## 2023-10-17 DIAGNOSIS — L820 Inflamed seborrheic keratosis: Secondary | ICD-10-CM | POA: Diagnosis not present

## 2023-10-17 DIAGNOSIS — C44319 Basal cell carcinoma of skin of other parts of face: Secondary | ICD-10-CM | POA: Diagnosis not present

## 2023-10-17 DIAGNOSIS — D224 Melanocytic nevi of scalp and neck: Secondary | ICD-10-CM | POA: Diagnosis not present

## 2023-10-17 DIAGNOSIS — D485 Neoplasm of uncertain behavior of skin: Secondary | ICD-10-CM | POA: Diagnosis not present

## 2023-10-17 DIAGNOSIS — D225 Melanocytic nevi of trunk: Secondary | ICD-10-CM | POA: Diagnosis not present

## 2023-10-17 DIAGNOSIS — D2230 Melanocytic nevi of unspecified part of face: Secondary | ICD-10-CM | POA: Diagnosis not present

## 2023-11-01 DIAGNOSIS — C4441 Basal cell carcinoma of skin of scalp and neck: Secondary | ICD-10-CM | POA: Diagnosis not present

## 2023-11-01 DIAGNOSIS — C44319 Basal cell carcinoma of skin of other parts of face: Secondary | ICD-10-CM | POA: Diagnosis not present

## 2023-11-13 DIAGNOSIS — C4441 Basal cell carcinoma of skin of scalp and neck: Secondary | ICD-10-CM | POA: Diagnosis not present

## 2023-11-13 DIAGNOSIS — L988 Other specified disorders of the skin and subcutaneous tissue: Secondary | ICD-10-CM | POA: Diagnosis not present

## 2023-11-22 DIAGNOSIS — C4441 Basal cell carcinoma of skin of scalp and neck: Secondary | ICD-10-CM | POA: Diagnosis not present

## 2023-12-29 ENCOUNTER — Ambulatory Visit: Attending: Nurse Practitioner | Admitting: Nurse Practitioner

## 2023-12-29 ENCOUNTER — Encounter: Payer: Self-pay | Admitting: Nurse Practitioner

## 2023-12-29 VITALS — BP 128/58 | HR 69 | Ht 72.0 in | Wt 175.0 lb

## 2023-12-29 DIAGNOSIS — I200 Unstable angina: Secondary | ICD-10-CM | POA: Insufficient documentation

## 2023-12-29 DIAGNOSIS — I10 Essential (primary) hypertension: Secondary | ICD-10-CM | POA: Diagnosis not present

## 2023-12-29 DIAGNOSIS — E785 Hyperlipidemia, unspecified: Secondary | ICD-10-CM | POA: Insufficient documentation

## 2023-12-29 DIAGNOSIS — I25110 Atherosclerotic heart disease of native coronary artery with unstable angina pectoris: Secondary | ICD-10-CM | POA: Diagnosis not present

## 2023-12-29 DIAGNOSIS — I25118 Atherosclerotic heart disease of native coronary artery with other forms of angina pectoris: Secondary | ICD-10-CM | POA: Diagnosis not present

## 2023-12-29 DIAGNOSIS — I483 Typical atrial flutter: Secondary | ICD-10-CM | POA: Insufficient documentation

## 2023-12-29 MED ORDER — ASPIRIN 81 MG PO TBEC: 81.0000 mg | DELAYED_RELEASE_TABLET | Freq: Every day | ORAL | Status: DC

## 2023-12-29 NOTE — Patient Instructions (Signed)
 Medication Instructions:  Your physician recommends that you continue on your current medications as directed. Please refer to the Current Medication list given to you today.  *If you need a refill on your cardiac medications before your next appointment, please call your pharmacy*  Lab Work: Your provider recommends that you have labs drawn today: BMET &CBC  If you have labs (blood work) drawn today and your tests are completely normal, you will receive your results only by: MyChart Message (if you have MyChart) OR A paper copy in the mail If you have any lab test that is abnormal or we need to change your treatment, we will call you to review the results.  Testing/Procedures: See below  Follow-Up: At [facility], you and your health needs are our priority.  As part of our continuing mission to provide you with exceptional heart care, our providers are all part of one team.  This team includes your primary Cardiologist (physician) and Advanced Practice Providers or APPs (Physician Assistants and Nurse Practitioners) who all work together to provide you with the care you need, when you need it.  Your next appointment:   2 week(s) post procedure (5/28)  Provider:   Belva Boyden, MD or Laneta Pintos, NP    We recommend signing up for the patient portal called "MyChart".  Sign up information is provided on this After Visit Summary.  MyChart is used to connect with patients for Virtual Visits (Telemedicine).  Patients are able to view lab/test results, encounter notes, upcoming appointments, etc.  Non-urgent messages can be sent to your provider as well.   To learn more about what you can do with MyChart, go to [email or web address].   Other Instructions       Cardiac/Peripheral Catheterization   You are scheduled for a Cardiac Catheterization on Wednesday, May 28 with Dr. Antionette Kirks.  1. Please arrive at the [facility] Entrance of [facility], [address], [place] [address] at 11:30 AM (This is 1 hour(s) prior to your procedure time).  Proceed to the Check-In Desk directly inside the entrance.  Procedure Parking: Use the entrance off of the [address] side of the hospital. Turn right upon entering and follow the driveway to parking that is directly in front of the Heart [facility] Vascular Center. There is no valet parking available at this entrance, however there is an awning directly in front of the Heart & Vascular Center for drop off/ pick up for patients.      Special note: Every effort is made to have your procedure done on time. Please understand that emergencies sometimes delay scheduled procedures.  2. Diet: Do not eat solid foods after midnight.  You may have clear liquids until 5 AM the day of the procedure.  3. Labs: You will need to have blood drawn today (5/16).  4. Medication instructions in preparation for your procedure:   Stop taking Eliquis  (Apixiban) on Saturday, May 25.   On the morning of your procedure, take Aspirin  81 mg and any morning medicines NOT listed above.  You may use sips of water.  5. Plan to go home the same day, you will only stay overnight if medically necessary. 6. You MUST have a responsible adult to drive you home. 7. An adult MUST be with you the first 24 hours after you arrive home. 8. Bring a current list of your medications, and the last time and date medication taken. 9. Bring ID and current insurance cards.  10.Please wear clothes that are easy to get on and off and wear slip-on shoes.  Thank you for allowing us  to care for you!   -- [place] [facility]

## 2023-12-29 NOTE — Progress Notes (Signed)
 Office Visit    Patient Name: Rodney Delacruz Date of Encounter: 12/29/2023  Primary Care Provider:  Calista Catching, FNP Primary Cardiologist:  Belva Boyden, MD  Chief Complaint    89 y.o. male with a history of CAD status post prior PCI and bypass surgery in 2008 at [facility], hypertension, hyperlipidemia, and atrial flutter, who presents for follow-up of unstable angina.  Past Medical History   Subjective   Past Medical History:  Diagnosis Date   Allergy    Anxiety    Coronary artery disease    a. s/p remote LAD PCI; b. 2008 s/p CABG; c. 06/2021 Cath: LM 138m/d, LAD 60p/m, 180m/d ISR, LCX mild diff dzs, RCA 80ost/p, 50p/m, 50m/d, VG->OM1 nl, VG->RPDA nl, LIMA->dLAD nl-->Med rx; c. 09/2023 MV: No isch/infarct.   Depression    Elevated PSA    Followed by Dr. Bud Care every 6 month   Heart murmur    a. 01/2021 Echo: EF 60-65%, no rwma, nl RV fxn, nl RVSP, mildly dil LA, mild MR, mild-mod TR, mild-mod AoV sclerosis w/o stenosis.   Hyperlipidemia    Hypertension    MI (myocardial infarction) (HCC) 08/16/2007   Ulcer    Past Surgical History:  Procedure Laterality Date   CARDIAC CATHETERIZATION  2005, 2009   CARDIOVERSION N/A 02/23/2021   Procedure: CARDIOVERSION;  Surgeon: Devorah Fonder, MD;  Location: [facility] ORS;  Service: Cardiovascular;  Laterality: N/A;   CHOLECYSTECTOMY  2001   CORONARY ANGIOPLASTY  2005   s/p stent placement @ [facility]   CORONARY ARTERY BYPASS GRAFT  2009   [facility]   LEFT HEART CATH AND CORS/GRAFTS ANGIOGRAPHY Left 07/07/2021   Procedure: LEFT HEART CATH AND CORS/GRAFTS ANGIOGRAPHY;  Surgeon: Wenona Hamilton, MD;  Location: [facility] INVASIVE CV LAB;  Service: Cardiovascular;  Laterality: Left;   TONSILLECTOMY AND ADENOIDECTOMY  1942    Allergies  Allergies  Allergen Reactions   Plavix  [Clopidogrel  Bisulfate] Rash       History of Present Illness      89 y.o. y/o male with a history of CAD, hypertension, hyperlipidemia, and atrial flutter.  He is  status post prior PCI followed by CABG in 2008 at [facility].  He had a negative Myoview  in March 2014.  Due to recurrent symptoms, he underwent diagnostic catheterization November 2022 revealing severe multi vessel native CAD including an occluded mid to distal LAD stent.  The LIMA to the LAD, vein graft to the OM1, and vein graft to the RPDA were widely patent.  He was medically managed.   Mr. [name] was last seen in cardiology clinic in February 2025 at which time he reported intermittent exertional chest discomfort.  Isosorbide  was increased to 30 mg twice daily.  Stress testing was undertaken and showed no significant ischemia or scar.  Unfortunately, Mr. [name] has continued to have exertional angina several times per week, lasting about 5 minutes, resolving with rest.  He has not taken nitrates.  He questions whether or not he might be having short paroxysms of atrial fibrillation to contribute to symptoms.  He has sometimes seen resting heart rates above 100, though this is not typical and symptoms occur in the absence of palpitations or known tachycardia.  Symptoms are also similar to prior anginal equivalents.  He denies PND, orthopnea, dizziness, syncope, edema, or early satiety.  He is interested in pursuing diagnostic catheterization. Objective   Home Medications    Prior to Admission medications   Medication Sig Start Date End Date  Taking? Authorizing Provider  acetaminophen  (TYLENOL ) 500 MG tablet Take 1,000 mg by mouth every 6 (six) hours as needed for moderate pain or headache.   Yes [provider]  Alirocumab  (PRALUENT ) 75 MG/ML SOAJ INJECT 75 MG UNDER THE SKIN EVERY 14 DAYS 10/09/23  Yes Kent Pear, MD  apixaban  (ELIQUIS ) 5 MG TABS tablet TAKE 1 TABLET BY MOUTH TWICE A DAY 04/13/23  Yes Gollan, Timothy J, MD  aspirin  EC 81 MG tablet Take 1 tablet (81 mg total) by mouth daily. Begin 1 tab daily on the day that you begin holding eliquis  prior to your heart cath.  Swallow whole.  12/29/23  Yes Florette Hurry, NP  diphenhydrAMINE (BENADRYL) 25 MG tablet Take 25 mg by mouth daily as needed for allergies.   Yes [provider]  fluticasone  (FLONASE ) 50 MCG/ACT nasal spray Place 1 spray into both nostrils daily. 08/01/23  Yes Kent Pear, MD  isosorbide  mononitrate (IMDUR ) 30 MG 24 hr tablet Take 1 tablet (30 mg total) by mouth in the morning and at bedtime. 09/19/23  Yes Gollan, Timothy J, MD  loratadine  (CLARITIN ) 10 MG tablet Take 1 tablet (10 mg total) by mouth daily. 08/01/23  Yes Kent Pear, MD  metoprolol  succinate (TOPROL -XL) 25 MG 24 hr tablet TAKE 1 TABLET BY MOUTH DAILY 09/29/23  Yes Gollan, Timothy J, MD  Multiple Vitamin (MULTI-VITAMIN DAILY PO) Take 1 tablet by mouth daily.   Yes [provider]  nitroGLYCERIN  (NITROSTAT ) 0.4 MG SL tablet Place 1 tablet (0.4 mg total) under the tongue every 5 (five) minutes as needed for chest pain. 09/19/23  Yes Gollan, Timothy J, MD  Polyethyl Glycol-Propyl Glycol (SYSTANE OP) Place 1 drop into both eyes daily.   Yes [provider]  ramipril  (ALTACE ) 5 MG capsule Take 1 capsule (5 mg total) by mouth daily. 03/01/23  Yes Kent Pear, MD  sertraline  (ZOLOFT ) 50 MG tablet TAKE 1/2 TABLETS BY MOUTH FOR 14 DAYS, THEN 1 TABLET DAILY 07/19/23  Yes Kent Pear, MD      Family History    Family History  Problem Relation Age of Onset   Heart attack Brother    Heart disease Brother        CAD   Alcohol abuse Father    Diabetes Sister    Stroke Mother    Alcohol abuse Paternal Grandfather     Social History    Social History   Socioeconomic History   Marital status: Married    Spouse name: Dina Francisco   Number of children: 2   Years of education: 12   Highest education level: Not on file  Occupational History   Occupation: Sport and exercise psychologist    Comment: Retired  Tobacco Use   Smoking status: Former    Current packs/day: 0.00    Average packs/day: 1  pack/day for 20.0 years (20.0 ttl pk-yrs)    Types: Cigarettes    Start date: 08/15/1956    Quit date: 08/15/1976    Years since quitting: 47.4   Smokeless tobacco: Never  Vaping Use   Vaping status: Never Used  Substance and Sexual Activity   Alcohol use: Yes    Alcohol/week: 0.0 standard drinks of alcohol    Comment: Occasional   Drug use: No   Sexual activity: Not on file  Other Topics Concern   Not on file  Social History Narrative   Mr. Stup grew up in [place], [place]. He currently lives in [place]  with his wife, Dina Francisco, of 14 years. He was married previously to his high school sweetheart. They were married for 33 years. He widowed in 1993. They had two son Emeterio Hansen and Javohn). Mr. Friedley sons live locally [place] and [place]). He worked at [employer] and retired from [employer]. He was there for 20 years. He worked in Chiropodist. He enjoys fishing, reading, watching TV on his spare time. He also enjoys walking and visiting friends.   Social Drivers of Corporate investment banker Strain: Low Risk  (08/17/2021)   Overall Financial Resource Strain (CARDIA)    Difficulty of Paying Living Expenses: Not very hard  Recent Concern: Financial Resource Strain - Medium Risk (07/30/2021)   Overall Financial Resource Strain (CARDIA)    Difficulty of Paying Living Expenses: Somewhat hard  Food Insecurity: Not on file  Transportation Needs: Not on file  Physical Activity: Not on file  Stress: Not on file  Social Connections: Not on file  Intimate Partner Violence: Not on file    Review of Systems    +++ c/p and DOE.  No n, v, dizziness, syncope, edema, early satiety, dysuria, dark stools, blood in stools, diarrhea, rash/skin changes, fevers, chills, wt loss/gain.  Otherwise all systems reviewed and negative.   Physical Exam    VS:  BP (!) 128/58 (BP Location: Left Arm, Patient Position: Sitting, Cuff Size: Normal)   Pulse 69   Ht 6' (1.829 m)   Wt  175 lb (79.4 kg)   SpO2 96%   BMI 23.73 kg/m  , BMI Body mass index is 23.73 kg/m.       GEN: Well nourished, well developed, in no acute distress. HEENT: normal. Neck: Supple, no JVD, carotid bruits, or masses. Cardiac: RRR, 2/6 systolic murmur at the upper sternal borders, 3/6 systolic murmur at the left lower sternal border towards the apex.  No rubs or gallops. No clubbing, cyanosis, edema.  Radials 2+/PT 2+ and equal bilaterally.  Respiratory:  Respirations regular and unlabored, clear to auscultation bilaterally. GI: Soft, nontender, nondistended, BS + x 4. MS: no deformity or atrophy. Skin: warm and dry, no rash. Neuro:  Strength and sensation are intact. Psych: Normal affect.  Accessory Clinical Findings    ECG personally reviewed by me today - EKG Interpretation Date/Time:  Friday Dec 29 2023 14:48:21 EDT Ventricular Rate:  69 PR Interval:  224 QRS Duration:  88 QT Interval:  390 QTC Calculation: 417 R Axis:   -12  Text Interpretation: Sinus rhythm with 1st degree A-V block Nonspecific ST abnormality Confirmed by Laneta Pintos [phone]) on 12/29/2023 2:56:58 PM  - no acute changes.  Lab Results  Component Value Date   WBC 7.8 08/31/2022   HGB 11.5 (L) 08/31/2022   HCT 34.0 (L) 08/31/2022   MCV 99.1 08/31/2022   PLT 276.0 08/31/2022   Lab Results  Component Value Date   CREATININE 1.26 08/01/2023   BUN 23 08/01/2023   NA 139 08/01/2023   K 4.6 08/01/2023   CL 106 08/01/2023   CO2 27 08/01/2023   Lab Results  Component Value Date   ALT 8 08/01/2023   AST 13 08/01/2023   ALKPHOS 83 08/01/2023   BILITOT 0.4 08/01/2023   Lab Results  Component Value Date   CHOL 131 08/01/2023   HDL 53.70 08/01/2023   LDLCALC 64 08/01/2023   LDLDIRECT 124.0 04/23/2019   TRIG 64.0 08/01/2023   CHOLHDL 2 08/01/2023    Lab Results  Component Value Date   HGBA1C 6.1 08/01/2023   Lab Results  Component Value Date   TSH 1.43 06/15/2022       Assessment & Plan     1.  Unstable angina/coronary artery disease: Status post CABG in 2008 with patent LIMA to the LAD, vein graft to the OM1, and vein graft to the RPDA on catheterization November 2022.  Beginning in January or February of this year, patient began experiencing progressive exertional substernal chest tightness and dyspnea.  Stress testing was low risk without ischemia or infarct in February.  Unfortunately, he has continued to have exertional chest pain and dyspnea, occurring frequently throughout the week.  Symptoms are similar to prior angina.  He questions whether or not he might be having some atrial fibrillation contributing to symptoms though based on his description, this sounds much more like typical angina.  He is in sinus rhythm today without acute ST or T changes.  We will arrange for diagnostic catheterization next week.  CBC and basic metabolic panel today.  He will begin holding his Eliquis  3 days prior to catheterization and start taking aspirin  81 mg daily at that time.  Continue beta-blocker, nitrate, and Praluent .  Of note, patient intolerant to Plavix  due to history of rash. Informed Consent   Shared Decision Making/Informed Consent The risks [stroke (1 in 1000), death (1 in 1000), kidney failure [usually temporary] (1 in 500), bleeding (1 in 200), allergic reaction [possibly serious] (1 in 200)], benefits (diagnostic support and management of coronary artery disease) and alternatives of a cardiac catheterization were discussed in detail with Mr. Banducci and he is willing to proceed.     2.  Paroxysmal atrial flutter: Maintaining sinus rhythm on beta-blocker therapy.  Patient question whether or not symptoms could be related to recurrences of atrial flutter.  Based on his description, it seems unlikely as symptoms only occur with exertion, last the period of time during exertion, and resolved with rest, which are much more in line with angina.  We did discuss that if there are no findings on  diagnostic catheterization, pursuit of event monitoring and potential further titration of beta-blocker therapy would be appropriate.  I considered titrating beta-blocker today however, he noted that he stopped taking ramipril  due to low blood pressures relatively recently.  He is anticoagulated with Eliquis  which will be held for his catheterization.  3.  Primary hypertension: Stable at 128/58 on beta-blocker long-acting nitrate therapy.  He indicates that he is no longer taking ramipril .  4.  Hyperlipidemia: On Praluent  with an LDL of 64 in December 2024.  5.  Systolic murmur: Most recent echo in June 2022 with mild MR, mild to moderate TR, and aortic sclerosis without stenosis.  Will look to repeat echocardiogram post catheterization.  6.  Disposition: Follow-up CBC and basic metabolic panel today.  Plan for diagnostic catheterization next week.  Follow-up in clinic approximate 2 weeks post catheterization.  Laneta Pintos, NP 12/29/2023, 5:41 PM

## 2023-12-30 LAB — CBC
Hematocrit: 26.4 % — ABNORMAL LOW (ref 37.5–51.0)
Hemoglobin: 8.1 g/dL — ABNORMAL LOW (ref 13.0–17.7)
MCH: 28.1 pg (ref 26.6–33.0)
MCHC: 30.7 g/dL — ABNORMAL LOW (ref 31.5–35.7)
MCV: 92 fL (ref 79–97)
Platelets: 314 10*3/uL (ref 150–450)
RBC: 2.88 x10E6/uL — ABNORMAL LOW (ref 4.14–5.80)
RDW: 14 % (ref 11.6–15.4)
WBC: 8.6 10*3/uL (ref 3.4–10.8)

## 2023-12-30 LAB — BASIC METABOLIC PANEL WITH GFR
BUN/Creatinine Ratio: 19 (ref 10–24)
BUN: 27 mg/dL (ref 8–27)
CO2: 20 mmol/L (ref 20–29)
Calcium: 8.7 mg/dL (ref 8.6–10.2)
Chloride: 105 mmol/L (ref 96–106)
Creatinine, Ser: 1.4 mg/dL — ABNORMAL HIGH (ref 0.76–1.27)
Glucose: 87 mg/dL (ref 70–99)
Potassium: 4.8 mmol/L (ref 3.5–5.2)
Sodium: 138 mmol/L (ref 134–144)
eGFR: 49 mL/min/{1.73_m2} — ABNORMAL LOW (ref >59)

## 2024-01-01 ENCOUNTER — Ambulatory Visit: Payer: Self-pay | Admitting: Nurse Practitioner

## 2024-01-01 ENCOUNTER — Telehealth: Payer: Self-pay

## 2024-01-01 ENCOUNTER — Other Ambulatory Visit: Payer: Self-pay | Admitting: Family

## 2024-01-01 ENCOUNTER — Telehealth: Payer: Self-pay | Admitting: Family

## 2024-01-01 DIAGNOSIS — R7303 Prediabetes: Secondary | ICD-10-CM

## 2024-01-01 DIAGNOSIS — D649 Anemia, unspecified: Secondary | ICD-10-CM

## 2024-01-01 NOTE — Telephone Encounter (Signed)
 Patient made aware of results and verbalized understanding. Cath has been canceled. The patient will call his PCP to set up an appointment as soon as possible.

## 2024-01-01 NOTE — Telephone Encounter (Signed)
 Copied from CRM [id number]. Topic: Appointments - Scheduling Inquiry for Clinic >> Jan 01, 2024 10:56 AM Magdalene [employer] wrote: Reason for CRM: Patient is calling following a recent visit with his cardiologist on Friday, 12/29/23, due to experiencing chest pain and shortness of breath. The cardiologist recommended a cardiac catheterization; however, after reviewing his lab results, they informed him that several values were too low to proceed with the procedure. He was advised to follow up with his primary care provider to address and improve his lab results before the catheterization can be scheduled. The patient is requesting an earlier appointment than his currently scheduled transfer of care visit on 03/28/24, as he feels his condition is urgent and cannot wait that long.

## 2024-01-01 NOTE — Telephone Encounter (Signed)
 Spoke to pt  scheduled him  for labs and ov this week

## 2024-01-01 NOTE — Telephone Encounter (Signed)
 Spoke to pt and scheduled him for appt with Daivd Dub and to get labs and urine done as well

## 2024-01-01 NOTE — Telephone Encounter (Signed)
 Call pt  Consulted with cardiology service and concerned for sudden anemia  Please sch visit with me or colleague this week for further evaluation. He will need labs,  urine.

## 2024-01-01 NOTE — Telephone Encounter (Signed)
-----   Message from Laneta Pintos sent at 01/01/2024 10:33 AM EDT ----- New anemia noted with drop in hemoglobin and hematocrit from 11.5 and 34.0 last year to 8.1/26.4 this year.  This is a fairly significant drop and can certainly be contributing to symptoms of chest pain and shortness of breath, though does not require a blood transfusion at this point.  In the setting of new anemia, it is not prudent to perform heart cath until anemia has been more fully worked up and understood.  Please cancel cath.  I have reached out to Bascom Bossier, his PCP.  Rodney Delacruz should schedule a follow-up appt with primary care as soon as feasible.  He will require additional lab work at their discretion.

## 2024-01-02 ENCOUNTER — Other Ambulatory Visit (INDEPENDENT_AMBULATORY_CARE_PROVIDER_SITE_OTHER)

## 2024-01-02 DIAGNOSIS — D649 Anemia, unspecified: Secondary | ICD-10-CM | POA: Diagnosis not present

## 2024-01-02 DIAGNOSIS — R7303 Prediabetes: Secondary | ICD-10-CM | POA: Diagnosis not present

## 2024-01-02 DIAGNOSIS — R8290 Unspecified abnormal findings in urine: Secondary | ICD-10-CM | POA: Diagnosis not present

## 2024-01-02 LAB — COMPREHENSIVE METABOLIC PANEL WITH GFR
ALT: 7 U/L (ref 0–53)
AST: 14 U/L (ref 0–37)
Albumin: 4.1 g/dL (ref 3.5–5.2)
Alkaline Phosphatase: 71 U/L (ref 39–117)
BUN: 27 mg/dL — ABNORMAL HIGH (ref 6–23)
CO2: 24 meq/L (ref 19–32)
Calcium: 8.9 mg/dL (ref 8.4–10.5)
Chloride: 106 meq/L (ref 96–112)
Creatinine, Ser: 1.33 mg/dL (ref 0.40–1.50)
GFR: 47.93 mL/min — ABNORMAL LOW (ref >60.00)
Glucose, Bld: 103 mg/dL — ABNORMAL HIGH (ref 70–99)
Potassium: 4.7 meq/L (ref 3.5–5.1)
Sodium: 139 meq/L (ref 135–145)
Total Bilirubin: 0.4 mg/dL (ref 0.2–1.2)
Total Protein: 6.7 g/dL (ref 6.0–8.3)

## 2024-01-02 LAB — HEMOGLOBIN A1C: Hgb A1c MFr Bld: 6.2 % (ref 4.6–6.5)

## 2024-01-02 LAB — CBC WITH DIFFERENTIAL/PLATELET
Basophils Absolute: 0.1 10*3/uL (ref 0.0–0.1)
Basophils Relative: 1.1 % (ref 0.0–3.0)
Eosinophils Absolute: 0.7 10*3/uL (ref 0.0–0.7)
Eosinophils Relative: 9.5 % — ABNORMAL HIGH (ref 0.0–5.0)
HCT: 27.1 % — ABNORMAL LOW (ref 39.0–52.0)
Hemoglobin: 8.7 g/dL — ABNORMAL LOW (ref 13.0–17.0)
Lymphocytes Relative: 20.3 % (ref 12.0–46.0)
Lymphs Abs: 1.5 10*3/uL (ref 0.7–4.0)
MCHC: 32 g/dL (ref 30.0–36.0)
MCV: 86.1 fl (ref 78.0–100.0)
Monocytes Absolute: 0.6 10*3/uL (ref 0.1–1.0)
Monocytes Relative: 7.5 % (ref 3.0–12.0)
Neutro Abs: 4.5 10*3/uL (ref 1.4–7.7)
Neutrophils Relative %: 61.6 % (ref 43.0–77.0)
Platelets: 332 10*3/uL (ref 150.0–400.0)
RBC: 3.15 Mil/uL — ABNORMAL LOW (ref 4.22–5.81)
RDW: 15.6 % — ABNORMAL HIGH (ref 11.5–15.5)
WBC: 7.3 10*3/uL (ref 4.0–10.5)

## 2024-01-02 LAB — IBC + FERRITIN
Ferritin: 10.2 ng/mL — ABNORMAL LOW (ref 22.0–322.0)
Iron: 36 ug/dL — ABNORMAL LOW (ref 42–165)
Saturation Ratios: 8.8 % — ABNORMAL LOW (ref 20.0–50.0)
TIBC: 408.8 ug/dL (ref 250.0–450.0)
Transferrin: 292 mg/dL (ref 212.0–360.0)

## 2024-01-02 LAB — B12 AND FOLATE PANEL
Folate: >23.2 ng/mL (ref >5.9)
Vitamin B-12: 163 pg/mL — ABNORMAL LOW (ref 211–911)

## 2024-01-02 NOTE — Addendum Note (Signed)
 Addended by: Thressa Flora D on: 01/02/2024 08:45 AM   Modules accepted: Orders

## 2024-01-03 LAB — NO CULTURE INDICATED

## 2024-01-03 LAB — URINALYSIS W MICROSCOPIC + REFLEX CULTURE
Bacteria, UA: NONE SEEN /HPF
Bilirubin Urine: NEGATIVE
Glucose, UA: NEGATIVE
Hgb urine dipstick: NEGATIVE
Ketones, ur: NEGATIVE
Leukocyte Esterase: NEGATIVE
Nitrites, Initial: NEGATIVE
Protein, ur: NEGATIVE
RBC / HPF: NONE SEEN /HPF (ref 0–2)
Specific Gravity, Urine: 1.013 (ref 1.001–1.035)
Squamous Epithelial / HPF: NONE SEEN /HPF (ref <=5)
WBC, UA: NONE SEEN /HPF (ref 0–5)
pH: <=5 — AB (ref 5.0–8.0)

## 2024-01-03 LAB — URINE CULTURE
MICRO NUMBER:: 16479181
Result:: NO GROWTH
SPECIMEN QUALITY:: ADEQUATE

## 2024-01-04 ENCOUNTER — Ambulatory Visit (INDEPENDENT_AMBULATORY_CARE_PROVIDER_SITE_OTHER): Admitting: Family

## 2024-01-04 ENCOUNTER — Ambulatory Visit: Payer: Self-pay | Admitting: Family

## 2024-01-04 ENCOUNTER — Encounter: Payer: Self-pay | Admitting: Family

## 2024-01-04 VITALS — BP 148/82 | HR 67 | Temp 97.9°F | Ht 72.0 in | Wt 175.0 lb

## 2024-01-04 DIAGNOSIS — F32A Depression, unspecified: Secondary | ICD-10-CM | POA: Diagnosis not present

## 2024-01-04 DIAGNOSIS — D649 Anemia, unspecified: Secondary | ICD-10-CM | POA: Diagnosis not present

## 2024-01-04 DIAGNOSIS — I25118 Atherosclerotic heart disease of native coronary artery with other forms of angina pectoris: Secondary | ICD-10-CM

## 2024-01-04 DIAGNOSIS — R5383 Other fatigue: Secondary | ICD-10-CM | POA: Diagnosis not present

## 2024-01-04 DIAGNOSIS — I10 Essential (primary) hypertension: Secondary | ICD-10-CM | POA: Diagnosis not present

## 2024-01-04 DIAGNOSIS — F419 Anxiety disorder, unspecified: Secondary | ICD-10-CM | POA: Diagnosis not present

## 2024-01-04 MED ORDER — FERROUS SULFATE 325 (65 FE) MG PO TBEC: 325.0000 mg | DELAYED_RELEASE_TABLET | Freq: Two times a day (BID) | ORAL | 2 refills | Status: AC

## 2024-01-04 MED ORDER — B-12 1000 MCG PO CAPS: 1.0000 | ORAL_CAPSULE | Freq: Every day | ORAL | 5 refills | Status: AC

## 2024-01-04 NOTE — Assessment & Plan Note (Signed)
 Discussed concern regarding SOB with activity and exertional CP ( he describes as chest pressure).  Will collaborate with Odessa Bene in regards to when appropriate to reschedule cardiac cath.

## 2024-01-04 NOTE — Assessment & Plan Note (Signed)
 Differential includes anemia, CAD. We also discussed OSA. Will discuss sleep study if symptom does not improve.

## 2024-01-04 NOTE — Assessment & Plan Note (Addendum)
 Hemoglobin improved from prior. Despite normocytic, ferritin and b12 low and I question if IDA.   Start b12, ferrous sulfate . Pending repeat labs in 2 weeks to monitor.  He declines GI referral at this time and would like to start ferrous sulfate , b12 supplementation first.

## 2024-01-04 NOTE — Assessment & Plan Note (Signed)
 Elevated today. BP at home under excellent control.  Continue toprol  25mg  , imbur 30mg  every day. He suspended ramipril  5mg  d/t low end blood pressures. Will monitor.

## 2024-01-04 NOTE — Assessment & Plan Note (Signed)
Chronic, stable Continue zoloft 50 mg  

## 2024-01-04 NOTE — Telephone Encounter (Signed)
 Rodney Delacruz,  I saw [name] today Hgb is slightly improved from prior 8.7 from 8.1.   Ferritin is quite low, as well as b12  Starting him on b12, ferrous sulfate .   Repeating hgb in 2 weeks to trend. If improved, at what point would it be appropriate to reschedule cardiac cath?

## 2024-01-04 NOTE — Patient Instructions (Signed)
 I sent a prescription for a two-month course of iron to see this improves symptoms and low iron stores. Please take your iron with vitamin C for better absorption.   Start b12 vitamin daily.   As discussed, I suspect your anemia is nutritional. Please let me know how you are doing   Labs and urine in 2 weeks

## 2024-01-04 NOTE — Progress Notes (Signed)
 Assessment & Plan:  Anemia, unspecified type Assessment & Plan: Hemoglobin improved from prior. Despite normocytic, ferritin and b12 low and I question if IDA.   Start b12, ferrous sulfate . Pending repeat labs in 2 weeks to monitor.  He declines GI referral at this time and would like to start ferrous sulfate , b12 supplementation first.    Orders: -     CBC with Differential/Platelet; Future -     Ferrous Sulfate ; Take 1 tablet (325 mg total) by mouth 2 (two) times daily with a meal.  Dispense: 60 tablet; Refill: 2 -     Iron, TIBC and Ferritin Panel; Future -     B12 and Folate Panel; Future -     B-12; Take 1 capsule by mouth daily.  Dispense: 30 capsule; Refill: 5  Essential hypertension Assessment & Plan: Elevated today. BP at home under excellent control.  Continue toprol  25mg  , imbur 30mg  every day. He suspended ramipril  5mg  d/t low end blood pressures. Will monitor.   Orders: -     Microalbumin / creatinine urine ratio; Future  Normocytic anemia Assessment & Plan: Hemoglobin improved from prior. Despite normocytic, ferritin and b12 low and I question if IDA.   Start b12, ferrous sulfate . Pending repeat labs in 2 weeks to monitor.  He declines GI referral at this time and would like to start ferrous sulfate , b12 supplementation first.     Coronary artery disease of native artery of native heart with stable angina pectoris [facility]) Assessment & Plan: Discussed concern regarding SOB with activity and exertional CP ( he describes as chest pressure).  Will collaborate with Odessa Bene in regards to when appropriate to reschedule cardiac cath.    Other fatigue Assessment & Plan: Differential includes anemia, CAD. We also discussed OSA. Will discuss sleep study if symptom does not improve.   Orders: -     TSH; Future  Anxiety and depression Assessment & Plan: Chronic, stable. Continue zoloft  50mg        Return precautions given.   Risks, benefits, and alternatives  of the medications and treatment plan prescribed today were discussed, and patient expressed understanding.   Education regarding symptom management and diagnosis given to patient on AVS either electronically or printed.  Return in about 6 months (around 07/06/2024).  Bascom Bossier, FNP  Subjective:    Patient ID: Rodney Delacruz, male    DOB: [birth year], 89 y.o.   MRN: [id number]  CC: Rodney Delacruz is a 89 y.o. male who presents today for an acute visit.    HPI: Complains of SOB and fatigue 'with a minor' tasks x one month.  Endorses daily fatigue  He has had episodic chest 'pressure'  with activity such as walking to the  mail box.   Chest pressure is not r/t eating. He takes omeprazole  otc with good control of symptoms   Denies fever, chills, cough, leg swelling, abdominal pain, constipation, rectal bleeding, urinary hesitancy.   Sleeping well 8-9 hours. Sleep is not restorative.      He is not walking like he used too due to SOB.   Compliant with Eliquis  He is intolerant to plavix   Consult cardiology 12/29/2023 , Odessa Bene for intermittent exertional chest comfort, dyspnea. Ordered diag catheterization 01/10/24 . Canceled per cardiology d/t anemia.  H/o CAD s/p PCI, bypass 2008  No NSAID use  He is not vegetarian    Stress test low risk 09/2023  B12 163 Hemoglobin 8.7 ( prior 8.1-> 11.5 08/31/22)  MCV  86 ( prior 92)  A1c 6.2 Ferritin 10 ABS retic normal range Negative RBC UA Reported colonoscopy 12 years ago.  No report in chart. He denies polyps, hemorrhoids.   BP at home 110/56.   He is compliant with toprol  25mg  , imbur 30mg  every day;  he suspended ramipril  5mg   He is doing well on zoloft  50mg  and feel well on current regimen.   No longer following annual PSA after 82 yrs; he no longer follows with Dr Bud Care  Former smoker   Allergies: Plavix  [clopidogrel  bisulfate] Current Outpatient Medications on File Prior to Visit  Medication Sig Dispense  Refill   acetaminophen  (TYLENOL ) 500 MG tablet Take 1,000 mg by mouth every 6 (six) hours as needed for moderate pain or headache.     Alirocumab  (PRALUENT ) 75 MG/ML SOAJ INJECT 75 MG UNDER THE SKIN EVERY 14 DAYS 6 mL 1   apixaban  (ELIQUIS ) 5 MG TABS tablet TAKE 1 TABLET BY MOUTH TWICE A DAY 180 tablet 1   aspirin  EC 81 MG tablet Take 1 tablet (81 mg total) by mouth daily. Begin 1 tab daily on the day that you begin holding eliquis  prior to your heart cath.  Swallow whole.     diphenhydrAMINE (BENADRYL) 25 MG tablet Take 25 mg by mouth daily as needed for allergies.     fluticasone  (FLONASE ) 50 MCG/ACT nasal spray Place 1 spray into both nostrils daily. 16 mL 2   isosorbide  mononitrate (IMDUR ) 30 MG 24 hr tablet Take 1 tablet (30 mg total) by mouth in the morning and at bedtime. 180 tablet 3   loratadine  (CLARITIN ) 10 MG tablet Take 1 tablet (10 mg total) by mouth daily. 30 tablet 2   metoprolol  succinate (TOPROL -XL) 25 MG 24 hr tablet TAKE 1 TABLET BY MOUTH DAILY 90 tablet 3   Multiple Vitamin (MULTI-VITAMIN DAILY PO) Take 1 tablet by mouth daily.     nitroGLYCERIN  (NITROSTAT ) 0.4 MG SL tablet Place 1 tablet (0.4 mg total) under the tongue every 5 (five) minutes as needed for chest pain. 25 tablet 3   omeprazole  (PRILOSEC OTC) 20 MG tablet Take 20 mg by mouth daily.     Polyethyl Glycol-Propyl Glycol (SYSTANE OP) Place 1 drop into both eyes daily.     ramipril  (ALTACE ) 5 MG capsule Take 1 capsule (5 mg total) by mouth daily. 90 capsule 3   sertraline  (ZOLOFT ) 50 MG tablet TAKE 1/2 TABLETS BY MOUTH FOR 14 DAYS, THEN 1 TABLET DAILY 90 tablet 1   No current facility-administered medications on file prior to visit.    Review of Systems  Constitutional:  Positive for fatigue. Negative for chills and fever.  Respiratory:  Positive for shortness of breath. Negative for cough.   Cardiovascular:  Positive for chest pain. Negative for palpitations and leg swelling.  Gastrointestinal:  Negative for  nausea and vomiting.      Objective:    BP (!) 148/82   Pulse 67   Temp 97.9 F (36.6 C)   Ht 6' (1.829 m)   Wt 175 lb (79.4 kg)   SpO2 97%   BMI 23.73 kg/m   BP Readings from Last 3 Encounters:  01/04/24 (!) 148/82  12/29/23 (!) 128/58  09/19/23 (!) 140/54   Wt Readings from Last 3 Encounters:  01/04/24 175 lb (79.4 kg)  12/29/23 175 lb (79.4 kg)  09/19/23 176 lb 6 oz (80 kg)    Physical Exam Vitals reviewed.  Constitutional:      Appearance: He is  well-developed.  Cardiovascular:     Rate and Rhythm: Regular rhythm.     Heart sounds: Normal heart sounds.  Pulmonary:     Effort: Pulmonary effort is normal. No respiratory distress.     Breath sounds: Normal breath sounds. No wheezing, rhonchi or rales.  Skin:    General: Skin is warm and dry.  Neurological:     Mental Status: He is alert.  Psychiatric:        Speech: Speech normal.        Behavior: Behavior normal.

## 2024-01-05 LAB — MULTIPLE MYELOMA PANEL, SERUM
Albumin SerPl Elph-Mcnc: 3.2 g/dL (ref 2.9–4.4)
Albumin/Glob SerPl: 1.1 (ref 0.7–1.7)
Alpha 1: 0.3 g/dL (ref 0.0–0.4)
Alpha2 Glob SerPl Elph-Mcnc: 0.9 g/dL (ref 0.4–1.0)
B-Globulin SerPl Elph-Mcnc: 1.1 g/dL (ref 0.7–1.3)
Gamma Glob SerPl Elph-Mcnc: 1 g/dL (ref 0.4–1.8)
Globulin, Total: 3.2 g/dL (ref 2.2–3.9)
IgA/Immunoglobulin A, Serum: 194 mg/dL (ref 61–437)
IgG (Immunoglobin G), Serum: 1034 mg/dL (ref 603–1613)
IgM (Immunoglobulin M), Srm: 22 mg/dL (ref 15–143)
Total Protein: 6.4 g/dL (ref 6.0–8.5)

## 2024-01-05 LAB — METHYLMALONIC ACID, SERUM: Methylmalonic Acid, Quant: 475 nmol/L — ABNORMAL HIGH (ref 85–423)

## 2024-01-05 LAB — RETICULOCYTES
ABS Retic: 37440 {cells}/uL (ref 25000–90000)
Retic Ct Pct: 1.2 %

## 2024-01-05 LAB — ERYTHROPOIETIN: Erythropoietin: 25.3 m[IU]/mL — ABNORMAL HIGH (ref 2.6–18.5)

## 2024-01-09 ENCOUNTER — Other Ambulatory Visit: Payer: Self-pay | Admitting: Cardiovascular Disease

## 2024-01-09 DIAGNOSIS — I483 Typical atrial flutter: Secondary | ICD-10-CM

## 2024-01-10 ENCOUNTER — Ambulatory Visit: Admit: 2024-01-10 | Admitting: Cardiovascular Disease

## 2024-01-10 DIAGNOSIS — I200 Unstable angina: Secondary | ICD-10-CM

## 2024-01-10 SURGERY — LEFT HEART CATH AND CORS/GRAFTS ANGIOGRAPHY
Anesthesia: Moderate Sedation | Laterality: Left

## 2024-01-10 NOTE — Telephone Encounter (Signed)
 Prescription refill request for Eliquis  received. Indication: a flutter Last office visit: 12/29/23 Scr: 1.33 epic 01/02/24 Age: 89 Weight: 79kg

## 2024-01-18 ENCOUNTER — Other Ambulatory Visit (INDEPENDENT_AMBULATORY_CARE_PROVIDER_SITE_OTHER)

## 2024-01-18 DIAGNOSIS — I10 Essential (primary) hypertension: Secondary | ICD-10-CM

## 2024-01-18 DIAGNOSIS — D649 Anemia, unspecified: Secondary | ICD-10-CM

## 2024-01-18 DIAGNOSIS — R5383 Other fatigue: Secondary | ICD-10-CM | POA: Diagnosis not present

## 2024-01-18 LAB — CBC WITH DIFFERENTIAL/PLATELET
Basophils Absolute: 0.1 10*3/uL (ref 0.0–0.1)
Basophils Relative: 1 % (ref 0.0–3.0)
Eosinophils Absolute: 0.5 10*3/uL (ref 0.0–0.7)
Eosinophils Relative: 7.9 % — ABNORMAL HIGH (ref 0.0–5.0)
HCT: 28.5 % — ABNORMAL LOW (ref 39.0–52.0)
Hemoglobin: 9 g/dL — ABNORMAL LOW (ref 13.0–17.0)
Lymphocytes Relative: 16.8 % (ref 12.0–46.0)
Lymphs Abs: 1.1 10*3/uL (ref 0.7–4.0)
MCHC: 31.7 g/dL (ref 30.0–36.0)
MCV: 90.5 fl (ref 78.0–100.0)
Monocytes Absolute: 0.4 10*3/uL (ref 0.1–1.0)
Monocytes Relative: 6.5 % (ref 3.0–12.0)
Neutro Abs: 4.6 10*3/uL (ref 1.4–7.7)
Neutrophils Relative %: 67.8 % (ref 43.0–77.0)
Platelets: 298 10*3/uL (ref 150.0–400.0)
RBC: 3.15 Mil/uL — ABNORMAL LOW (ref 4.22–5.81)
RDW: 20.5 % — ABNORMAL HIGH (ref 11.5–15.5)
WBC: 6.7 10*3/uL (ref 4.0–10.5)

## 2024-01-18 LAB — B12 AND FOLATE PANEL
Folate: >23.2 ng/mL (ref >5.9)
Vitamin B-12: 646 pg/mL (ref 211–911)

## 2024-01-18 LAB — TSH: TSH: 1.96 u[IU]/mL (ref 0.35–5.50)

## 2024-01-19 LAB — MICROALBUMIN / CREATININE URINE RATIO
Creatinine,U: 132.6 mg/dL
Microalb Creat Ratio: 22.7 mg/g (ref 0.0–30.0)
Microalb, Ur: 3 mg/dL — ABNORMAL HIGH (ref 0.0–1.9)

## 2024-01-19 LAB — IRON,TIBC AND FERRITIN PANEL
%SAT: 37 % (ref 20–48)
Ferritin: 15 ng/mL — ABNORMAL LOW (ref 24–380)
Iron: 118 ug/dL (ref 50–180)
TIBC: 322 ug/dL (ref 250–425)

## 2024-01-22 ENCOUNTER — Ambulatory Visit: Payer: Self-pay | Admitting: Family

## 2024-01-22 DIAGNOSIS — D649 Anemia, unspecified: Secondary | ICD-10-CM

## 2024-01-22 DIAGNOSIS — J309 Allergic rhinitis, unspecified: Secondary | ICD-10-CM

## 2024-01-24 ENCOUNTER — Ambulatory Visit: Attending: Nurse Practitioner | Admitting: Nurse Practitioner

## 2024-01-24 ENCOUNTER — Encounter: Payer: Self-pay | Admitting: Nurse Practitioner

## 2024-01-24 VITALS — BP 137/50 | Ht 72.0 in | Wt 175.1 lb

## 2024-01-24 DIAGNOSIS — I25118 Atherosclerotic heart disease of native coronary artery with other forms of angina pectoris: Secondary | ICD-10-CM | POA: Insufficient documentation

## 2024-01-24 DIAGNOSIS — I10 Essential (primary) hypertension: Secondary | ICD-10-CM | POA: Diagnosis not present

## 2024-01-24 DIAGNOSIS — D649 Anemia, unspecified: Secondary | ICD-10-CM | POA: Diagnosis not present

## 2024-01-24 DIAGNOSIS — R011 Cardiac murmur, unspecified: Secondary | ICD-10-CM | POA: Insufficient documentation

## 2024-01-24 DIAGNOSIS — E782 Mixed hyperlipidemia: Secondary | ICD-10-CM | POA: Diagnosis not present

## 2024-01-24 DIAGNOSIS — I483 Typical atrial flutter: Secondary | ICD-10-CM | POA: Diagnosis not present

## 2024-01-24 NOTE — Patient Instructions (Signed)
 Medication Instructions:  Your physician recommends that you continue on your current medications as directed. Please refer to the Current Medication list given to you today.   *If you need a refill on your cardiac medications before your next appointment, please call your pharmacy*  Lab Work: None ordered at this time   Follow-Up: At [facility], you and your health needs are our priority.  As part of our continuing mission to provide you with exceptional heart care, our providers are all part of one team.  This team includes your primary Cardiologist (physician) and Advanced Practice Providers or APPs (Physician Assistants and Nurse Practitioners) who all work together to provide you with the care you need, when you need it.  Your next appointment:   3 month(s)  Provider:   Timothy Gollan, MD

## 2024-01-24 NOTE — Progress Notes (Signed)
 Office Visit    Patient Name: Rodney Delacruz Date of Encounter: 01/24/2024  Primary Care Provider:  Calista Catching, FNP Primary Cardiologist:  Belva Boyden, MD  Chief Complaint    89 y.o. male with a history of CAD status post prior PCI and bypass surgery in 2008 at [facility], hypertension, hyperlipidemia, and atrial flutter, who presents for follow-up of unstable angina.   Past Medical History   Subjective   Past Medical History:  Diagnosis Date   Allergy    Anxiety    Coronary artery disease    a. s/p remote LAD PCI; b. 2008 s/p CABG; c. 06/2021 Cath: LM 142m/d, LAD 60p/m, 168m/d ISR, LCX mild diff dzs, RCA 80ost/p, 50p/m, 66m/d, VG->OM1 nl, VG->RPDA nl, LIMA->dLAD nl-->Med rx; c. 09/2023 MV: No isch/infarct.   Depression    Elevated PSA    Followed by Dr. Bud Care every 6 month   Heart murmur    a. 01/2021 Echo: EF 60-65%, no rwma, nl RV fxn, nl RVSP, mildly dil LA, mild MR, mild-mod TR, mild-mod AoV sclerosis w/o stenosis.   Hyperlipidemia    Hypertension    MI (myocardial infarction) (HCC) 08/16/2007   Ulcer    Past Surgical History:  Procedure Laterality Date   CARDIAC CATHETERIZATION  2005, 2009   CARDIOVERSION N/A 02/23/2021   Procedure: CARDIOVERSION;  Surgeon: Devorah Fonder, MD;  Location: [facility] ORS;  Service: Cardiovascular;  Laterality: N/A;   CHOLECYSTECTOMY  2001   CORONARY ANGIOPLASTY  2005   s/p stent placement @ [facility]   CORONARY ARTERY BYPASS GRAFT  2009   [facility]   LEFT HEART CATH AND CORS/GRAFTS ANGIOGRAPHY Left 07/07/2021   Procedure: LEFT HEART CATH AND CORS/GRAFTS ANGIOGRAPHY;  Surgeon: Wenona Hamilton, MD;  Location: [facility] INVASIVE CV LAB;  Service: Cardiovascular;  Laterality: Left;   TONSILLECTOMY AND ADENOIDECTOMY  1942    Allergies  Allergies  Allergen Reactions   Plavix  [Clopidogrel  Bisulfate] Rash       History of Present Illness      89 y.o. y/o male with a history of CAD, hypertension, hyperlipidemia, and atrial flutter.  He is  status post prior PCI followed by CABG in 2008 at [facility].  He had a negative Myoview  in March 2014.  Due to recurrent symptoms, he underwent diagnostic catheterization November 2022 revealing severe multi vessel native CAD including an occluded mid to distal LAD stent.  The LIMA to the LAD, vein graft to the OM1, and vein graft to the RPDA were widely patent.  He was medically managed.   Mr. Heikkila began complaining of intermittent exertional chest discomfort in February 2025.  He was placed on long-acting nitrate therapy and stress testing was undertaken and showed no significant ischemia or scar.  He continued to have exertional angina several times per week and in May 2025, decision was made to pursue diagnostic catheterization.  However, upon review of preprocedure labs, patient was noted to be markedly anemic with an H&H of 8.1/26.4 (normocytic), down from 11.5/34.0.  Catheterization was canceled and he was seen by primary care.  Iron studies were carried out and iron was low at 36 with a ferritin of 10.2 B12 was also 163.  He was placed on ferrous sulfate  and B12 supplementation and then eventually declined a GI referral.   Mr. Ikard last had lab work on January 18, 2024 showing slight improvement in H&H to 9.0 and 28.5.  Today, Mr. Astorino reports that he has had improvement in his exertional  chest discomfort and dyspnea.  He still has some symptoms every day but he has noted improvement in exercise tolerance, now being able to walk to and from his mailbox without having to rest or developing chest discomfort.  Prior to starting iron therapy, he did not have any dark stools or bright red blood in his rectum.  His stools now dark in the context of iron therapy.  He denies palpitations, PND, orthopnea, dizziness, syncope, edema, or early satiety.  He is reconsidering GI evaluation after our discussion today. Objective   Home Medications    Current Outpatient Medications  Medication Sig Dispense Refill    acetaminophen  (TYLENOL ) 500 MG tablet Take 1,000 mg by mouth every 6 (six) hours as needed for moderate pain or headache.     Alirocumab  (PRALUENT ) 75 MG/ML SOAJ INJECT 75 MG UNDER THE SKIN EVERY 14 DAYS 6 mL 1   apixaban  (ELIQUIS ) 5 MG TABS tablet TAKE 1 TABLET BY MOUTH 2 TIMES A DAY 60 tablet 5   Cyanocobalamin  (B-12) 1000 MCG CAPS Take 1 capsule by mouth daily. 30 capsule 5   diphenhydrAMINE (BENADRYL) 25 MG tablet Take 25 mg by mouth daily as needed for allergies.     ferrous sulfate  325 (65 FE) MG EC tablet Take 1 tablet (325 mg total) by mouth 2 (two) times daily with a meal. 60 tablet 2   fluticasone  (FLONASE ) 50 MCG/ACT nasal spray Place 1 spray into both nostrils daily. 16 mL 2   isosorbide  mononitrate (IMDUR ) 30 MG 24 hr tablet Take 1 tablet (30 mg total) by mouth in the morning and at bedtime. 180 tablet 3   loratadine  (CLARITIN ) 10 MG tablet Take 1 tablet (10 mg total) by mouth daily. 30 tablet 2   metoprolol  succinate (TOPROL -XL) 25 MG 24 hr tablet TAKE 1 TABLET BY MOUTH DAILY 90 tablet 3   Multiple Vitamin (MULTI-VITAMIN DAILY PO) Take 1 tablet by mouth daily.     nitroGLYCERIN  (NITROSTAT ) 0.4 MG SL tablet Place 1 tablet (0.4 mg total) under the tongue every 5 (five) minutes as needed for chest pain. 25 tablet 3   omeprazole  (PRILOSEC OTC) 20 MG tablet Take 20 mg by mouth daily.     Polyethyl Glycol-Propyl Glycol (SYSTANE OP) Place 1 drop into both eyes daily.     ramipril  (ALTACE ) 5 MG capsule Take 1 capsule (5 mg total) by mouth daily. 90 capsule 3   sertraline  (ZOLOFT ) 50 MG tablet TAKE 1/2 TABLETS BY MOUTH FOR 14 DAYS, THEN 1 TABLET DAILY 90 tablet 1   aspirin  EC 81 MG tablet Take 1 tablet (81 mg total) by mouth daily. Begin 1 tab daily on the day that you begin holding eliquis  prior to your heart cath.  Swallow whole. (Patient not taking: Reported on 01/24/2024)     No current facility-administered medications for this visit.     Physical Exam    VS:  BP (!) 137/50 (BP  Location: Left Arm, Patient Position: Sitting, Cuff Size: Normal)   Ht 6' (1.829 m)   Wt 175 lb 2 oz (79.4 kg)   SpO2 98%   BMI 23.75 kg/m  , BMI Body mass index is 23.75 kg/m.       GEN: Well nourished, well developed, in no acute distress. HEENT: normal. Neck: Supple, no JVD, carotid bruits, or masses. Cardiac: RRR, 2/6 systolic murmur throughout.  No rubs or gallops.  No clubbing, cyanosis, edema.  Radials 2+/PT 2+ and equal bilaterally.  Respiratory:  Respirations regular and  unlabored, clear to auscultation bilaterally. GI: Soft, nontender, nondistended, BS + x 4. MS: no deformity or atrophy. Skin: warm and dry, no rash. Neuro:  Strength and sensation are intact. Psych: Normal affect.  Accessory Clinical Findings    ECG personally reviewed by me today - EKG Interpretation Date/Time:  Wednesday January 24 2024 09:19:45 EDT Ventricular Rate:  64 PR Interval:  238 QRS Duration:  90 QT Interval:  424 QTC Calculation: 437 R Axis:   -15  Text Interpretation: Sinus rhythm with 1st degree A-V block Confirmed by Laneta Pintos [phone]) on 01/24/2024 9:26:54 AM  - no acute changes.  Lab Results  Component Value Date   WBC 6.7 01/18/2024   HGB 9.0 (L) 01/18/2024   HCT 28.5 (L) 01/18/2024   MCV 90.5 01/18/2024   PLT 298.0 01/18/2024   Lab Results  Component Value Date   CREATININE 1.33 01/02/2024   BUN 27 (H) 01/02/2024   NA 139 01/02/2024   K 4.7 01/02/2024   CL 106 01/02/2024   CO2 24 01/02/2024   Lab Results  Component Value Date   ALT 7 01/02/2024   AST 14 01/02/2024   ALKPHOS 71 01/02/2024   BILITOT 0.4 01/02/2024   Lab Results  Component Value Date   CHOL 131 08/01/2023   HDL 53.70 08/01/2023   LDLCALC 64 08/01/2023   LDLDIRECT 124.0 04/23/2019   TRIG 64.0 08/01/2023   CHOLHDL 2 08/01/2023    Lab Results  Component Value Date   HGBA1C 6.2 01/02/2024   Lab Results  Component Value Date   TSH 1.96 01/18/2024       Assessment & Plan    1.   Stable angina/CAD: Status post CABG in 2008 with patent LIMA to the LAD, vein graft to the OM1, and vein graft to the RPDA on catheterization November 2022.  Earlier this year, Mr. Padin reported exertional chest pain and dyspnea.  Stress testing was low risk.  I saw him in May 2025 with more progressive symptoms and we planned to pursue diagnostic catheterization however, preprocedure labs showed new normocytic anemia within H&H of 8.1 and 26.4.  Catheterization was placed on hold.  He has since follow-up with primary care and has been taking iron and B12 in the setting of deficiencies in both areas.  Most recent hemoglobin hematocrit 9.0 and 28.5.  Today, he reports improvement in activity tolerance.  He still has some chest pressure but not severe pain and dyspnea has improved significantly.  We mutually agreed to continue to monitor his symptoms and treat medically while anemia is being managed.  Only if he continued to have symptoms once anemia significantly improved would we pursue diagnostic catheterization at some point in the future.  I did encourage him to go forward with GI evaluation, which at this time he is deferring.  He remains on low-dose aspirin , beta-blocker, nitrate, and Praluent .  2.  Paroxysmal atrial flutter: Maintaining sinus rhythm on beta-blocker therapy.  No recent palpitations.  Continue Eliquis .  3.  Normocytic anemia: See #1.  Found to be iron deficient and B12 deficient, with supplementation ongoing.  H&H slightly improved to 9.0 and 28.5 earlier this month with improved symptoms.  Continued therapy through primary care.  He will reconsider GI evaluation.  4.  Hyperlipidemia: On Praluent  with an LDL of 64 in December 2024.  5.  Systolic murmur: Echo in June 2022 with mild MR, mild-moderate TR, and aortic sclerosis without stenosis.  6.  Disposition: Will plan to see Mr.  Rieke back in 3 months to reevaluate symptoms.  Laneta Pintos, NP 01/24/2024, 9:27 AM

## 2024-02-19 ENCOUNTER — Other Ambulatory Visit: Payer: Self-pay

## 2024-02-19 DIAGNOSIS — J309 Allergic rhinitis, unspecified: Secondary | ICD-10-CM

## 2024-02-19 NOTE — Telephone Encounter (Signed)
 Sent to PCP ?

## 2024-02-19 NOTE — Telephone Encounter (Signed)
 Last filled 07/31/2024 by Dr. Maribeth  with 2 refills

## 2024-02-20 ENCOUNTER — Other Ambulatory Visit: Payer: Self-pay | Admitting: Family

## 2024-02-20 DIAGNOSIS — J309 Allergic rhinitis, unspecified: Secondary | ICD-10-CM

## 2024-02-20 MED ORDER — FLUTICASONE PROPIONATE 50 MCG/ACT NA SUSP
1.0000 | Freq: Every day | NASAL | 2 refills | Status: AC
Start: 2024-02-20 — End: ?

## 2024-03-13 ENCOUNTER — Other Ambulatory Visit: Payer: Self-pay

## 2024-03-13 DIAGNOSIS — F32A Depression, unspecified: Secondary | ICD-10-CM

## 2024-03-13 MED ORDER — SERTRALINE HCL 50 MG PO TABS: ORAL_TABLET | ORAL | 1 refills | Status: DC

## 2024-03-21 ENCOUNTER — Encounter: Payer: Self-pay | Admitting: Family

## 2024-03-21 DIAGNOSIS — R5383 Other fatigue: Secondary | ICD-10-CM

## 2024-03-21 NOTE — Telephone Encounter (Signed)
 Spoke to pt scheduled lab appt labs ordered

## 2024-03-22 ENCOUNTER — Other Ambulatory Visit: Payer: Self-pay | Admitting: *Deleted

## 2024-03-22 DIAGNOSIS — D649 Anemia, unspecified: Secondary | ICD-10-CM

## 2024-03-22 NOTE — Addendum Note (Signed)
 Addended by: BRIEN SHARENE RAMAN on: 03/22/2024 03:15 PM   Modules accepted: Orders

## 2024-03-25 ENCOUNTER — Other Ambulatory Visit (INDEPENDENT_AMBULATORY_CARE_PROVIDER_SITE_OTHER)

## 2024-03-25 DIAGNOSIS — D649 Anemia, unspecified: Secondary | ICD-10-CM

## 2024-03-25 DIAGNOSIS — R5383 Other fatigue: Secondary | ICD-10-CM | POA: Diagnosis not present

## 2024-03-25 LAB — IBC + FERRITIN
Ferritin: 21.4 ng/mL — ABNORMAL LOW (ref 22.0–322.0)
Iron: 88 ug/dL (ref 42–165)
Saturation Ratios: 27.7 % (ref 20.0–50.0)
TIBC: 317.8 ug/dL (ref 250.0–450.0)
Transferrin: 227 mg/dL (ref 212.0–360.0)

## 2024-03-25 LAB — CBC WITH DIFFERENTIAL/PLATELET
Basophils Absolute: 0.1 K/uL (ref 0.0–0.1)
Basophils Relative: 1.1 % (ref 0.0–3.0)
Eosinophils Absolute: 0.5 K/uL (ref 0.0–0.7)
Eosinophils Relative: 6.4 % — ABNORMAL HIGH (ref 0.0–5.0)
HCT: 34.9 % — ABNORMAL LOW (ref 39.0–52.0)
Hemoglobin: 11.4 g/dL — ABNORMAL LOW (ref 13.0–17.0)
Lymphocytes Relative: 18.2 % (ref 12.0–46.0)
Lymphs Abs: 1.4 K/uL (ref 0.7–4.0)
MCHC: 32.6 g/dL (ref 30.0–36.0)
MCV: 97.4 fl (ref 78.0–100.0)
Monocytes Absolute: 0.4 K/uL (ref 0.1–1.0)
Monocytes Relative: 5.5 % (ref 3.0–12.0)
Neutro Abs: 5.1 K/uL (ref 1.4–7.7)
Neutrophils Relative %: 68.8 % (ref 43.0–77.0)
Platelets: 286 K/uL (ref 150.0–400.0)
RBC: 3.59 Mil/uL — ABNORMAL LOW (ref 4.22–5.81)
RDW: 17.6 % — ABNORMAL HIGH (ref 11.5–15.5)
WBC: 7.5 K/uL (ref 4.0–10.5)

## 2024-03-26 ENCOUNTER — Ambulatory Visit: Payer: Self-pay | Admitting: Family

## 2024-03-28 ENCOUNTER — Encounter: Payer: Self-pay | Admitting: Family

## 2024-03-28 ENCOUNTER — Ambulatory Visit (INDEPENDENT_AMBULATORY_CARE_PROVIDER_SITE_OTHER): Payer: Medicare Other | Admitting: Family

## 2024-03-28 VITALS — BP 130/70 | HR 58 | Temp 98.4°F | Ht 72.0 in | Wt 173.8 lb

## 2024-03-28 DIAGNOSIS — D649 Anemia, unspecified: Secondary | ICD-10-CM

## 2024-03-28 NOTE — Progress Notes (Signed)
 Assessment & Plan:  Normocytic anemia Assessment & Plan: No overt symptoms of GI bleeding.  Discussed longstanding anemia and strongly advised consult with gastroenterology.  He politely declines.  Fatigue, dyspnea improved.  Continue ferrous sulfate  325 mg twice daily encouraged him to take iron supplement with a small glass of orange juice.  Continue B12 supplementation.  Labs in 2 months time.  Orders: -     CBC with Differential/Platelet; Future -     IBC + Ferritin; Future -     B12 and Folate Panel; Future     Return precautions given.   Risks, benefits, and alternatives of the medications and treatment plan prescribed today were discussed, and patient expressed understanding.   Education regarding symptom management and diagnosis given to patient on AVS either electronically or printed.  Return in about 6 months (around 09/28/2024).  Rodney Northern, Rodney Delacruz  Subjective:    Patient ID: Rodney Delacruz, male    DOB: [birth year], 89 y.o.   MRN: [id number]  CC: Rodney Delacruz is a 89 y.o. male who presents today for follow up.   HPI: HPI Discussed the use of AI scribe software for clinical note transcription with the patient, who gave verbal consent to proceed.  History of Present Illness   Rodney Delacruz is an 89 year old male with anemia who presents for follow-up He has been experiencing improved energy levels recently, feeling better than he has in a long time. Previously, he felt lethargic and inactive, but now some of his energy has returned. Denies chest pain is present, and his breathing has improved. He still experiences episodic shortness of breath, but it occurs only during strenuous activities and not during daily tasks like getting dressed or doing small tasks around the house.  He has been taking  1000mcg B12 supplement and ferrous sulfate  325 mg twice a day. His ferritin levels have been increasing  denies any history of ulcers, changes to stool pattern, pencil thin  stools, melena stools, noticeable blood in his urine or stool.  Denies NSAID use  He has not had a colonoscopy in a long time, with the last one being 13 years ago, and he does not recall having any polyps or a family history of colon cancer.   Follow up cardiology 01/24/24 for stable angina /CAD with dyspnea improved ; opted to monitor sxs versus sch diag catheterization  Allergies: Plavix  [clopidogrel  bisulfate] Current Outpatient Medications on File Prior to Visit  Medication Sig Dispense Refill   acetaminophen  (TYLENOL ) 500 MG tablet Take 1,000 mg by mouth every 6 (six) hours as needed for moderate pain or headache.     Alirocumab  (PRALUENT ) 75 MG/ML SOAJ INJECT 75 MG UNDER THE SKIN EVERY 14 DAYS 6 mL 1   apixaban  (ELIQUIS ) 5 MG TABS tablet TAKE 1 TABLET BY MOUTH 2 TIMES A DAY 60 tablet 5   diphenhydrAMINE (BENADRYL) 25 MG tablet Take 25 mg by mouth daily as needed for allergies.     ferrous sulfate  325 (65 FE) MG EC tablet Take 1 tablet (325 mg total) by mouth 2 (two) times daily with a meal. 60 tablet 2   fluticasone  (FLONASE ) 50 MCG/ACT nasal spray Place 1 spray into both nostrils daily. 16 mL 2   isosorbide  mononitrate (IMDUR ) 30 MG 24 hr tablet Take 1 tablet (30 mg total) by mouth in the morning and at bedtime. 180 tablet 3   loratadine  (CLARITIN ) 10 MG tablet Take 1 tablet (10 mg total) by mouth  daily. 30 tablet 2   metoprolol  succinate (TOPROL -XL) 25 MG 24 hr tablet TAKE 1 TABLET BY MOUTH DAILY 90 tablet 3   Multiple Vitamin (MULTI-VITAMIN DAILY PO) Take 1 tablet by mouth daily.     nitroGLYCERIN  (NITROSTAT ) 0.4 MG SL tablet Place 1 tablet (0.4 mg total) under the tongue every 5 (five) minutes as needed for chest pain. 25 tablet 3   omeprazole  (PRILOSEC OTC) 20 MG tablet Take 20 mg by mouth daily.     Polyethyl Glycol-Propyl Glycol (SYSTANE OP) Place 1 drop into both eyes daily.     ramipril  (ALTACE ) 5 MG capsule Take 1 capsule (5 mg total) by mouth daily. 90 capsule 3   sertraline   (ZOLOFT ) 50 MG tablet TAKE 1/2 TABLETS BY MOUTH FOR 14 DAYS, THEN 1 TABLET DAILY 90 tablet 1   aspirin  EC 81 MG tablet Take 1 tablet (81 mg total) by mouth daily. Begin 1 tab daily on the day that you begin holding eliquis  prior to your heart cath.  Swallow whole. (Patient not taking: Reported on 03/28/2024)     No current facility-administered medications on file prior to visit.    Review of Systems  Constitutional:  Negative for chills, fatigue and fever.  Respiratory:  Positive for shortness of breath (improving). Negative for cough.   Cardiovascular:  Negative for chest pain and palpitations.  Gastrointestinal:  Negative for nausea and vomiting.      Objective:    BP 130/70   Pulse (!) 58   Temp 98.4 F (36.9 C) (Oral)   Ht 6' (1.829 m)   Wt 173 lb 12.8 oz (78.8 kg)   SpO2 95%   BMI 23.57 kg/m  BP Readings from Last 3 Encounters:  03/28/24 130/70  01/24/24 (!) 137/50  01/04/24 (!) 148/82   Wt Readings from Last 3 Encounters:  03/28/24 173 lb 12.8 oz (78.8 kg)  01/24/24 175 lb 2 oz (79.4 kg)  01/04/24 175 lb (79.4 kg)    Physical Exam Vitals reviewed.  Constitutional:      Appearance: He is well-developed.  Cardiovascular:     Rate and Rhythm: Regular rhythm.     Heart sounds: Normal heart sounds.  Pulmonary:     Effort: Pulmonary effort is normal. No respiratory distress.     Breath sounds: Normal breath sounds. No wheezing, rhonchi or rales.  Skin:    General: Skin is warm and dry.  Neurological:     Mental Status: He is alert.  Psychiatric:        Speech: Speech normal.        Behavior: Behavior normal.

## 2024-03-28 NOTE — Patient Instructions (Signed)
 Please consider GI consult, especially if anemia does not completely resolve.  I worry about blood loss from the gut. Nice to see you today

## 2024-03-28 NOTE — Assessment & Plan Note (Signed)
 No overt symptoms of GI bleeding.  Discussed longstanding anemia and strongly advised consult with gastroenterology.  He politely declines.  Fatigue, dyspnea improved.  Continue ferrous sulfate  325 mg twice daily encouraged him to take iron supplement with a small glass of orange juice.  Continue B12 supplementation.  Labs in 2 months time.

## 2024-04-09 ENCOUNTER — Other Ambulatory Visit: Payer: Self-pay

## 2024-04-09 DIAGNOSIS — I2510 Atherosclerotic heart disease of native coronary artery without angina pectoris: Secondary | ICD-10-CM

## 2024-04-09 MED ORDER — PRALUENT 75 MG/ML ~~LOC~~ SOAJ: 75.0000 mg | SUBCUTANEOUS | 3 refills | Status: AC

## 2024-04-09 NOTE — Telephone Encounter (Signed)
 Alirocumab  (PRALUENT )  LF 10/10/2023 6 ML with 1 RF  LOV 03/28/2024 NOV 10/01/2024

## 2024-04-25 NOTE — Progress Notes (Unsigned)
 Date:  04/26/2024   ID:  Rodney Delacruz, DOB June 13, 1934, MRN [id number]  Patient Location:  [address] [place] [address]   Provider location:   [facility], [place] office  PCP:  Dineen Rollene MATSU, FNP  Cardiologist:  Perla [facility]  Chief Complaint  Patient presents with   Follow-up    Denies cardiac symptoms.    History of Present Illness:   Rodney Delacruz is a 89 yo male with  past medical history of coronary artery disease, prior PCI and bypass surgery in 2008 at [facility],  presenting to [facility] for chest pain on 10/30/2012.  ruled out for MI  Myoview  study that showed no ischemia.  GERD.   prior smoking history, stopped in the 70s,  no diabetes Cardiac catheterization November 2022 Severe three-vessel disease occluded mid left main, severe diffuse disease of RCA, patent LIMA to the LAD, vein graft to OM1, vein graft to right PDA, mid to distal LAD with stent that is occluded, proximal LAD supplied partially by retrograde flow from vein graft to OM1 He presents today for routine followup of his coronary artery disease, atrial flutter  LOV: 2/25 Seen by one of our providers June 2025 On prior clinic visit with myself he reported anginal symptoms on exertion Myoview  ordered showing low risk study, no significant ischemia  Continued chest pain June 2025, noted to be anemic at that time  Anemia noted, HGB 8.1 Dec 29, 2023 Started taking iron BID, last HGB 11.4 Feels better, less shortness of breath, less chest pain  Lab work reviewed A1c 6.1 Total cholesterol 131 LDL 64  Restarted walking program, [place] the park  Reports blood pressure drops in the morning after taking his isosorbide  and metoprolol  Reports he stopped his ramipril  in the morning his blood pressure was running low A1c Prior procedure details reviewed Cath 07/07/21 for angina 1.  Severe underlying three-vessel and left main coronary artery disease with occluded  mid left main and severe diffuse disease in the native right coronary artery.  Patent grafts including LIMA to distal LAD, SVG to OM1 and SVG to right PDA.  The mid to distal LAD has a stent that is occluded.  The proximal LAD is supplied partially by retrograde flow from SVG to OM1.  2.  Left ventricular angiography was not performed.  EF was normal by echo.  Normal left ventricular end-diastolic pressure.   Recommendation for continued medical therapy.  The patient's graft are patent with no significant disease.    Echo 6/22 Echocardiogram Normal cardiac function right and left ventricle, no significant valve disease  History of statin intolerance  Denies any tachycardia concerning for recurrent atrial fib flutter 12/23/2020 in the office , new atrial flutter with controlled ventricular response.   started on Eliquis  5 mg twice daily Cardioversion 02/23/21, back in NSR  Prior statin myalgias on Crestor , Lipitor After missing his Zetia  in the past  Last cardiac catheterization 2009  Stress test February 2014  Past Medical History:  Diagnosis Date   Allergy    Anxiety    Coronary artery disease    a. s/p remote LAD PCI; b. 2008 s/p CABG; c. 06/2021 Cath: LM 154m/d, LAD 60p/m, 165m/d ISR, LCX mild diff dzs, RCA 80ost/p, 50p/m, 57m/d, VG->OM1 nl, VG->RPDA nl, LIMA->dLAD nl-->Med rx; c. 09/2023 MV: No isch/infarct.   Depression    Elevated PSA    Followed by Dr. Bernardo every 6 month   Heart murmur  a. 01/2021 Echo: EF 60-65%, no rwma, nl RV fxn, nl RVSP, mildly dil LA, mild Rodney, mild-mod TR, mild-mod AoV sclerosis w/o stenosis.   Hyperlipidemia    Hypertension    MI (myocardial infarction) (HCC) 08/16/2007   Ulcer    Past Surgical History:  Procedure Laterality Date   CARDIAC CATHETERIZATION  2005, 2009   CARDIOVERSION N/A 02/23/2021   Procedure: CARDIOVERSION;  Surgeon: Perla Evalene PARAS, MD;  Location: [facility] ORS;  Service: Cardiovascular;  Laterality: N/A;   CHOLECYSTECTOMY   2001   CORONARY ANGIOPLASTY  2005   s/p stent placement @ [facility]   CORONARY ARTERY BYPASS GRAFT  2009   [facility]   LEFT HEART CATH AND CORS/GRAFTS ANGIOGRAPHY Left 07/07/2021   Procedure: LEFT HEART CATH AND CORS/GRAFTS ANGIOGRAPHY;  Surgeon: Darron Deatrice LABOR, MD;  Location: [facility] INVASIVE CV LAB;  Service: Cardiovascular;  Laterality: Left;   TONSILLECTOMY AND ADENOIDECTOMY  1942     Current Meds  Medication Sig   acetaminophen  (TYLENOL ) 500 MG tablet Take 1,000 mg by mouth every 6 (six) hours as needed for moderate pain or headache.   Alirocumab  (PRALUENT ) 75 MG/ML SOAJ Inject 1 mL (75 mg total) into the skin every 14 (fourteen) days. INJECT 75 MG UNDER THE SKIN EVERY 14 DAYS   apixaban  (ELIQUIS ) 5 MG TABS tablet TAKE 1 TABLET BY MOUTH 2 TIMES A DAY   diphenhydrAMINE (BENADRYL) 25 MG tablet Take 25 mg by mouth daily as needed for allergies.   ferrous sulfate  325 (65 FE) MG EC tablet Take 1 tablet (325 mg total) by mouth 2 (two) times daily with a meal.   fluticasone  (FLONASE ) 50 MCG/ACT nasal spray Place 1 spray into both nostrils daily.   isosorbide  mononitrate (IMDUR ) 30 MG 24 hr tablet Take 1 tablet (30 mg total) by mouth in the morning and at bedtime.   loratadine  (CLARITIN ) 10 MG tablet Take 1 tablet (10 mg total) by mouth daily.   metoprolol  succinate (TOPROL -XL) 25 MG 24 hr tablet TAKE 1 TABLET BY MOUTH DAILY   Multiple Vitamin (MULTI-VITAMIN DAILY PO) Take 1 tablet by mouth daily.   nitroGLYCERIN  (NITROSTAT ) 0.4 MG SL tablet Place 1 tablet (0.4 mg total) under the tongue every 5 (five) minutes as needed for chest pain.   omeprazole  (PRILOSEC OTC) 20 MG tablet Take 20 mg by mouth daily.   Polyethyl Glycol-Propyl Glycol (SYSTANE OP) Place 1 drop into both eyes daily.   ramipril  (ALTACE ) 5 MG capsule Take 1 capsule (5 mg total) by mouth daily.   sertraline  (ZOLOFT ) 50 MG tablet TAKE 1/2 TABLETS BY MOUTH FOR 14 DAYS, THEN 1 TABLET DAILY     Allergies:   Plavix  [clopidogrel  bisulfate]    Social History   Tobacco Use   Smoking status: Former    Current packs/day: 0.00    Average packs/day: 1 pack/day for 20.0 years (20.0 ttl pk-yrs)    Types: Cigarettes    Start date: 08/15/1956    Quit date: 08/15/1976    Years since quitting: 47.7   Smokeless tobacco: Never  Vaping Use   Vaping status: Never Used  Substance Use Topics   Alcohol use: Yes    Alcohol/week: 0.0 standard drinks of alcohol    Comment: Occasional   Drug use: No     Family Hx: The patient's family history includes Alcohol abuse in his father and paternal grandfather; Diabetes in his sister; Heart attack in his brother; Heart disease in his brother; Stroke in his mother.  ROS:  Please see the history of present illness.    Review of Systems  Constitutional: Negative.   HENT: Negative.    Respiratory: Negative.    Cardiovascular: Negative.   Gastrointestinal: Negative.   Musculoskeletal: Negative.   Neurological: Negative.   All other systems reviewed and are negative.    Labs/Other Tests and Data Reviewed:    Recent Labs: 01/02/2024: ALT 7; BUN 27; Creatinine, Ser 1.33; Potassium 4.7; Sodium 139 01/18/2024: TSH 1.96 03/25/2024: Hemoglobin 11.4; Platelets 286.0   Recent Lipid Panel Lab Results  Component Value Date/Time   CHOL 131 08/01/2023 09:58 AM   CHOL 148 10/01/2012 01:05 PM   TRIG 64.0 08/01/2023 09:58 AM   TRIG 89 10/01/2012 01:05 PM   HDL 53.70 08/01/2023 09:58 AM   HDL 49 10/01/2012 01:05 PM   CHOLHDL 2 08/01/2023 09:58 AM   LDLCALC 64 08/01/2023 09:58 AM   LDLCALC 81 10/01/2012 01:05 PM   LDLDIRECT 124.0 04/23/2019 08:28 AM    Wt Readings from Last 3 Encounters:  04/26/24 173 lb 12.8 oz (78.8 kg)  03/28/24 173 lb 12.8 oz (78.8 kg)  01/24/24 175 lb 2 oz (79.4 kg)     Exam:    BP (!) 142/58   Pulse (!) 54   Ht 6' (1.829 m)   Wt 173 lb 12.8 oz (78.8 kg)   SpO2 96%   BMI 23.57 kg/m  Constitutional:  oriented to person, place, and time. No distress.  HENT:  Head:  Normocephalic and atraumatic.  Eyes:  no discharge. No scleral icterus.  Neck: Normal range of motion. Neck supple. No JVD present.  Cardiovascular: Normal rate, regular rhythm, normal heart sounds and intact distal pulses. Exam reveals no gallop and no friction rub. No edema No murmur heard. Pulmonary/Chest: Effort normal and breath sounds normal. No stridor. No respiratory distress.  no wheezes.  no rales.  no tenderness.  Abdominal: Soft.  no distension.  no tenderness.  Musculoskeletal: Normal range of motion.  no  tenderness or deformity.  Neurological:  normal muscle tone. Coordination normal. No atrophy Skin: Skin is warm and dry. No rash noted. not diaphoretic.  Psychiatric:  normal mood and affect. behavior is normal. Thought content normal.    ASSESSMENT & PLAN:    Atrial flutter Maintaining normal sinus rhythm History of cardioversion,  Continue Eliquis  5 twice daily, metoprolol  succinate 25 daily (consider moving to the evening if blood pressure runs low in the morning)  Atherosclerosis of native coronary artery of native heart with stable angina pectoris (HCC) Currently with no symptoms of angina. No further workup at this time. Continue current medication regimen.  Chest pain improved as anemia has resolved  Essential hypertension  isosorbide  up to 30 twice daily Reports he stopped ramipril  as blood pressure was running low in the morning, recommend he try to take this in the evening with his metoprolol  succinate  S/P CABG (coronary artery bypass graft) Stable grafts in 2022 Chronic stable angina  Hyperlipidemia Continue Praluent ,  Cholesterol at goal   Signed, Evalene Lunger, MD  04/26/2024 10:11 AM    [facility] [address], [place], [place] [address]

## 2024-04-26 ENCOUNTER — Encounter: Payer: Self-pay | Admitting: Cardiovascular Disease

## 2024-04-26 ENCOUNTER — Ambulatory Visit: Attending: Cardiovascular Disease | Admitting: Cardiovascular Disease

## 2024-04-26 VITALS — BP 142/58 | HR 54 | Ht 72.0 in | Wt 173.8 lb

## 2024-04-26 DIAGNOSIS — Z951 Presence of aortocoronary bypass graft: Secondary | ICD-10-CM | POA: Diagnosis not present

## 2024-04-26 DIAGNOSIS — E782 Mixed hyperlipidemia: Secondary | ICD-10-CM | POA: Diagnosis not present

## 2024-04-26 DIAGNOSIS — I10 Essential (primary) hypertension: Secondary | ICD-10-CM | POA: Insufficient documentation

## 2024-04-26 DIAGNOSIS — E785 Hyperlipidemia, unspecified: Secondary | ICD-10-CM | POA: Insufficient documentation

## 2024-04-26 DIAGNOSIS — I25118 Atherosclerotic heart disease of native coronary artery with other forms of angina pectoris: Secondary | ICD-10-CM | POA: Diagnosis not present

## 2024-04-26 DIAGNOSIS — I483 Typical atrial flutter: Secondary | ICD-10-CM | POA: Diagnosis not present

## 2024-04-26 DIAGNOSIS — D649 Anemia, unspecified: Secondary | ICD-10-CM | POA: Insufficient documentation

## 2024-04-26 NOTE — Patient Instructions (Signed)

## 2024-05-08 DIAGNOSIS — D2230 Melanocytic nevi of unspecified part of face: Secondary | ICD-10-CM | POA: Diagnosis not present

## 2024-05-08 DIAGNOSIS — D485 Neoplasm of uncertain behavior of skin: Secondary | ICD-10-CM | POA: Diagnosis not present

## 2024-05-08 DIAGNOSIS — D225 Melanocytic nevi of trunk: Secondary | ICD-10-CM | POA: Diagnosis not present

## 2024-05-08 DIAGNOSIS — L570 Actinic keratosis: Secondary | ICD-10-CM | POA: Diagnosis not present

## 2024-05-08 DIAGNOSIS — L988 Other specified disorders of the skin and subcutaneous tissue: Secondary | ICD-10-CM | POA: Diagnosis not present

## 2024-05-08 DIAGNOSIS — D224 Melanocytic nevi of scalp and neck: Secondary | ICD-10-CM | POA: Diagnosis not present

## 2024-05-15 DIAGNOSIS — L820 Inflamed seborrheic keratosis: Secondary | ICD-10-CM | POA: Diagnosis not present

## 2024-05-16 ENCOUNTER — Other Ambulatory Visit: Payer: Self-pay

## 2024-05-16 DIAGNOSIS — F32A Depression, unspecified: Secondary | ICD-10-CM

## 2024-05-16 DIAGNOSIS — I10 Essential (primary) hypertension: Secondary | ICD-10-CM

## 2024-05-16 MED ORDER — SERTRALINE HCL 50 MG PO TABS: ORAL_TABLET | ORAL | 1 refills | Status: AC

## 2024-05-16 MED ORDER — RAMIPRIL 5 MG PO CAPS: 5.0000 mg | ORAL_CAPSULE | Freq: Every day | ORAL | 3 refills | Status: AC

## 2024-05-28 ENCOUNTER — Other Ambulatory Visit (INDEPENDENT_AMBULATORY_CARE_PROVIDER_SITE_OTHER)

## 2024-05-28 DIAGNOSIS — D649 Anemia, unspecified: Secondary | ICD-10-CM

## 2024-05-28 LAB — B12 AND FOLATE PANEL
Folate: >22.9 ng/mL (ref >5.9)
Vitamin B-12: 786 pg/mL (ref 211–911)

## 2024-05-28 LAB — CBC WITH DIFFERENTIAL/PLATELET
Basophils Absolute: 0.1 K/uL (ref 0.0–0.1)
Basophils Relative: 0.8 % (ref 0.0–3.0)
Eosinophils Absolute: 0.5 K/uL (ref 0.0–0.7)
Eosinophils Relative: 7.2 % — ABNORMAL HIGH (ref 0.0–5.0)
HCT: 35.3 % — ABNORMAL LOW (ref 39.0–52.0)
Hemoglobin: 11.6 g/dL — ABNORMAL LOW (ref 13.0–17.0)
Lymphocytes Relative: 20.5 % (ref 12.0–46.0)
Lymphs Abs: 1.3 K/uL (ref 0.7–4.0)
MCHC: 32.8 g/dL (ref 30.0–36.0)
MCV: 101.1 fl — ABNORMAL HIGH (ref 78.0–100.0)
Monocytes Absolute: 0.2 K/uL (ref 0.1–1.0)
Monocytes Relative: 3.5 % (ref 3.0–12.0)
Neutro Abs: 4.2 K/uL (ref 1.4–7.7)
Neutrophils Relative %: 68 % (ref 43.0–77.0)
Platelets: 266 K/uL (ref 150.0–400.0)
RBC: 3.49 Mil/uL — ABNORMAL LOW (ref 4.22–5.81)
RDW: 14.7 % (ref 11.5–15.5)
WBC: 6.2 K/uL (ref 4.0–10.5)

## 2024-05-28 LAB — IBC + FERRITIN
Ferritin: 24.2 ng/mL (ref 22.0–322.0)
Iron: 96 ug/dL (ref 42–165)
Saturation Ratios: 30.5 % (ref 20.0–50.0)
TIBC: 315 ug/dL (ref 250.0–450.0)
Transferrin: 225 mg/dL (ref 212.0–360.0)

## 2024-06-03 ENCOUNTER — Ambulatory Visit: Payer: Self-pay | Admitting: Family

## 2024-06-03 DIAGNOSIS — D649 Anemia, unspecified: Secondary | ICD-10-CM

## 2024-06-07 DIAGNOSIS — Z23 Encounter for immunization: Secondary | ICD-10-CM | POA: Diagnosis not present

## 2024-06-16 ENCOUNTER — Other Ambulatory Visit: Payer: Self-pay | Admitting: Family

## 2024-06-16 DIAGNOSIS — D649 Anemia, unspecified: Secondary | ICD-10-CM

## 2024-06-29 ENCOUNTER — Other Ambulatory Visit: Payer: Self-pay | Admitting: Family

## 2024-06-29 DIAGNOSIS — D649 Anemia, unspecified: Secondary | ICD-10-CM

## 2024-07-01 ENCOUNTER — Other Ambulatory Visit: Payer: Self-pay | Admitting: Cardiovascular Disease

## 2024-07-01 DIAGNOSIS — I483 Typical atrial flutter: Secondary | ICD-10-CM

## 2024-07-02 NOTE — Telephone Encounter (Signed)
 open in error

## 2024-07-04 ENCOUNTER — Ambulatory Visit: Admitting: Family

## 2024-07-08 DIAGNOSIS — H43813 Vitreous degeneration, bilateral: Secondary | ICD-10-CM | POA: Diagnosis not present

## 2024-07-08 DIAGNOSIS — Z961 Presence of intraocular lens: Secondary | ICD-10-CM | POA: Diagnosis not present

## 2024-07-08 DIAGNOSIS — H40053 Ocular hypertension, bilateral: Secondary | ICD-10-CM | POA: Diagnosis not present

## 2024-07-08 DIAGNOSIS — H353131 Nonexudative age-related macular degeneration, bilateral, early dry stage: Secondary | ICD-10-CM | POA: Diagnosis not present

## 2024-08-01 ENCOUNTER — Other Ambulatory Visit: Payer: Self-pay | Admitting: Family

## 2024-08-01 ENCOUNTER — Telehealth: Payer: Self-pay | Admitting: *Deleted

## 2024-08-01 DIAGNOSIS — R899 Unspecified abnormal finding in specimens from other organs, systems and tissues: Secondary | ICD-10-CM

## 2024-08-01 DIAGNOSIS — J309 Allergic rhinitis, unspecified: Secondary | ICD-10-CM

## 2024-08-01 NOTE — Telephone Encounter (Signed)
 Please place future lab order(s) for lab appt

## 2024-08-05 ENCOUNTER — Other Ambulatory Visit (INDEPENDENT_AMBULATORY_CARE_PROVIDER_SITE_OTHER)

## 2024-08-05 DIAGNOSIS — R899 Unspecified abnormal finding in specimens from other organs, systems and tissues: Secondary | ICD-10-CM | POA: Diagnosis not present

## 2024-08-05 LAB — CBC WITH DIFFERENTIAL/PLATELET
Basophils Absolute: 0.1 K/uL (ref 0.0–0.1)
Basophils Relative: 1.1 % (ref 0.0–3.0)
Eosinophils Absolute: 0.4 K/uL (ref 0.0–0.7)
Eosinophils Relative: 6.4 % — ABNORMAL HIGH (ref 0.0–5.0)
HCT: 35 % — ABNORMAL LOW (ref 39.0–52.0)
Hemoglobin: 11.5 g/dL — ABNORMAL LOW (ref 13.0–17.0)
Lymphocytes Relative: 17.6 % (ref 12.0–46.0)
Lymphs Abs: 1.1 K/uL (ref 0.7–4.0)
MCHC: 33 g/dL (ref 30.0–36.0)
MCV: 101 fl — ABNORMAL HIGH (ref 78.0–100.0)
Monocytes Absolute: 0.3 K/uL (ref 0.1–1.0)
Monocytes Relative: 5.1 % (ref 3.0–12.0)
Neutro Abs: 4.5 K/uL (ref 1.4–7.7)
Neutrophils Relative %: 69.8 % (ref 43.0–77.0)
Platelets: 256 K/uL (ref 150.0–400.0)
RBC: 3.46 Mil/uL — ABNORMAL LOW (ref 4.22–5.81)
RDW: 14.9 % (ref 11.5–15.5)
WBC: 6.4 K/uL (ref 4.0–10.5)

## 2024-08-06 LAB — IRON,TIBC AND FERRITIN PANEL
%SAT: 33 % (ref 20–48)
Ferritin: 28 ng/mL (ref 24–380)
Iron: 98 ug/dL (ref 50–180)
TIBC: 299 ug/dL (ref 250–425)

## 2024-08-06 NOTE — Progress Notes (Signed)
 "    Ruel Kung MD, MRCP(U.K) [address]  [place], [place] [address]  Main: [phone] Fax: [phone]   Gastroenterology Consultation  Referring Provider:     Dineen Rollene MATSU, NP Primary Care Physician:  Dineen Rollene MATSU, NP Primary Gastroenterologist:  Dr. Ruel Kung  Reason for Consultation:     Normocytic anemia        HPI:  Rodney Delacruz is a 89 y.o. y/o male referred for consultation & management  by  Dineen Rollene MATSU, NP.    History of Present Illness Rodney Delacruz is an 89 year old male with longstanding anemia who presents for evaluation of low blood count and possible iron deficiency.  He is currently taking iron supplementation. Anemia has been present for many years.  He denies visible blood loss, including in the stool. He is unsure about blood in the urine. He does not experience regular nosebleeds, but occasionally has mild bleeding when blowing his nose forcefully. He does not use NSAIDs or other over-the-counter analgesics. No history of gastric bypass surgery.  Current medications include Eliquis  twice daily. Last colonoscopy was performed around age 16.  He has acid reflux and previously discontinued medication due to adverse effects. Pepcid worsened his stomach symptoms. Occasionally experiences nausea with eating, but this is not persistent or severe.  08/06/2024 hemoglobin 11.5 g with eosinophil count of 6.4% MCV 101, ferritin 28 03/25/2024 ferritin 21.4, hemoglobin 11.4 g B12 folate normal. Follows with cardiology history of CAD bypass  Past Medical History:  Diagnosis Date   AP (angina pectoris) (CMS-HCC)    Atrial fibrillation , unspecified (CMS-HCC)    CAD (coronary artery disease)    s/p CABG   GERD (gastroesophageal reflux disease)    Hyperlipidemia, unspecified, unspecified    Palpitations     Past Surgical History:  Procedure Laterality Date   CHOLECYSTECTOMY     CORONARY ARTERY BYPASS GRAFT      Prior to  Admission medications  Medication Sig Taking? Last Dose  acetaminophen  (TYLENOL ) 500 MG tablet Take 1,000 mg by mouth Yes Taking  atorvastatin  (LIPITOR) 40 MG tablet Take 40 mg by mouth once daily. Yes Taking  diphenhydrAMINE (BENADRYL) 25 mg tablet Take 25 mg by mouth Yes Taking  ELIQUIS  5 mg tablet Take 5 mg by mouth 2 (two) times daily Yes Taking  ferrous sulfate  325 (65 FE) MG EC tablet Take 325 mg by mouth 2 (two) times daily with meals Yes Taking  fluticasone  propionate (FLONASE ) 50 mcg/actuation nasal spray SPRAY 1 SPRAY IN EACH NOSTRIL ONCE DAILY Yes Taking  isosorbide  mononitrate (IMDUR ) 30 MG ER tablet Take 30 mg by mouth Yes Taking  loratadine  (CLARITIN ) 10 mg tablet Take 10 mg by mouth once daily Yes Taking  metoprolol  succinate (TOPROL -XL) 25 MG XL tablet Take 25 mg by mouth daily.   Yes Taking  multivitamin tablet Take 1 tablet by mouth daily.   Yes Taking  nitroglycerin  (NITROSTAT ) 0.4 MG SL tablet Place 0.4 mg under the tongue every 5 (five) minutes as needed. May take up to 3 doses.  Yes PRN Not Currently Taking  PRALUENT  PEN 75 mg/mL pen injector  Yes Taking  ramipril  (ALTACE ) 5 MG capsule Take 2.5 mg by mouth daily.   Yes Taking  sertraline  (ZOLOFT ) 50 MG tablet TAKE 1/2 TABLETS BY MOUTH FOR 14 DAYS, THEN 1 TABLET DAILY Yes Taking  aspirin  81 MG chewable tablet Take 81 mg by mouth daily.   Patient not taking: Reported on 08/06/2024  Not Taking  buPROPion (WELLBUTRIN XL) 300 MG XL tablet Take 300 mg by mouth daily.   Patient not taking: Reported on 08/06/2024  Not Taking  lansoprazole  (PREVACID ) 30 MG DR capsule Take 30 mg by mouth daily.   Patient not taking: Reported on 08/06/2024  Not Taking  lovastatin  (MEVACOR ) 40 MG tablet Take 40 mg by mouth daily with dinner.   Patient not taking: Reported on 08/06/2024  Not Taking  omeprazole  (PRILOSEC) 20 MG DR capsule Take 20 mg by mouth once daily. Patient not taking: Reported on 08/06/2024  Not Taking  oxyCODONE (ROXICODONE) 5  MG immediate release tablet Take 1 to 2 tablets every 4 to 6 hours if needed for pain. Patient not taking: Reported on 03/09/2017  Not Taking    No family history on file.   Social History   Tobacco Use   Smoking status: Former   Smokeless tobacco: Never    Allergies as of 08/06/2024 - Reviewed 08/06/2024  Allergen Reaction Noted   Clopidogrel  bisulfate Rash 02/04/2015   Lescol [fluvastatin] Unknown    Pravachol [pravastatin] Other (See Comments)    Zocor [simvastatin] Other (See Comments)     Review of Systems:    All systems reviewed and negative except where noted in HPI.   Physical Exam: BP (!) 155/73 (BP Location: Left upper arm, Patient Position: Sitting, BP Cuff Size: Adult)   Pulse 57   Ht 182.9 cm (6')   Wt 80.6 kg (177 lb 9.6 oz)   BMI 24.09 kg/m  No LMP for male patient. Psych:  Alert and cooperative. Normal mood and affect. General:   Alert,  Well-developed, well-nourished, pleasant and cooperative in NAD Head:  Normocephalic and atraumatic. Eyes:  Sclera clear, no icterus.   Conjunctiva pink. Ears:  Normal auditory acuity.   Neurologic:  Alert and oriented x3;  grossly normal neurologically. Psych:  Alert and cooperative. Normal mood and affect.  Imaging Studies: No results found.  Assessment and Plan:  Rodney Delacruz is a 89 y.o. y/o male has been referred for normocytic anemia.  The anemia has been chronic the iron studies do not give a clear picture of iron deficiency the ferritin has been a bit low in the past but his anemia has been longstanding.  The main question is should we proceed with endoscopic evaluation or not I gave him the various options we discussed increased risk of endoscopy procedures related to anesthesia as 3 gets older.  I did explain that if the anemia is more likely due to a bone marrow issue or anemia of chronic disease or could be a combination.  I explained that an option would be to be evaluated by hematology and determine  how strongly it is felt that endoscopic intervention would be needed and if felt that the burden of the anemia is predominantly iron deficiency then we could consider endoscopic evaluation.  I will refer him to Dr. Onnie.   Follow up in after hematology evaluation  Dr Ruel Kung MD,MRCP(U.K)  "

## 2024-08-14 ENCOUNTER — Ambulatory Visit: Payer: Self-pay | Admitting: Family

## 2024-08-22 ENCOUNTER — Inpatient Hospital Stay: Attending: Oncology | Admitting: Oncology

## 2024-08-22 ENCOUNTER — Inpatient Hospital Stay

## 2024-08-22 ENCOUNTER — Encounter: Payer: Self-pay | Admitting: Oncology

## 2024-08-22 VITALS — BP 138/62 | HR 64 | Temp 96.4°F | Resp 18 | Wt 174.7 lb

## 2024-08-22 DIAGNOSIS — D649 Anemia, unspecified: Secondary | ICD-10-CM | POA: Diagnosis not present

## 2024-08-22 DIAGNOSIS — N189 Chronic kidney disease, unspecified: Secondary | ICD-10-CM | POA: Insufficient documentation

## 2024-08-22 DIAGNOSIS — N1831 Chronic kidney disease, stage 3a: Secondary | ICD-10-CM

## 2024-08-22 LAB — CMP (CANCER CENTER ONLY)
ALT: 13 U/L (ref 0–44)
AST: 20 U/L (ref 15–41)
Albumin: 4.4 g/dL (ref 3.5–5.0)
Alkaline Phosphatase: 83 U/L (ref 38–126)
Anion gap: 10 (ref 5–15)
BUN: 25 mg/dL — ABNORMAL HIGH (ref 8–23)
CO2: 25 mmol/L (ref 22–32)
Calcium: 9.5 mg/dL (ref 8.9–10.3)
Chloride: 105 mmol/L (ref 98–111)
Creatinine: 1.26 mg/dL — ABNORMAL HIGH (ref 0.61–1.24)
GFR, Estimated: 55 mL/min — ABNORMAL LOW
Glucose, Bld: 97 mg/dL (ref 70–99)
Potassium: 4.9 mmol/L (ref 3.5–5.1)
Sodium: 140 mmol/L (ref 135–145)
Total Bilirubin: 0.3 mg/dL (ref 0.0–1.2)
Total Protein: 7.4 g/dL (ref 6.5–8.1)

## 2024-08-22 LAB — CBC WITH DIFFERENTIAL/PLATELET
Abs Immature Granulocytes: 0.04 K/uL (ref 0.00–0.07)
Basophils Absolute: 0.1 K/uL (ref 0.0–0.1)
Basophils Relative: 1 %
Eosinophils Absolute: 0.3 K/uL (ref 0.0–0.5)
Eosinophils Relative: 4 %
HCT: 35.2 % — ABNORMAL LOW (ref 39.0–52.0)
Hemoglobin: 11.5 g/dL — ABNORMAL LOW (ref 13.0–17.0)
Immature Granulocytes: 1 %
Lymphocytes Relative: 16 %
Lymphs Abs: 1.3 K/uL (ref 0.7–4.0)
MCH: 33.3 pg (ref 26.0–34.0)
MCHC: 32.7 g/dL (ref 30.0–36.0)
MCV: 102 fL — ABNORMAL HIGH (ref 80.0–100.0)
Monocytes Absolute: 0.6 K/uL (ref 0.1–1.0)
Monocytes Relative: 7 %
Neutro Abs: 5.8 K/uL (ref 1.7–7.7)
Neutrophils Relative %: 71 %
Platelets: 243 K/uL (ref 150–400)
RBC: 3.45 MIL/uL — ABNORMAL LOW (ref 4.22–5.81)
RDW: 13.5 % (ref 11.5–15.5)
WBC: 8.1 K/uL (ref 4.0–10.5)
nRBC: 0 % (ref 0.0–0.2)

## 2024-08-22 LAB — RETIC PANEL
Immature Retic Fract: 8.1 % (ref 2.3–15.9)
RBC.: 3.49 MIL/uL — ABNORMAL LOW (ref 4.22–5.81)
Retic Count, Absolute: 41.5 K/uL (ref 19.0–186.0)
Retic Ct Pct: 1.2 % (ref 0.4–3.1)
Reticulocyte Hemoglobin: 36.4 pg

## 2024-08-22 LAB — FERRITIN: Ferritin: 80 ng/mL (ref 24–336)

## 2024-08-22 LAB — LACTATE DEHYDROGENASE: LDH: 258 U/L — ABNORMAL HIGH (ref 105–235)

## 2024-08-22 LAB — IRON AND TIBC
Iron: 97 ug/dL (ref 45–182)
Saturation Ratios: 30 % (ref 17.9–39.5)
TIBC: 328 ug/dL (ref 250–450)
UIBC: 230 ug/dL

## 2024-08-22 LAB — TSH: TSH: 1.81 u[IU]/mL (ref 0.350–4.500)

## 2024-08-22 NOTE — Progress Notes (Signed)
 " Hematology/Oncology Consult note [phone] [phone]        REFERRING PROVIDER: Therisa Bi, MD   CHIEF COMPLAINTS/REASON FOR VISIT:  Evaluation of normocytic anemia   ASSESSMENT & PLAN:   Normocytic anemia Chronic normocytic anemia, possibly secondary to chronic kidney disease. Rule out other etiologies.  Check CBC, smear, iron TIBC ferritin, reticulocyte panel, multiple myeloma panel, light chain ratio, LDH, haptoglobin, TSH.  Lab Results  Component Value Date   HGB 11.5 (L) 08/22/2024   TIBC 328 08/22/2024   IRONPCTSAT 30 08/22/2024   FERRITIN 80 08/22/2024    Hemoglobin is acceptable level in the context of chronic kidney disease. We discussed about option of continuing oral iron supplementation, advised adding vitamin C to improve iron absorption.  Alternative option of IV Venofer treatments to further improve iron stores.  Rationale potential side effects were reviewed and discussed with patient. Shared decision was made to continue oral iron supplementation for now, if in the future hemoglobin progressively declines despite oral iron supplementation, consider IV iron.  We also discussed about small possibility of underlying bone marrow disorders, ie myelodysplastic syndrome.  Given his age, mild anemia, I recommend observation and hold off bone marrow biopsy.  He agrees with the plan.  Chronic kidney disease Avoid nephrotoxins. Check multiple myeloma panel and maturation.   Orders Placed This Encounter  Procedures   Iron and TIBC    Standing Status:   Future    Number of Occurrences:   1    Expected Date:   08/22/2024    Expiration Date:   11/20/2024   Ferritin    Standing Status:   Future    Number of Occurrences:   1    Expected Date:   08/22/2024    Expiration Date:   11/20/2024   CBC with Differential/Platelet    Standing Status:   Future    Number of Occurrences:   1    Expected Date:   08/22/2024    Expiration Date:   11/20/2024    Retic Panel    Standing Status:   Future    Number of Occurrences:   1    Expected Date:   08/22/2024    Expiration Date:   11/20/2024   Multiple Myeloma Panel (SPEP&IFE w/QIG)    Standing Status:   Future    Number of Occurrences:   1    Expected Date:   08/22/2024    Expiration Date:   11/20/2024   Kappa/lambda light chains    Standing Status:   Future    Number of Occurrences:   1    Expected Date:   08/22/2024    Expiration Date:   11/20/2024   Lactate dehydrogenase    Standing Status:   Future    Number of Occurrences:   1    Expected Date:   08/22/2024    Expiration Date:   11/20/2024   Haptoglobin    Standing Status:   Future    Number of Occurrences:   1    Expected Date:   08/22/2024    Expiration Date:   11/20/2024   TSH    Standing Status:   Future    Number of Occurrences:   1    Expected Date:   08/22/2024    Expiration Date:   11/20/2024   Retic Panel    Standing Status:   Future    Expected Date:   08/22/2024    Expiration Date:   08/22/2025  CMP (Cancer Center only)    Standing Status:   Future    Number of Occurrences:   1    Expected Date:   08/22/2024    Expiration Date:   08/22/2025   Follow-up to be determined All questions were answered. The patient knows to call the clinic with any problems, questions or concerns.  Zelphia Cap, MD, PhD [facility] Hematology Oncology 08/22/2024   HISTORY OF PRESENTING ILLNESS:   Rodney Delacruz is a  89 y.o.  male with PMH listed below was seen in consultation at the request of  Therisa Bi, MD  for evaluation of normocytic anemia  Discussed the use of AI scribe software for clinical note transcription with the patient, who gave verbal consent to proceed.   Chronic anemia has been present for several years, with hemoglobin levels declining from approximately 13 g/dL in 7983 to 88-87 g/dL between 7981 and 7977. In May 2025, hemoglobin decreased further to 8-9 g/dL, coinciding with laboratory evidence of iron deficiency. Oral iron supplementation  was initiated, resulting in improvement of hemoglobin to 11.6 g/dL in October 2025 and 88.4 g/dL in December 7974.  He currently feels well, reports frequent ambulation, and notes significant improvement in functional status compared to early 2025, when he experienced marked fatigue and diffuse pain. He denies chest pain, dyspnea, peripheral edema, or overt gastrointestinal bleeding. Stools are dark, attributed to iron supplementation, and bowel movements are regular.  He takes oral iron twice daily   A slightly decreased filtration rate was noted in May 2025, though creatinine remains within normal limits. He is not aware of any prior diagnosis of kidney disease and does not use NSAIDs, relying on Tylenol  for pain control.   MEDICAL HISTORY:  Past Medical History:  Diagnosis Date   Allergy    Anxiety    Coronary artery disease    a. s/p remote LAD PCI; b. 2008 s/p CABG; c. 06/2021 Cath: LM 173m/d, LAD 60p/m, 135m/d ISR, LCX mild diff dzs, RCA 80ost/p, 50p/m, 64m/d, VG->OM1 nl, VG->RPDA nl, LIMA->dLAD nl-->Med rx; c. 09/2023 MV: No isch/infarct.   Depression    Elevated PSA    Followed by Dr. Bernardo every 6 month   Heart murmur    a. 01/2021 Echo: EF 60-65%, no rwma, nl RV fxn, nl RVSP, mildly dil LA, mild MR, mild-mod TR, mild-mod AoV sclerosis w/o stenosis.   Hyperlipidemia    Hypertension    MI (myocardial infarction) (HCC) 08/16/2007   Ulcer     SURGICAL HISTORY: Past Surgical History:  Procedure Laterality Date   CARDIAC CATHETERIZATION  2005, 2009   CARDIOVERSION N/A 02/23/2021   Procedure: CARDIOVERSION;  Surgeon: Perla Evalene PARAS, MD;  Location: [facility] ORS;  Service: Cardiovascular;  Laterality: N/A;   CHOLECYSTECTOMY  2001   CORONARY ANGIOPLASTY  2005   s/p stent placement @ [facility]   CORONARY ARTERY BYPASS GRAFT  2009   [facility]   LEFT HEART CATH AND CORS/GRAFTS ANGIOGRAPHY Left 07/07/2021   Procedure: LEFT HEART CATH AND CORS/GRAFTS ANGIOGRAPHY;  Surgeon: Darron Deatrice LABOR,  MD;  Location: [facility] INVASIVE CV LAB;  Service: Cardiovascular;  Laterality: Left;   TONSILLECTOMY AND ADENOIDECTOMY  1942    SOCIAL HISTORY: Social History   Socioeconomic History   Marital status: Married    Spouse name: Sharlet   Number of children: 2   Years of education: 12   Highest education level: Not on file  Occupational History   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 Pidcock grew up in [place], [place]. He currently lives in [place] with his wife, Sharlet, of 14 years. He was married previously to his high school sweetheart. They were married for 33 years. He widowed in 1993. They had two son Laurier and Radley). Mr. Prashad sons live locally [facility] and [place]). He worked at [employer] and retired from [employer]. He was there for 20 years. He worked in chiropodist. He enjoys fishing, reading, watching TV on his spare time. He also enjoys walking and visiting friends.   Social Drivers of Health   Tobacco Use: Medium Risk (08/22/2024)   Patient History    Smoking Tobacco Use: Former    Smokeless Tobacco Use: Never    Passive Exposure: Not on file  Financial Resource Strain: Low Risk  (08/06/2024)   Received from [facility]   Overall Financial Resource Strain (CARDIA)    Difficulty of Paying Living Expenses: Not hard at all  Food Insecurity: No Food Insecurity  (08/06/2024)   Received from [facility]   Epic    Within the past 12 months, you worried that your food would run out before you got the money to buy more.: Never true    Within the past 12 months, the food you bought just didn't last and you didn't have money to get more.: Never true  Transportation Needs: No Transportation Needs (08/06/2024)   Received from [facility] - Transportation    In the past 12 months, has lack of transportation kept you from medical appointments or from getting medications?: No    Lack of Transportation (Non-Medical): No  Physical Activity: Not on file  Stress: Not on file  Social Connections: Not on file  Intimate Partner Violence: Not on file  Depression (PHQ2-9): Low Risk (03/28/2024)   Depression (PHQ2-9)    PHQ-2 Score: 0  Alcohol Screen: Not on file  Housing: Low Risk  (08/06/2024)   [facility] from [facility]   Epic    In the last 12 months, was there a time when you were not able to pay the mortgage or rent on time?: No    In the past 12 months, how many times have you moved where you were living?: 0    At any time in the past 12 months, were you homeless or living in a shelter (including now)?: No  Utilities: Not At Risk (08/06/2024)   Received from [facility]   Epic    In the past 12 months has the electric, gas, oil, or water company threatened to shut off services in your home?: No  Health Literacy: Not on file    FAMILY HISTORY: Family History  Problem Relation Age of Onset   Heart  attack Brother    Heart disease Brother        CAD   Alcohol abuse Father    Diabetes Sister    Stroke Mother    Alcohol abuse Paternal Grandfather     ALLERGIES:  is allergic to plavix  [clopidogrel  bisulfate].  MEDICATIONS:  Current Outpatient Medications  Medication Sig Dispense Refill   acetaminophen  (TYLENOL ) 500 MG tablet Take 1,000 mg by mouth every 6 (six) hours  as needed for moderate pain or headache.     diphenhydrAMINE (BENADRYL) 25 MG tablet Take 25 mg by mouth daily as needed for allergies.     ELIQUIS  5 MG TABS tablet TAKE 1 TABLET BY MOUTH 2 TIMES A DAY 60 tablet 5   ferrous sulfate  325 (65 FE) MG EC tablet Take 1 tablet (325 mg total) by mouth 2 (two) times daily with a meal. 60 tablet 2   fluticasone  (FLONASE ) 50 MCG/ACT nasal spray SPRAY 1 SPRAY IN EACH NOSTRIL ONCE DAILY 16 mL 2   isosorbide  mononitrate (IMDUR ) 30 MG 24 hr tablet Take 1 tablet (30 mg total) by mouth in the morning and at bedtime. 180 tablet 3   loratadine  (CLARITIN ) 10 MG tablet Take 1 tablet (10 mg total) by mouth daily. 30 tablet 2   metoprolol  succinate (TOPROL -XL) 25 MG 24 hr tablet TAKE 1 TABLET BY MOUTH DAILY 90 tablet 3   Multiple Vitamin (MULTI-VITAMIN DAILY PO) Take 1 tablet by mouth daily.     nitroGLYCERIN  (NITROSTAT ) 0.4 MG SL tablet Place 1 tablet (0.4 mg total) under the tongue every 5 (five) minutes as needed for chest pain. 25 tablet 3   omeprazole  (PRILOSEC OTC) 20 MG tablet Take 20 mg by mouth daily.     Polyethyl Glycol-Propyl Glycol (SYSTANE OP) Place 1 drop into both eyes daily.     ramipril  (ALTACE ) 5 MG capsule Take 1 capsule (5 mg total) by mouth daily. 90 capsule 3   sertraline  (ZOLOFT ) 50 MG tablet TAKE 1/2 TABLETS BY MOUTH FOR 14 DAYS, THEN 1 TABLET DAILY 90 tablet 1   No current facility-administered medications for this visit.    Review of Systems  Constitutional:  Negative for appetite change, chills, fatigue and fever.  HENT:   Negative for hearing loss and voice change.   Eyes:  Negative for eye problems.  Respiratory:  Negative for chest tightness and cough.   Cardiovascular:  Negative for chest pain.  Gastrointestinal:  Negative for abdominal distention, abdominal pain and blood in stool.  Endocrine: Negative for hot flashes.  Genitourinary:  Negative for difficulty urinating and frequency.   Musculoskeletal:  Negative for arthralgias.   Skin:  Negative for itching and rash.  Neurological:  Negative for extremity weakness.  Hematological:  Negative for adenopathy.  Psychiatric/Behavioral:  Negative for confusion.    PHYSICAL EXAMINATION:  Vitals:   08/22/24 1359 08/22/24 1408  BP: (!) 149/67 138/62  Pulse: 64   Resp: 18   Temp: (!) 96.4 F (35.8 C)   SpO2: 99%    Filed Weights   08/22/24 1359  Weight: 174 lb 11.2 oz (79.2 kg)    Physical Exam Constitutional:      General: He is not in acute distress. HENT:     Head: Normocephalic and atraumatic.  Eyes:     General: No scleral icterus. Cardiovascular:     Rate and Rhythm: Normal rate and regular rhythm.  Pulmonary:     Effort: Pulmonary effort is normal. No respiratory distress.  Breath sounds: Normal breath sounds. No wheezing.  Abdominal:     General: Bowel sounds are normal. There is no distension.     Palpations: Abdomen is soft.  Musculoskeletal:        General: No deformity. Normal range of motion.     Cervical back: Normal range of motion and neck supple.  Skin:    General: Skin is warm and dry.     Findings: No erythema or rash.  Neurological:     Mental Status: He is alert and oriented to person, place, and time. Mental status is at baseline.  Psychiatric:        Mood and Affect: Mood normal.     LABORATORY DATA:  I have reviewed the data as listed    Latest Ref Rng & Units 08/22/2024    2:45 PM 08/05/2024   10:03 AM 05/28/2024   10:02 AM  CBC  WBC 4.0 - 10.5 K/uL 8.1  6.4  6.2   Hemoglobin 13.0 - 17.0 g/dL 88.4  88.4  88.3   Hematocrit 39.0 - 52.0 % 35.2  35.0  35.3   Platelets 150 - 400 K/uL 243  256.0  266.0       Latest Ref Rng & Units 08/22/2024    2:46 PM 01/02/2024    8:46 AM 12/29/2023    3:46 PM  CMP  Glucose 70 - 99 mg/dL 97  896  87   BUN 8 - 23 mg/dL 25  27  27    Creatinine 0.61 - 1.24 mg/dL 8.73  8.66  8.59   Sodium 135 - 145 mmol/L 140  139  138   Potassium 3.5 - 5.1 mmol/L 4.9  4.7  4.8   Chloride 98 - 111  mmol/L 105  106  105   CO2 22 - 32 mmol/L 25  24  20    Calcium  8.9 - 10.3 mg/dL 9.5  8.9  8.7   Total Protein 6.5 - 8.1 g/dL 7.4  6.7    6.4    Total Bilirubin 0.0 - 1.2 mg/dL 0.3  0.4    Alkaline Phos 38 - 126 U/L 83  71    AST 15 - 41 U/L 20  14    ALT 0 - 44 U/L 13  7        RADIOGRAPHIC STUDIES: I have personally reviewed the radiological images as listed and agreed with the findings in the report. No results found.       "

## 2024-08-22 NOTE — Assessment & Plan Note (Signed)
 Avoid nephrotoxins. Check multiple myeloma panel and maturation.

## 2024-08-22 NOTE — Assessment & Plan Note (Signed)
 Chronic normocytic anemia, possibly secondary to chronic kidney disease. Rule out other etiologies.  Check CBC, smear, iron TIBC ferritin, reticulocyte panel, multiple myeloma panel, light chain ratio, LDH, haptoglobin, TSH.  Lab Results  Component Value Date   HGB 11.5 (L) 08/22/2024   TIBC 328 08/22/2024   IRONPCTSAT 30 08/22/2024   FERRITIN 80 08/22/2024    Hemoglobin is acceptable level in the context of chronic kidney disease. We discussed about option of continuing oral iron supplementation, advised adding vitamin C to improve iron absorption.  Alternative option of IV Venofer treatments to further improve iron stores.  Rationale potential side effects were reviewed and discussed with patient. Shared decision was made to continue oral iron supplementation for now, if in the future hemoglobin progressively declines despite oral iron supplementation, consider IV iron.  We also discussed about small possibility of underlying bone marrow disorders, ie myelodysplastic syndrome.  Given his age, mild anemia, I recommend observation and hold off bone marrow biopsy.  He agrees with the plan.

## 2024-08-23 LAB — KAPPA/LAMBDA LIGHT CHAINS
Kappa free light chain: 67.5 mg/L — ABNORMAL HIGH (ref 3.3–19.4)
Kappa, lambda light chain ratio: 0.45 (ref 0.26–1.65)
Lambda free light chains: 150.8 mg/L — ABNORMAL HIGH (ref 5.7–26.3)

## 2024-08-23 LAB — HAPTOGLOBIN: Haptoglobin: 179 mg/dL (ref 38–329)

## 2024-08-26 LAB — MULTIPLE MYELOMA PANEL, SERUM
Albumin SerPl Elph-Mcnc: 3.9 g/dL (ref 2.9–4.4)
Albumin/Glob SerPl: 1.4 (ref 0.7–1.7)
Alpha 1: 0.2 g/dL (ref 0.0–0.4)
Alpha2 Glob SerPl Elph-Mcnc: 0.8 g/dL (ref 0.4–1.0)
B-Globulin SerPl Elph-Mcnc: 0.9 g/dL (ref 0.7–1.3)
Gamma Glob SerPl Elph-Mcnc: 1 g/dL (ref 0.4–1.8)
Globulin, Total: 2.9 g/dL (ref 2.2–3.9)
IgA: 184 mg/dL (ref 61–437)
IgG (Immunoglobin G), Serum: 1120 mg/dL (ref 603–1613)
IgM (Immunoglobulin M), Srm: 21 mg/dL (ref 15–143)
Total Protein ELP: 6.8 g/dL (ref 6.0–8.5)

## 2024-08-28 ENCOUNTER — Ambulatory Visit: Payer: Self-pay | Admitting: Oncology

## 2024-08-29 ENCOUNTER — Other Ambulatory Visit: Payer: Self-pay

## 2024-08-29 DIAGNOSIS — D649 Anemia, unspecified: Secondary | ICD-10-CM

## 2024-09-11 ENCOUNTER — Other Ambulatory Visit: Payer: Self-pay | Admitting: Cardiovascular Disease

## 2024-10-01 ENCOUNTER — Ambulatory Visit: Admitting: Family

## 2024-11-27 ENCOUNTER — Inpatient Hospital Stay

## 2024-12-11 ENCOUNTER — Inpatient Hospital Stay: Admitting: Oncology
